# Patient Record
Sex: Female | Born: 1965 | Race: Black or African American | Hispanic: No | Marital: Single | Smoking: Never smoker
Health system: Southern US, Community
[De-identification: ages and names within clinical notes are randomized; demographics above are authoritative.]

## PROBLEM LIST (undated history)

## (undated) DIAGNOSIS — R011 Cardiac murmur, unspecified: Secondary | ICD-10-CM

## (undated) DIAGNOSIS — Z87442 Personal history of urinary calculi: Secondary | ICD-10-CM

## (undated) DIAGNOSIS — E785 Hyperlipidemia, unspecified: Secondary | ICD-10-CM

## (undated) DIAGNOSIS — D649 Anemia, unspecified: Secondary | ICD-10-CM

## (undated) DIAGNOSIS — D219 Benign neoplasm of connective and other soft tissue, unspecified: Secondary | ICD-10-CM

## (undated) DIAGNOSIS — F419 Anxiety disorder, unspecified: Secondary | ICD-10-CM

## (undated) DIAGNOSIS — J302 Other seasonal allergic rhinitis: Secondary | ICD-10-CM

## (undated) DIAGNOSIS — N938 Other specified abnormal uterine and vaginal bleeding: Secondary | ICD-10-CM

## (undated) DIAGNOSIS — M1990 Unspecified osteoarthritis, unspecified site: Secondary | ICD-10-CM

## (undated) DIAGNOSIS — T7840XA Allergy, unspecified, initial encounter: Secondary | ICD-10-CM

## (undated) DIAGNOSIS — B019 Varicella without complication: Secondary | ICD-10-CM

## (undated) HISTORY — PX: TONSILLECTOMY AND ADENOIDECTOMY: SUR1326

## (undated) HISTORY — DX: Cardiac murmur, unspecified: R01.1

## (undated) HISTORY — PX: DILATION AND CURETTAGE OF UTERUS: SHX78

## (undated) HISTORY — DX: Unspecified osteoarthritis, unspecified site: M19.90

## (undated) HISTORY — DX: Varicella without complication: B01.9

## (undated) HISTORY — PX: ENDOMETRIAL ABLATION: SHX621

## (undated) HISTORY — PX: PARTIAL HYSTERECTOMY: SHX80

## (undated) HISTORY — DX: Hyperlipidemia, unspecified: E78.5

## (undated) HISTORY — DX: Anxiety disorder, unspecified: F41.9

## (undated) HISTORY — DX: Other seasonal allergic rhinitis: J30.2

## (undated) HISTORY — DX: Other specified abnormal uterine and vaginal bleeding: N93.8

## (undated) HISTORY — DX: Anemia, unspecified: D64.9

## (undated) HISTORY — DX: Morbid (severe) obesity due to excess calories: E66.01

## (undated) HISTORY — PX: ABDOMINAL HYSTERECTOMY: SHX81

## (undated) HISTORY — PX: WISDOM TOOTH EXTRACTION: SHX21

## (undated) HISTORY — DX: Allergy, unspecified, initial encounter: T78.40XA

---

## 1988-05-29 HISTORY — PX: LAPAROSCOPY: SHX197

## 1997-11-04 ENCOUNTER — Emergency Department (HOSPITAL_COMMUNITY): Admission: EM | Admit: 1997-11-04 | Discharge: 1997-11-04 | Payer: Self-pay | Admitting: Internal Medicine

## 1998-04-04 ENCOUNTER — Emergency Department (HOSPITAL_COMMUNITY): Admission: EM | Admit: 1998-04-04 | Discharge: 1998-04-04 | Payer: Self-pay | Admitting: Emergency Medicine

## 1998-05-28 ENCOUNTER — Emergency Department (HOSPITAL_COMMUNITY): Admission: EM | Admit: 1998-05-28 | Discharge: 1998-05-28 | Payer: Self-pay | Admitting: Emergency Medicine

## 1998-08-16 ENCOUNTER — Emergency Department (HOSPITAL_COMMUNITY): Admission: EM | Admit: 1998-08-16 | Discharge: 1998-08-16 | Payer: Self-pay | Admitting: Emergency Medicine

## 2000-12-08 ENCOUNTER — Emergency Department (HOSPITAL_COMMUNITY): Admission: EM | Admit: 2000-12-08 | Discharge: 2000-12-08 | Payer: Self-pay

## 2001-05-23 ENCOUNTER — Other Ambulatory Visit: Admission: RE | Admit: 2001-05-23 | Discharge: 2001-05-23 | Payer: Self-pay | Admitting: Obstetrics and Gynecology

## 2001-05-24 ENCOUNTER — Encounter: Payer: Self-pay | Admitting: Obstetrics and Gynecology

## 2001-05-24 ENCOUNTER — Encounter: Admission: RE | Admit: 2001-05-24 | Discharge: 2001-05-24 | Payer: Self-pay | Admitting: Obstetrics and Gynecology

## 2009-06-24 ENCOUNTER — Emergency Department (HOSPITAL_COMMUNITY): Admission: EM | Admit: 2009-06-24 | Discharge: 2009-06-24 | Payer: Self-pay | Admitting: Emergency Medicine

## 2010-06-19 ENCOUNTER — Encounter: Payer: Self-pay | Admitting: Obstetrics and Gynecology

## 2010-06-24 ENCOUNTER — Inpatient Hospital Stay (HOSPITAL_COMMUNITY)
Admission: AD | Admit: 2010-06-24 | Discharge: 2010-06-25 | Payer: Self-pay | Source: Home / Self Care | Attending: Obstetrics and Gynecology | Admitting: Obstetrics and Gynecology

## 2010-06-24 LAB — URINALYSIS, ROUTINE W REFLEX MICROSCOPIC
Bilirubin Urine: NEGATIVE
Hgb urine dipstick: NEGATIVE
Ketones, ur: NEGATIVE mg/dL
Nitrite: NEGATIVE
Protein, ur: NEGATIVE mg/dL
Specific Gravity, Urine: >1.03 — ABNORMAL HIGH (ref 1.005–1.030)
Urine Glucose, Fasting: NEGATIVE mg/dL
Urobilinogen, UA: 0.2 mg/dL (ref 0.0–1.0)
pH: 5.5 (ref 5.0–8.0)

## 2010-06-24 LAB — DIFFERENTIAL
Basophils Absolute: 0 10*3/uL (ref 0.0–0.1)
Eosinophils Absolute: 0.1 10*3/uL (ref 0.0–0.7)
Lymphocytes Relative: 36 % (ref 12–46)
Lymphs Abs: 2.1 10*3/uL (ref 0.7–4.0)
Neutrophils Relative %: 56 % (ref 43–77)

## 2010-06-24 LAB — CBC
MCV: 76.5 fL — ABNORMAL LOW (ref 78.0–100.0)
Platelets: 426 10*3/uL — ABNORMAL HIGH (ref 150–400)
RBC: 4.63 MIL/uL (ref 3.87–5.11)
WBC: 6 10*3/uL (ref 4.0–10.5)

## 2010-06-24 LAB — WET PREP, GENITAL
Clue Cells Wet Prep HPF POC: NONE SEEN
Trich, Wet Prep: NONE SEEN
Yeast Wet Prep HPF POC: NONE SEEN

## 2010-06-24 LAB — HCG, SERUM, QUALITATIVE: Preg, Serum: NEGATIVE

## 2010-06-24 LAB — POCT PREGNANCY, URINE: Preg Test, Ur: NEGATIVE

## 2010-06-25 LAB — GC/CHLAMYDIA PROBE AMP, GENITAL: Chlamydia, DNA Probe: NEGATIVE

## 2010-08-10 ENCOUNTER — Ambulatory Visit: Payer: Self-pay | Admitting: Obstetrics and Gynecology

## 2010-08-14 LAB — GC/CHLAMYDIA PROBE AMP, GENITAL
Chlamydia, DNA Probe: NEGATIVE
GC Probe Amp, Genital: NEGATIVE

## 2010-08-14 LAB — WET PREP, GENITAL
Trich, Wet Prep: NONE SEEN
Yeast Wet Prep HPF POC: NONE SEEN

## 2010-08-14 LAB — URINALYSIS, ROUTINE W REFLEX MICROSCOPIC
Glucose, UA: NEGATIVE mg/dL
Nitrite: NEGATIVE
Protein, ur: NEGATIVE mg/dL
Urobilinogen, UA: 1 mg/dL (ref 0.0–1.0)

## 2010-08-25 ENCOUNTER — Other Ambulatory Visit: Payer: Self-pay | Admitting: Obstetrics and Gynecology

## 2010-08-25 ENCOUNTER — Ambulatory Visit: Payer: Self-pay | Admitting: Obstetrics and Gynecology

## 2010-08-25 ENCOUNTER — Other Ambulatory Visit: Payer: Self-pay | Admitting: Family Medicine

## 2010-08-25 ENCOUNTER — Other Ambulatory Visit (HOSPITAL_COMMUNITY)
Admission: RE | Admit: 2010-08-25 | Discharge: 2010-08-25 | Disposition: A | Discharge status: Home / Self Care | Payer: Self-pay | Source: Ambulatory Visit | Attending: Obstetrics and Gynecology | Admitting: Obstetrics and Gynecology

## 2010-08-25 DIAGNOSIS — N949 Unspecified condition associated with female genital organs and menstrual cycle: Secondary | ICD-10-CM | POA: Insufficient documentation

## 2010-08-25 DIAGNOSIS — N938 Other specified abnormal uterine and vaginal bleeding: Secondary | ICD-10-CM | POA: Insufficient documentation

## 2010-08-25 DIAGNOSIS — N83209 Unspecified ovarian cyst, unspecified side: Secondary | ICD-10-CM

## 2010-08-25 DIAGNOSIS — D259 Leiomyoma of uterus, unspecified: Secondary | ICD-10-CM

## 2010-08-25 DIAGNOSIS — Z01419 Encounter for gynecological examination (general) (routine) without abnormal findings: Secondary | ICD-10-CM

## 2010-08-25 DIAGNOSIS — Z124 Encounter for screening for malignant neoplasm of cervix: Secondary | ICD-10-CM

## 2010-08-26 ENCOUNTER — Encounter: Payer: Self-pay | Admitting: Obstetrics and Gynecology

## 2010-08-26 LAB — CONVERTED CEMR LAB
Trich, Wet Prep: NONE SEEN
Yeast Wet Prep HPF POC: NONE SEEN

## 2010-08-26 NOTE — Progress Notes (Unsigned)
NAME:  Jenny Cohen, Jenny Cohen NO.:  [id number]  MEDICAL RECORD NO.:  [id number]           PATIENT TYPE:  A  LOCATION:  [facility]                   FACILITY:  [facility]  PHYSICIAN:  Argentina Donovan, MD        DATE OF BIRTH:  June 02, 1965  DATE OF SERVICE:  08/25/2010                                 CLINIC NOTE  The patient is a 45 year old African American female, gravida 1, para 1- 0-0-1,who had a C-section 16 years ago, was living down here, then moved up to [place] and has just come back.  She was seen in January in the emergency room at [facility] with severe abdominal pain.  They did an ultrasound, found a 15-week size uterus.  Her periods have always been heavy and she also had a complex right ovarian cyst at the time. Recently the pain has gotten much worse, especially following her periods she is borderline anemic, but has been on iron with vitamin C for about a year and had a hemoglobin of 11 when she was in the [facility]. Today, we found her abdomen soft, flat, nontender.  No masses or organomegaly.  External genitalia is normal.  BUS within normal limits. Vagina is clean with a positive whiff.  Cervix is clean and nulliparous. The uterus sounded to a depth of 10 cm and endometrial biopsy was then taken as well as a Pap smear.  The patient will be back in 2 weeks to review the biopsy as well as the followup ultrasound and checked on that ovarian cyst.  I am having her get the papers for financial aid today to sell out as when she comes back and this will probably have her to see one of the surgeons.  IMPRESSION:  Fibroid uterus with menorrhagia and abdominopelvic pain especially on the right and complex right ovarian cyst.          ______________________________ Argentina Donovan, MD    PR/MEDQ  D:  08/25/2010  T:  08/26/2010  Job:  [id number]

## 2010-08-31 ENCOUNTER — Ambulatory Visit (HOSPITAL_COMMUNITY)
Admission: RE | Admit: 2010-08-31 | Discharge: 2010-08-31 | Disposition: A | Discharge status: Home / Self Care | Payer: Self-pay | Source: Ambulatory Visit | Attending: Family Medicine | Admitting: Family Medicine

## 2010-08-31 DIAGNOSIS — N949 Unspecified condition associated with female genital organs and menstrual cycle: Secondary | ICD-10-CM | POA: Insufficient documentation

## 2010-08-31 DIAGNOSIS — N938 Other specified abnormal uterine and vaginal bleeding: Secondary | ICD-10-CM | POA: Insufficient documentation

## 2010-08-31 DIAGNOSIS — D259 Leiomyoma of uterus, unspecified: Secondary | ICD-10-CM | POA: Insufficient documentation

## 2010-08-31 DIAGNOSIS — N83209 Unspecified ovarian cyst, unspecified side: Secondary | ICD-10-CM | POA: Insufficient documentation

## 2010-09-12 ENCOUNTER — Ambulatory Visit: Payer: Self-pay | Admitting: Obstetrics and Gynecology

## 2010-09-12 DIAGNOSIS — N949 Unspecified condition associated with female genital organs and menstrual cycle: Secondary | ICD-10-CM

## 2010-09-13 NOTE — Group Therapy Note (Signed)
NAME:  Jenny Cohen, Jenny Cohen NO.:  [id number]  MEDICAL RECORD NO.:  [id number]           PATIENT TYPE:  A  LOCATION[facility]                   FACILITY:  [facility]  PHYSICIAN:  Argentina Donovan, MD        DATE OF BIRTH:  [birth year]  DATE OF SERVICE:  09/12/2010                                 CLINIC NOTE  This is a followup visit of 45 year old African American female, gravida 1, para 1-0-0-1, who had a C-section 16 years ago.  She had an ultrasound done sometime ago and had been followed for extremely heavy bleeding and periods with anemia.  She has been on iron for quite some time.  We did an endometrial biopsy, which was normal and the uterus sounded to 10 cm in depth.  She had a Pap smear, which was normal and an ultrasound followup, which showed a previous complex ovarian cyst, resolved.  The uterus measured 13 x 5 with this one and 15 with the previous one, so the uterus falls well within the limits for endometrial ablation.  I have talked to her about that and gave her some information on that, and I think she would be a good candidate for a hysteroscopy. I am going to have her see one of the surgeons.  The decision on what to do will be left up to him for her and we will have her come back.  She is filling out the papers for financial aid today.          ______________________________ Argentina Donovan, MD    PR/MEDQ  D:  09/12/2010  T:  09/13/2010  Job:  [id number]

## 2010-09-21 ENCOUNTER — Ambulatory Visit (INDEPENDENT_AMBULATORY_CARE_PROVIDER_SITE_OTHER): Payer: Self-pay | Admitting: Obstetrics and Gynecology

## 2010-09-21 DIAGNOSIS — D259 Leiomyoma of uterus, unspecified: Secondary | ICD-10-CM

## 2010-09-21 DIAGNOSIS — N949 Unspecified condition associated with female genital organs and menstrual cycle: Secondary | ICD-10-CM

## 2010-09-21 NOTE — H&P (Signed)
Jenny Cohen, Jenny Cohen NO.:  [id number]  MEDICAL RECORD NO.:  [id number]           PATIENT TYPE:  A  LOCATION:  [facility]                   FACILITY:  [facility]  PHYSICIAN:  Catalina Antigua, MD     DATE OF BIRTH:  01-11-1966  DATE OF SERVICE:                          PRE-OP HISTORY & PHYSICAL  This is a 45 year old G1, P108 with LMP of September 09, 2010 who presents today for surgical consult for management of her dysfunctional uterine bleeding.  The patient has had longstanding history of heavy vaginal bleeding which never required any hospitalization for blood transfusion and presents today requesting medical management.  The patient had endometrial biopsy performed on March 29 which revealed a benign proliferative endometrium.  No atypia or hyperplasia or malignancy identified.  The patient is currently without any other complaints.  PAST MEDICAL HISTORY:  Significant for anemia.  PAST SURGICAL HISTORY:  She has had a C-section in 1995 and a laparoscopy with possible cystectomy in 1993.  PAST OB HISTORY:  She has had 1 C-section.  PAST GYN HISTORY:  She does have fibroid.  She does have a history of ovarian cyst.  She does have a history of abnormal Pap smear in 2008 which was treated with repeat Pap and her most recent Pap in March 2012 was normal.  FAMILY HISTORY:  Significant for hypertension and no family history of cancer in the family.  SOCIAL HISTORY:  She denies smoking or other use of illicit drugs.  She does drink once or twice per week.  REVIEW OF SYSTEMS:  Significant for occasional dysmenorrhea.  PHYSICAL EXAMINATION:  VITAL SIGNS:  Her blood pressure is 107/67, pulse of 74, weight of 117.6 kg, height of 5 feet 5 inches. LUNGS:  Clear to auscultation bilaterally. HEART:  Regular rate and rhythm. ABDOMEN:  Soft, nontender, nondistended but obese. PELVIC:  Normal-appearing vaginal mucosa, normal appearing cervix.  No abnormal bleeding or  discharge.  The patient also had an ultrasound on September 08, 2010 which demonstrated uterus measuring 13.5 x 9.5 x 6.8 cm with multiple uterine fibroids. Endometrium was 1.5 cm but with a probable submucosal fibroid or polyp which measured 2 x 2 x 1.6 cm.  She had a normal right and left ovary and no free fluid.  ASSESSMENT/PLAN:  This is a 45 year old G1, P1 with history of dysfunctional uterine bleeding who presents today requesting medical intervention.  The patient was counseled regarding birth control options for which she is not interested in.  The patient was previously counseled by Dr. Joseph Art with endometrial ablation and this is what she desires.  Risks, benefits, and alternatives of the procedures were explained.  The patient verbalized understanding.  All questions were answered.  The patient will be scheduled for endometrial ablation and will be contacted by surgical scheduler for the day and time of the surgery.          ______________________________ Catalina Antigua, MD    PC/MEDQ  D:  09/21/2010  T:  09/21/2010  Job:  [id number]

## 2010-10-11 ENCOUNTER — Ambulatory Visit (HOSPITAL_COMMUNITY)
Admission: RE | Admit: 2010-10-11 | Discharge: 2010-10-11 | Disposition: A | Discharge status: Home / Self Care | Payer: Self-pay | Source: Ambulatory Visit | Attending: Obstetrics and Gynecology | Admitting: Obstetrics and Gynecology

## 2010-10-11 DIAGNOSIS — N938 Other specified abnormal uterine and vaginal bleeding: Secondary | ICD-10-CM | POA: Insufficient documentation

## 2010-10-11 DIAGNOSIS — N949 Unspecified condition associated with female genital organs and menstrual cycle: Secondary | ICD-10-CM | POA: Insufficient documentation

## 2010-10-11 LAB — CBC
Platelets: 412 10*3/uL — ABNORMAL HIGH (ref 150–400)
RBC: 4.59 MIL/uL (ref 3.87–5.11)
RDW: 17.2 % — ABNORMAL HIGH (ref 11.5–15.5)
WBC: 4.7 10*3/uL (ref 4.0–10.5)

## 2010-10-11 LAB — PREGNANCY, URINE: Preg Test, Ur: NEGATIVE

## 2010-10-12 NOTE — Op Note (Signed)
  Jenny Cohen, ZEITER              ACCOUNT NO.:  [id number]  MEDICAL RECORD NO.:  [id number]           PATIENT TYPE:  O  LOCATION:  [facility]                          [facility]  PHYSICIAN:  Catalina Antigua, MD     DATE OF BIRTH:  11-Jun-1965  DATE OF PROCEDURE:  10/11/2010 DATE OF DISCHARGE:                              OPERATIVE REPORT   PREOPERATIVE DIAGNOSIS:  A 45 year old G1, P1 with history of dysfunctional uterine bleeding requesting surgical intervention with endometrial ablation.  POSTOPERATIVE DIAGNOSIS:  A 45 year old G1, P1 with history of dysfunctional uterine bleeding requesting surgical intervention with endometrial ablation.  PROCEDURE:  Endometrial ablation with hysteroscopy using Thermachoice.  SURGEON:  Catalina Antigua, MD  ASSISTANT:  None.  ANESTHESIA:  General.  ESTIMATED BLOOD LOSS:  Minimal.  FINDINGS:  Uterine cavity sounded to 8 cm.  Both ostia visualized.  COMPLICATIONS:  None.  After informed consent was obtained, the patient was taken to the operating room where anesthesia was induced and found to be adequate. The patient was placed in dorsal lithotomy position and prepped and draped in the usual sterile fashion.  A speculum was placed in the vagina.  Anterior lip of cervix was grasped with single-tooth tenaculum. The cervix was injected in its circumference with a 1%  Nesacaine solution.  The uterine cavity was sounded to 8 cm.  A 2.7 hysteroscope was then introduced to the uterine cavity with the above- noted findings.  The Thermachoice device was then primed and inserted into the uterine cavity and the standard instructions were followed.  When a pressure of 185 mmHg was achieved, endometrial ablation was performed at temperature of 87 degrees for 8 minutes.  Following the ablation cycle, the 2.7 hysteroscope was then reintroduced into the uterine cavity with endometrial lining completely blanched.  All instruments were removed from the  patient's vagina.  Hemostasis was visualized at the tenaculum site.  The patient tolerated the procedure well.  Sponge, lap, and needle count was correct x2.  The patient was transferred to the recovery room in stable condition.     Catalina Antigua, MD     PC/MEDQ  D:  10/11/2010  T:  10/12/2010  Job:  [id number]  Electronically Signed by Catalina Antigua  on 10/12/2010 05:38:41 AM

## 2010-11-02 ENCOUNTER — Ambulatory Visit: Payer: Self-pay | Admitting: Obstetrics and Gynecology

## 2010-11-02 DIAGNOSIS — N949 Unspecified condition associated with female genital organs and menstrual cycle: Secondary | ICD-10-CM

## 2010-11-02 DIAGNOSIS — Z09 Encounter for follow-up examination after completed treatment for conditions other than malignant neoplasm: Secondary | ICD-10-CM

## 2010-11-03 NOTE — Group Therapy Note (Signed)
Jenny Cohen, MCDADE NO.:  [id number]  MEDICAL RECORD NO.:  [id number]           PATIENT TYPE:  A  [facility]ATION[facility]                   [facility]  PHYSICIAN:  Catalina Antigua, MD     DATE OF BIRTH:  Sep 05, 1965  DATE OF SERVICE:  11/02/2010                                 CLINIC NOTE  This is a 45 year old G1, P1 with history of dysfunctional uterine bleeding who is status post endometrial ablation with ThermaChoice on Oct 11, 2010, who presents today for postoperative check.  The patient is currently without any complaints and is content with the results of the procedure thus far.  States that she experienced approximately severe cramping pain for the first 48 hours and has had light spotting since the procedure.  The patient states that approximately a week ago, she experienced 2 days of heavier flow of vaginal bleeding, but that subsequently changed to spotting.  Her dysmenorrhea has also seemed to resolve.  On physical exam; her blood pressure is 105/73, pulse of 75, weight 117.4 kg, height of 5 feet 5 inches, temperature is 97.2.  Abdomen is soft, nontender, nondistended.  Pelvic examination showed small anteverted uterus.  No palpable adnexal masses or tenderness.  ASSESSMENT AND PLAN:  This is a 45 year old G1, P1 who is status post ThermaChoice endometrial ablation on May 15 who presents today for postoperative check.  The patient was asked to keep track of her menstrual calendar and to return if her vaginal bleeding pattern became problematic again.  The patient is otherwise due for annual exam in March.  The patient will therefore return at that time or p.r.n.          ______________________________ Catalina Antigua, MD    PC/MEDQ  D:  11/02/2010  T:  11/03/2010  Job:  [id number]

## 2011-02-23 ENCOUNTER — Telehealth: Payer: Self-pay | Admitting: *Deleted

## 2011-02-23 DIAGNOSIS — N898 Other specified noninflammatory disorders of vagina: Secondary | ICD-10-CM

## 2011-02-23 MED ORDER — FLUCONAZOLE 150 MG PO TABS: 150.0000 mg | ORAL_TABLET | Freq: Once | ORAL | Status: AC

## 2011-02-23 NOTE — Telephone Encounter (Signed)
Patient left message stating that she has a yeast infection. She has white discharge and is very itchy. She can't use monistat due to a reaction. She would like to have Diflucan called in to her pharmacy. Pt last seen in clinic on 11/02/10.

## 2011-02-23 NOTE — Telephone Encounter (Signed)
Spoke with Dr. Adrian Blackwater about patients symptoms he gave verbal order for Diflucan 150mg  #1 with no refills. Prescription eprescribed.

## 2011-02-23 NOTE — Telephone Encounter (Signed)
Pt notified that rx would be ready for her to pickup at her pharmacy later today.

## 2011-08-14 ENCOUNTER — Telehealth: Payer: Self-pay | Admitting: *Deleted

## 2011-08-14 MED ORDER — FLUCONAZOLE 150 MG PO TABS: 150.0000 mg | ORAL_TABLET | Freq: Once | ORAL | Status: DC

## 2011-08-14 NOTE — Telephone Encounter (Signed)
Pt left message requesting Diflucan for yeast infection. She has recently taken antibiotic for ear infection.

## 2011-08-14 NOTE — Telephone Encounter (Signed)
Returned pt call- informed that I can send Rx to her pharmacy. She may also use hydrocortisone cream to external genitalia if desired. I verified that pt is allergic to Monistat and asked if she has had problems with Diflucan. She stated that Diflucan is the only medication that she can take without getting side effects or problems. Pt voiced understanding.

## 2012-03-07 ENCOUNTER — Ambulatory Visit: Payer: Self-pay | Admitting: Obstetrics & Gynecology

## 2012-03-27 ENCOUNTER — Other Ambulatory Visit: Payer: Self-pay | Admitting: Obstetrics and Gynecology

## 2012-03-27 ENCOUNTER — Ambulatory Visit: Payer: Self-pay | Admitting: Obstetrics & Gynecology

## 2012-03-27 DIAGNOSIS — Z1231 Encounter for screening mammogram for malignant neoplasm of breast: Secondary | ICD-10-CM

## 2012-04-12 ENCOUNTER — Ambulatory Visit: Payer: Self-pay

## 2012-04-17 ENCOUNTER — Ambulatory Visit: Payer: Self-pay | Admitting: Obstetrics & Gynecology

## 2012-05-02 ENCOUNTER — Ambulatory Visit: Payer: Self-pay

## 2012-05-20 ENCOUNTER — Ambulatory Visit: Payer: Self-pay | Admitting: Obstetrics & Gynecology

## 2013-01-17 ENCOUNTER — Other Ambulatory Visit: Payer: Self-pay | Admitting: Obstetrics & Gynecology

## 2013-07-20 ENCOUNTER — Emergency Department (HOSPITAL_COMMUNITY): Payer: Self-pay

## 2013-07-20 ENCOUNTER — Encounter (HOSPITAL_COMMUNITY): Payer: Self-pay | Admitting: Emergency Medicine

## 2013-07-20 ENCOUNTER — Emergency Department (HOSPITAL_COMMUNITY)
Admission: EM | Admit: 2013-07-20 | Discharge: 2013-07-20 | Disposition: A | Discharge status: Home / Self Care | Payer: Self-pay | Attending: Emergency Medicine | Admitting: Emergency Medicine

## 2013-07-20 DIAGNOSIS — R110 Nausea: Secondary | ICD-10-CM | POA: Insufficient documentation

## 2013-07-20 DIAGNOSIS — R350 Frequency of micturition: Secondary | ICD-10-CM | POA: Insufficient documentation

## 2013-07-20 DIAGNOSIS — R1031 Right lower quadrant pain: Secondary | ICD-10-CM | POA: Insufficient documentation

## 2013-07-20 DIAGNOSIS — R6883 Chills (without fever): Secondary | ICD-10-CM | POA: Insufficient documentation

## 2013-07-20 DIAGNOSIS — Z9889 Other specified postprocedural states: Secondary | ICD-10-CM | POA: Insufficient documentation

## 2013-07-20 DIAGNOSIS — R109 Unspecified abdominal pain: Secondary | ICD-10-CM

## 2013-07-20 DIAGNOSIS — R630 Anorexia: Secondary | ICD-10-CM | POA: Insufficient documentation

## 2013-07-20 DIAGNOSIS — Z3202 Encounter for pregnancy test, result negative: Secondary | ICD-10-CM | POA: Insufficient documentation

## 2013-07-20 DIAGNOSIS — Z8742 Personal history of other diseases of the female genital tract: Secondary | ICD-10-CM | POA: Insufficient documentation

## 2013-07-20 LAB — URINALYSIS, ROUTINE W REFLEX MICROSCOPIC
Bilirubin Urine: NEGATIVE
GLUCOSE, UA: NEGATIVE mg/dL
Hgb urine dipstick: NEGATIVE
Ketones, ur: NEGATIVE mg/dL
LEUKOCYTES UA: NEGATIVE
NITRITE: NEGATIVE
PROTEIN: NEGATIVE mg/dL
Specific Gravity, Urine: 1.023 (ref 1.005–1.030)
UROBILINOGEN UA: 1 mg/dL (ref 0.0–1.0)
pH: 7 (ref 5.0–8.0)

## 2013-07-20 LAB — COMPREHENSIVE METABOLIC PANEL
ALBUMIN: 3.1 g/dL — AB (ref 3.5–5.2)
ALT: 14 U/L (ref 0–35)
AST: 22 U/L (ref 0–37)
Alkaline Phosphatase: 107 U/L (ref 39–117)
BILIRUBIN TOTAL: 0.2 mg/dL — AB (ref 0.3–1.2)
BUN: 14 mg/dL (ref 6–23)
CHLORIDE: 101 meq/L (ref 96–112)
CO2: 24 mEq/L (ref 19–32)
CREATININE: 0.71 mg/dL (ref 0.50–1.10)
Calcium: 8.8 mg/dL (ref 8.4–10.5)
GFR calc Af Amer: >90 mL/min (ref >90)
GFR calc non Af Amer: >90 mL/min (ref >90)
Glucose, Bld: 130 mg/dL — ABNORMAL HIGH (ref 70–99)
Potassium: 3.7 mEq/L (ref 3.7–5.3)
Sodium: 137 mEq/L (ref 137–147)
Total Protein: 7.2 g/dL (ref 6.0–8.3)

## 2013-07-20 LAB — CBC WITH DIFFERENTIAL/PLATELET
BASOS ABS: 0.1 10*3/uL (ref 0.0–0.1)
Basophils Relative: 1 % (ref 0–1)
EOS ABS: 0.1 10*3/uL (ref 0.0–0.7)
Eosinophils Relative: 2 % (ref 0–5)
HEMATOCRIT: 31.9 % — AB (ref 36.0–46.0)
Hemoglobin: 9.8 g/dL — ABNORMAL LOW (ref 12.0–15.0)
LYMPHS ABS: 2.4 10*3/uL (ref 0.7–4.0)
Lymphocytes Relative: 35 % (ref 12–46)
MCH: 22.3 pg — AB (ref 26.0–34.0)
MCHC: 30.7 g/dL (ref 30.0–36.0)
MCV: 72.5 fL — AB (ref 78.0–100.0)
MONOS PCT: 7 % (ref 3–12)
Monocytes Absolute: 0.5 10*3/uL (ref 0.1–1.0)
NEUTROS ABS: 3.7 10*3/uL (ref 1.7–7.7)
Neutrophils Relative %: 55 % (ref 43–77)
Platelets: 571 10*3/uL — ABNORMAL HIGH (ref 150–400)
RBC: 4.4 MIL/uL (ref 3.87–5.11)
RDW: 17.4 % — AB (ref 11.5–15.5)
WBC: 6.8 10*3/uL (ref 4.0–10.5)

## 2013-07-20 LAB — POC URINE PREG, ED: PREG TEST UR: NEGATIVE

## 2013-07-20 LAB — WET PREP, GENITAL
Clue Cells Wet Prep HPF POC: NONE SEEN
Trich, Wet Prep: NONE SEEN
Yeast Wet Prep HPF POC: NONE SEEN

## 2013-07-20 MED ORDER — ONDANSETRON HCL 4 MG/2ML IJ SOLN
4.0000 mg | Freq: Once | INTRAMUSCULAR | Status: AC
  Administered 2013-07-20: 4 mg via INTRAVENOUS
  Filled 2013-07-20: qty 2

## 2013-07-20 MED ORDER — MORPHINE SULFATE 4 MG/ML IJ SOLN
4.0000 mg | Freq: Once | INTRAMUSCULAR | Status: AC
  Administered 2013-07-20: 4 mg via INTRAVENOUS
  Filled 2013-07-20: qty 1

## 2013-07-20 MED ORDER — HYDROCODONE-ACETAMINOPHEN 5-325 MG PO TABS: 1.0000 | ORAL_TABLET | ORAL | Status: DC | PRN

## 2013-07-20 MED ORDER — ONDANSETRON HCL 8 MG PO TABS: 8.0000 mg | ORAL_TABLET | Freq: Three times a day (TID) | ORAL | Status: DC | PRN

## 2013-07-20 MED ORDER — IOHEXOL 300 MG/ML  SOLN
50.0000 mL | Freq: Once | INTRAMUSCULAR | Status: AC | PRN
  Administered 2013-07-20: 50 mL via ORAL

## 2013-07-20 MED ORDER — FERROUS SULFATE 325 (65 FE) MG PO TABS: 325.0000 mg | ORAL_TABLET | Freq: Every day | ORAL | Status: DC

## 2013-07-20 MED ORDER — IOHEXOL 300 MG/ML  SOLN
100.0000 mL | Freq: Once | INTRAMUSCULAR | Status: AC | PRN
  Administered 2013-07-20: 100 mL via INTRAVENOUS

## 2013-07-20 NOTE — ED Provider Notes (Signed)
6:49 AM Patient signed out to me by Barton Dubois, PA-C.  Patient with RLQ pain, nausea, anorexia.  Concern initially for appendicitis, CT scan negative.  Pelvic exam performed by Schinlever, found to have midline and right adnexal tenderness, [place] pending.  If normal, pt to be discharged home with pain and nausea medication.   7:18 AM Patient reports pain is controlled at this time.  Abdomen soft, nondistended, TTP suprapubic, RLQ tender.    7:48 AM Patient discussed with Dr Tawnya Crook.  I spoke with radiologist regarding the CT and [place], states it is normal to not be able to visualize ovaries on CT and that no mention means that there is no mass. Does not recommend any further studies.  7:59 AM Discussed results with patient.  Advised close follow up with her gyn.  She has appt March 4 but will call tomorrow to be seen sooner.  Will return to ED in [place] or directly to [facility] for worsening symptoms.  Doubt ovarian torsion as this has been ongoing and gradually worsening over 4 days.  WBC count normal and reported pelvic exam was without concerning discharge or clinical diagnosis of PID.  Wet prep with only few WBC. No mass on imaging.  Unlikely to have tuboovarian abscess.  Pain may be related to fibroids and/or current menses.  D/C home with medications and instructions as printed by [employer], PA-C.    Discussed results, findings, treatment, and follow up  with patient.  Pt given return precautions.  Pt verbalizes understanding and agrees with plan.      Results for orders placed during the hospital encounter of 07/20/13  WET PREP, GENITAL      Result Value Ref Range   Yeast Wet Prep HPF POC NONE SEEN  NONE SEEN   Trich, Wet Prep NONE SEEN  NONE SEEN   Clue Cells Wet Prep HPF POC NONE SEEN  NONE SEEN   WBC, Wet Prep HPF POC FEW (*) NONE SEEN  CBC WITH DIFFERENTIAL      Result Value Ref Range   WBC 6.8  4.0 - 10.5 K/uL   RBC 4.40  3.87 - 5.11 MIL/uL   Hemoglobin 9.8 (*) 12.0 -  15.0 g/dL   HCT 31.9 (*) 36.0 - 46.0 %   MCV 72.5 (*) 78.0 - 100.0 fL   MCH 22.3 (*) 26.0 - 34.0 pg   MCHC 30.7  30.0 - 36.0 g/dL   RDW 17.4 (*) 11.5 - 15.5 %   Platelets 571 (*) 150 - 400 K/uL   Neutrophils Relative % 55  43 - 77 %   Lymphocytes Relative 35  12 - 46 %   Monocytes Relative 7  3 - 12 %   Eosinophils Relative 2  0 - 5 %   Basophils Relative 1  0 - 1 %   Neutro Abs 3.7  1.7 - 7.7 K/uL   Lymphs Abs 2.4  0.7 - 4.0 K/uL   Monocytes Absolute 0.5  0.1 - 1.0 K/uL   Eosinophils Absolute 0.1  0.0 - 0.7 K/uL   Basophils Absolute 0.1  0.0 - 0.1 K/uL   Smear Review LARGE PLATELETS PRESENT    COMPREHENSIVE METABOLIC PANEL      Result Value Ref Range   Sodium 137  137 - 147 mEq/L   Potassium 3.7  3.7 - 5.3 mEq/L   Chloride 101  96 - 112 mEq/L   CO2 24  19 - 32 mEq/L   Glucose, Bld 130 (*)  70 - 99 mg/dL   BUN 14  6 - 23 mg/dL   Creatinine, Ser 0.71  0.50 - 1.10 mg/dL   Calcium 8.8  8.4 - 10.5 mg/dL   Total Protein 7.2  6.0 - 8.3 g/dL   Albumin 3.1 (*) 3.5 - 5.2 g/dL   AST 22  0 - 37 U/L   ALT 14  0 - 35 U/L   Alkaline Phosphatase 107  39 - 117 U/L   Total Bilirubin 0.2 (*) 0.3 - 1.2 mg/dL   GFR calc non Af Amer >90  >90 mL/min   GFR calc Af Amer >90  >90 mL/min  URINALYSIS, ROUTINE W REFLEX MICROSCOPIC      Result Value Ref Range   Color, Urine YELLOW  YELLOW   APPearance CLEAR  CLEAR   Specific Gravity, Urine 1.023  1.005 - 1.030   pH 7.0  5.0 - 8.0   Glucose, UA NEGATIVE  NEGATIVE mg/dL   Hgb urine dipstick NEGATIVE  NEGATIVE   Bilirubin Urine NEGATIVE  NEGATIVE   Ketones, ur NEGATIVE  NEGATIVE mg/dL   Protein, ur NEGATIVE  NEGATIVE mg/dL   Urobilinogen, UA 1.0  0.0 - 1.0 mg/dL   Nitrite NEGATIVE  NEGATIVE   Leukocytes, UA NEGATIVE  NEGATIVE  POC URINE PREG, ED      Result Value Ref Range   Preg Test, Ur NEGATIVE  NEGATIVE   US Transvaginal Non-ob  07/20/2013   CLINICAL DATA:  Right lower quadrant and mid abdominal pain.  EXAM: TRANSABDOMINAL AND TRANSVAGINAL  ULTRASOUND OF PELVIS  TECHNIQUE: Both transabdominal and transvaginal ultrasound examinations of the pelvis were performed. Transabdominal technique was performed for global imaging of the pelvis including uterus, ovaries, adnexal regions, and pelvic cul-de-sac. It was necessary to proceed with endovaginal exam following the transabdominal exam to visualize the uterus and ovaries in greater detail.  COMPARISON:  CT ABD/PELVIS W CM dated 07/20/2013; US TRANSVAGINAL NON-OB dated 08/31/2010  FINDINGS: Uterus  Measurements: 16.2 x 8.4 x 11.7 cm. Multiple large fibroids seen, with a 4.8 cm fibroid at the right side of the fundus, a 4.3 cm fibroid at the mid fundus, posterior fibroids measuring 3.8 cm and 3.4 cm, an anterior fibroid measuring 4.0 cm, and a left-sided fibroid measuring 3.6 cm. A cluster of apparent nabothian cysts is noted at the cervix, measuring approximately 3.2 x 1.8 x 2.1 cm; no associated blood flow is seen on limited color Doppler evaluation.  Endometrium  Not visualized due to surrounding fibroids.  Right ovary  Not visualized due to the patient's habitus.  Left ovary  Not visualized due to the patient's habitus.  Other findings  No free fluid is seen within the pelvic cul-de-sac.  IMPRESSION: Significantly limited evaluation due to the patient's habitus. Enlarged uterus noted, with multiple large fibroids. Mildly prominent nabothian cysts noted at the cervix. Ovaries not visualized on this study.   Electronically Signed   By: Garald Balding M.D.   On: 07/20/2013 06:44   US Pelvis Complete  07/20/2013   CLINICAL DATA:  Right lower quadrant and mid abdominal pain.  EXAM: TRANSABDOMINAL AND TRANSVAGINAL ULTRASOUND OF PELVIS  TECHNIQUE: Both transabdominal and transvaginal ultrasound examinations of the pelvis were performed. Transabdominal technique was performed for global imaging of the pelvis including uterus, ovaries, adnexal regions, and pelvic cul-de-sac. It was necessary to proceed with  endovaginal exam following the transabdominal exam to visualize the uterus and ovaries in greater detail.  COMPARISON:  CT ABD/PELVIS W CM dated 07/20/2013;  US TRANSVAGINAL NON-OB dated 08/31/2010  FINDINGS: Uterus  Measurements: 16.2 x 8.4 x 11.7 cm. Multiple large fibroids seen, with a 4.8 cm fibroid at the right side of the fundus, a 4.3 cm fibroid at the mid fundus, posterior fibroids measuring 3.8 cm and 3.4 cm, an anterior fibroid measuring 4.0 cm, and a left-sided fibroid measuring 3.6 cm. A cluster of apparent nabothian cysts is noted at the cervix, measuring approximately 3.2 x 1.8 x 2.1 cm; no associated blood flow is seen on limited color Doppler evaluation.  Endometrium  Not visualized due to surrounding fibroids.  Right ovary  Not visualized due to the patient's habitus.  Left ovary  Not visualized due to the patient's habitus.  Other findings  No free fluid is seen within the pelvic cul-de-sac.  IMPRESSION: Significantly limited evaluation due to the patient's habitus. Enlarged uterus noted, with multiple large fibroids. Mildly prominent nabothian cysts noted at the cervix. Ovaries not visualized on this study.   Electronically Signed   By: Garald Balding M.D.   On: 07/20/2013 06:44   Ct Abdomen Pelvis W Contrast  07/20/2013   CLINICAL DATA:  Mid to right-sided abdominal cramping for 2 days. Nausea and chills.  EXAM: CT ABDOMEN AND PELVIS WITH CONTRAST  TECHNIQUE: Multidetector CT imaging of the abdomen and pelvis was performed using the standard protocol following bolus administration of intravenous contrast.  CONTRAST:  100 mL of Omnipaque 300 IV contrast  COMPARISON:  US TRANSVAGINAL NON-OB dated 08/31/2010  FINDINGS: Minimal atelectasis is noted near the left lung base.  The liver and spleen are unremarkable in appearance. The gallbladder is within normal limits. The pancreas and adrenal glands are unremarkable.  The kidneys are unremarkable in appearance. There is no evidence of hydronephrosis. No  renal or ureteral stones are seen. No perinephric stranding is appreciated.  No free fluid is identified. The small bowel is unremarkable in appearance. The stomach is within normal limits. No acute vascular abnormalities are seen.  The appendix is normal in caliber, extending along the medial aspect of the inferior tip of the liver, without evidence for appendicitis. The colon is unremarkable in appearance.  The bladder is mildly distended and grossly unremarkable in appearance. Multiple fibroids are seen within the uterus; the uterus is enlarged but otherwise grossly unremarkable. No inguinal lymphadenopathy is seen.  No acute osseous abnormalities are identified. A chronic disc protrusion is seen at L4-5, without evidence of significant foraminal narrowing.  IMPRESSION: 1. No acute abnormality seen to explain the patient's symptoms. 2. Multiple fibroids noted within the enlarged uterus.   Electronically Signed   By: Garald Balding M.D.   On: 07/20/2013 04:26        Clayton Bibles, PA-C 07/20/13 [phone]

## 2013-07-20 NOTE — ED Notes (Signed)
Pt returned from ct

## 2013-07-20 NOTE — ED Provider Notes (Signed)
CSN: [id number]     Arrival date & time 07/20/13  7124 History   First MD Initiated Contact with Patient 07/20/13 0121     Chief Complaint  Patient presents with  . Abdominal Pain     (Consider location/radiation/quality/duration/timing/severity/associated sxs/prior Treatment) HPI History provided by pt.   Pt developed pain in right lower abdomen at onset of menstrual cycle 4 days ago.  Has h/o dysmenorrhea but pain atypical and more severe.  Intermittent and random.  Radiates down RLE.  Associated w/ chills, anorexia, nausea and increased urinary frequency.  No known fever and denies change in bowels and other GU sx.  Past surgical history includes c-section and endometrial ablation.  No other pertinent PMH.  History reviewed. No pertinent past medical history. Past Surgical History  Procedure Laterality Date  . Cesarean section    . Endometrial ablation     No family history on file. History  Substance Use Topics  . Smoking status: Never Smoker   . Smokeless tobacco: Not on file  . Alcohol Use: No   OB History   Grav Para Term Preterm Abortions TAB SAB Ect Mult Living                 Review of Systems  All other systems reviewed and are negative.      Allergies  Miconazole nitrate and Vagisil  Home Medications   Current Outpatient Rx  Name  Route  Sig  Dispense  Refill  . Multiple Vitamin (MULTIVITAMIN) LIQD   Oral   Take 5 mLs by mouth daily.         . naproxen sodium (ANAPROX) 220 MG tablet   Oral   Take 440 mg by mouth 2 (two) times daily as needed (for pain).          BP 119/62  Pulse 77  Temp(Src) 98.2 F (36.8 C) (Oral)  Resp 16  Ht 5\' 5"  (1.651 m)  Wt 280 lb (127.007 kg)  BMI 46.59 kg/m2  SpO2 97%  LMP 07/14/2013 Physical Exam  Nursing note and vitals reviewed. Constitutional: She is oriented to person, place, and time. She appears well-developed and well-nourished. No distress.  HENT:  Head: Normocephalic and atraumatic.  Eyes:   Normal appearance  Neck: Normal range of motion.  Cardiovascular: Normal rate and regular rhythm.   Pulmonary/Chest: Effort normal and breath sounds normal. No respiratory distress.  Abdominal: Soft. Bowel sounds are normal. She exhibits no distension and no mass. There is no rebound.  Diffuse right-sided abd ttp, guarding in RLQ.  Positive rovsing's sign.   Genitourinary:  No CVA tenderness  Musculoskeletal: Normal range of motion.  Neurological: She is alert and oriented to person, place, and time.  Skin: Skin is warm and dry. No rash noted.  Psychiatric: She has a normal mood and affect. Her behavior is normal.    ED Course  Procedures (including critical care time) Labs Review Labs Reviewed  WET PREP, GENITAL - Abnormal; Notable for the following:    WBC, Wet Prep HPF POC FEW (*)    All other components within normal limits  CBC WITH DIFFERENTIAL - Abnormal; Notable for the following:    Hemoglobin 9.8 (*)    HCT 31.9 (*)    MCV 72.5 (*)    MCH 22.3 (*)    RDW 17.4 (*)    Platelets 571 (*)    All other components within normal limits  COMPREHENSIVE METABOLIC PANEL - Abnormal; Notable for the following:  Glucose, Bld 130 (*)    Albumin 3.1 (*)    Total Bilirubin 0.2 (*)    All other components within normal limits  GC/CHLAMYDIA PROBE AMP  URINALYSIS, ROUTINE W REFLEX MICROSCOPIC  POC URINE PREG, ED   Imaging Review Ct Abdomen Pelvis W Contrast  07/20/2013   CLINICAL DATA:  Mid to right-sided abdominal cramping for 2 days. Nausea and chills.  EXAM: CT ABDOMEN AND PELVIS WITH CONTRAST  TECHNIQUE: Multidetector CT imaging of the abdomen and pelvis was performed using the standard protocol following bolus administration of intravenous contrast.  CONTRAST:  100 mL of Omnipaque 300 IV contrast  COMPARISON:  US TRANSVAGINAL NON-OB dated 08/31/2010  FINDINGS: Minimal atelectasis is noted near the left lung base.  The liver and spleen are unremarkable in appearance. The  gallbladder is within normal limits. The pancreas and adrenal glands are unremarkable.  The kidneys are unremarkable in appearance. There is no evidence of hydronephrosis. No renal or ureteral stones are seen. No perinephric stranding is appreciated.  No free fluid is identified. The small bowel is unremarkable in appearance. The stomach is within normal limits. No acute vascular abnormalities are seen.  The appendix is normal in caliber, extending along the medial aspect of the inferior tip of the liver, without evidence for appendicitis. The colon is unremarkable in appearance.  The bladder is mildly distended and grossly unremarkable in appearance. Multiple fibroids are seen within the uterus; the uterus is enlarged but otherwise grossly unremarkable. No inguinal lymphadenopathy is seen.  No acute osseous abnormalities are identified. A chronic disc protrusion is seen at L4-5, without evidence of significant foraminal narrowing.  IMPRESSION: 1. No acute abnormality seen to explain the patient's symptoms. 2. Multiple fibroids noted within the enlarged uterus.   Electronically Signed   By: Garald Balding M.D.   On: 07/20/2013 04:26    EKG Interpretation   None       MDM   Final diagnoses:  None    47yo healthy F presents w/ RLQ pain x 4 days.  H/o dysmenorrhea but current pain atypical and more severe.  Afebrile, NAD, abd soft/non-distended, RLQ ttp w/ guarding, positive rovsing's sign, no peritoneal signs, mild R adnexal tenderness but otherwise unremarkable genitalia on exam.  Labs and CT abd/pelvis to r/o acute appendicitis pending.    CT unremarkable w/ exception of uterine fibroids.  Results discussed w/ pt.  Pt diagnosed w/ fibroids several years ago and has undergone uterine ablation d/t the associated pain.  Reports again that she has never had pain like this.   Pain currently improved but on repeat exam, stable tenderness RLQ and R suprapubic.  Transvaginal US pending.  [place], PA-C to  resume care.      Remer Macho, PA-C 07/20/13 [phone]

## 2013-07-20 NOTE — Discharge Instructions (Signed)
Take vicodin as prescribed for severe pain.  Do not drive within four hours of taking this medication (may cause drowsiness or confusion).   Take zofran as needed for nausea and iron supplement as prescribed.  Follow up with your primary care doctor.  You should return to the ER if you develop fever, worsening pain or uncontrolled vomiting.

## 2013-07-20 NOTE — ED Notes (Signed)
Patient transported to CT 

## 2013-07-20 NOTE — ED Notes (Signed)
Pt c/o mid to R abdominal cramping x 2 days, pt now radiating down R leg. +nausea, +chills.

## 2013-07-21 LAB — GC/CHLAMYDIA PROBE AMP
CT Probe RNA: NEGATIVE
GC Probe RNA: NEGATIVE

## 2013-07-21 NOTE — ED Provider Notes (Signed)
Medical screening examination/treatment/procedure(s) were performed by non-physician practitioner and as supervising physician I was immediately available for consultation/collaboration.    Johnna Acosta, MD 07/21/13 [phone]

## 2013-07-21 NOTE — ED Provider Notes (Signed)
Medical screening examination/treatment/procedure(s) were performed by non-physician practitioner and as supervising physician I was immediately available for consultation/collaboration.    Brailon Don D Davionne Mastrangelo, MD 07/21/13 0648 

## 2013-07-30 ENCOUNTER — Encounter: Payer: Self-pay | Admitting: Obstetrics & Gynecology

## 2013-07-30 ENCOUNTER — Ambulatory Visit (INDEPENDENT_AMBULATORY_CARE_PROVIDER_SITE_OTHER): Payer: Self-pay | Admitting: Obstetrics & Gynecology

## 2013-07-30 VITALS — BP 96/63 | HR 74 | Temp 98.6°F | Ht 65.0 in | Wt 282.8 lb

## 2013-07-30 DIAGNOSIS — Z0000 Encounter for general adult medical examination without abnormal findings: Secondary | ICD-10-CM

## 2013-07-30 DIAGNOSIS — D219 Benign neoplasm of connective and other soft tissue, unspecified: Secondary | ICD-10-CM

## 2013-07-30 DIAGNOSIS — D259 Leiomyoma of uterus, unspecified: Secondary | ICD-10-CM

## 2013-07-30 LAB — HEMOGLOBIN A1C
Hgb A1c MFr Bld: 5.8 % — ABNORMAL HIGH (ref <5.7)
Mean Plasma Glucose: 120 mg/dL — ABNORMAL HIGH (ref <117)

## 2013-07-30 LAB — TSH: TSH: 2.314 u[IU]/mL (ref 0.350–4.500)

## 2013-07-30 NOTE — Patient Instructions (Signed)
Hysterectomy Information  A hysterectomy is a surgery in which your uterus is removed. This surgery may be done to treat various medical problems. After the surgery, you will no longer have menstrual periods. The surgery will also make you unable to become pregnant (sterile). The fallopian tubes and ovaries can be removed (bilateral salpingo-oophorectomy) during this surgery as well.  REASONS FOR A HYSTERECTOMY  Persistent, abnormal bleeding.  Lasting (chronic) pelvic pain or infection.  The lining of the uterus (endometrium) starts growing outside the uterus (endometriosis).  The endometrium starts growing in the muscle of the uterus (adenomyosis).  The uterus falls down into the vagina (pelvic organ prolapse).  Noncancerous growths in the uterus (uterine fibroids) that cause symptoms.  Precancerous cells.  Cervical cancer or uterine cancer. TYPES OF HYSTERECTOMIES  Supracervical hysterectomy In this type, the top part of the uterus is removed, but not the cervix.  Total hysterectomy The uterus and cervix are removed.  Radical hysterectomy The uterus, the cervix, and the fibrous tissue that holds the uterus in place in the pelvis (parametrium) are removed. WAYS A HYSTERECTOMY CAN BE PERFORMED  Abdominal hysterectomy A large surgical cut (incision) is made in the abdomen. The uterus is removed through this incision.  Vaginal hysterectomy An incision is made in the vagina. The uterus is removed through this incision. There are no abdominal incisions.  Conventional laparoscopic hysterectomy Three or four small incisions are made in the abdomen. A thin, lighted tube with a camera (laparoscope) is inserted into one of the incisions. Other tools are put through the other incisions. The uterus is cut into small pieces. The small pieces are removed through the incisions, or they are removed through the vagina.  Laparoscopically assisted vaginal hysterectomy (LAVH) Three or four small  incisions are made in the abdomen. Part of the surgery is performed laparoscopically and part vaginally. The uterus is removed through the vagina.  Robot-assisted laparoscopic hysterectomy A laparoscope and other tools are inserted into 3 or 4 small incisions in the abdomen. A computer-controlled device is used to give the surgeon a 3D image and to help control the surgical instruments. This allows for more precise movements of surgical instruments. The uterus is cut into small pieces and removed through the incisions or removed through the vagina. RISKS AND COMPLICATIONS  Possible complications associated with this procedure include:  Bleeding and risk of blood transfusion. Tell your health care provider if you do not want to receive any blood products.  Blood clots in the legs or lung.  Infection.  Injury to surrounding organs.  Problems or side effects related to anesthesia.  Conversion to an abdominal hysterectomy from one of the other techniques. WHAT TO EXPECT AFTER A HYSTERECTOMY  You will be given pain medicine.  You will need to have someone with you for the first 3 5 days after you go home.  You will need to follow up with your surgeon in 2 4 weeks after surgery to evaluate your progress.  You may have early menopause symptoms such as hot flashes, night sweats, and insomnia.  If you had a hysterectomy for a problem that was not cancer or not a condition that could lead to cancer, then you no longer need Pap tests. However, even if you no longer need a Pap test, a regular exam is a good idea to make sure no other problems are starting. Document Released: 11/08/2000 Document Revised: 03/05/2013 Document Reviewed: 01/20/2013 [facility] Patient Information 2014 [place].

## 2013-07-30 NOTE — Progress Notes (Signed)
Subjective:    Jenny Cohen is a 48 y.o. female who presents for an annual exam. She complains of worsening pelvic pain, especially in the last 6 months. She was seen at [facility] ER and an u/s showed a grossly enlarged fibroid uterus. She was also noted to be anemic. She has heavy periods in spite of an endometrial ablation done several years ago.  The patient is sexually active. GYN screening history: last pap: was normal. The patient wears seatbelts: yes. The patient participates in regular exercise: yes. Has the patient ever been transfused or tattooed?: yes. (tattoos)  The patient reports that there is not domestic violence in her life.   Menstrual History: OB History   Grav Para Term Preterm Abortions TAB SAB Ect Mult Living   1 1 1              Menarche age: 80  Patient's last menstrual period was 07/14/2013.    The following portions of the patient's history were reviewed and updated as appropriate: allergies, current medications, past family history, past medical history, past social history, past surgical history and problem list.  Review of Systems A comprehensive review of systems was negative. Monogamous for 5 years, denies dyspareunia. Works from home.   Objective:    BP 96/63  Pulse 74  Temp(Src) 98.6 F (37 C)  Ht 5\' 5"  (1.651 m)  Wt 282 lb 12.8 oz (128.277 kg)  BMI 47.06 kg/m2  LMP 07/14/2013  General Appearance:    Alert, cooperative, no distress, appears stated age  Head:    Normocephalic, without obvious abnormality, atraumatic  Eyes:    PERRL, conjunctiva/corneas clear, EOM's intact, fundi    benign, both eyes  Ears:    Normal TM's and external ear canals, both ears  Nose:   Nares normal, septum midline, mucosa normal, no drainage    or sinus tenderness  Throat:   Lips, mucosa, and tongue normal; teeth and gums normal  Neck:   Supple, symmetrical, trachea midline, no adenopathy;    thyroid:  no enlargement/tenderness/nodules; no carotid   bruit or JVD  Back:      Symmetric, no curvature, ROM normal, no CVA tenderness  Lungs:     Clear to auscultation bilaterally, respirations unlabored  Chest Wall:    No tenderness or deformity   Heart:    Regular rate and rhythm, S1 and S2 normal, no murmur, rub   or gallop  Breast Exam:    No tenderness, masses, or nipple abnormality  Abdomen:     Soft, non-tender, bowel sounds active all four quadrants,    no masses, no organomegaly, morbidly obese  Genitalia:    Normal female without lesion, discharge or tenderness, uterus palpable to the U-1 position. Normal vagina/vulva. Adnexa not palpable     Extremities:   Extremities normal, atraumatic, no cyanosis or edema  Pulses:   2+ and symmetric all extremities  Skin:   Skin color, texture, turgor normal, no rashes or lesions  Lymph nodes:   Cervical, supraclavicular, and axillary nodes normal  Neurologic:   CNII-XII intact, normal strength, sensation and reflexes    throughout  .    Assessment:    Healthy female exam.  Pelvic pain with large fibroid uterus RBS of 130 with morbid obesity   Plan:     Breast self exam technique reviewed and patient encouraged to perform self-exam monthly. Thin prep Pap smear. with cotesting Check HBA1C and TSH Per her request I will schedule a TAH/BS, possible  BSO. We discussed risks of surgery, especially worse with her obesity. We also discussed her ovaries. She will do her soul-searching and let me know if she wants them removed or not. Rec weight loss

## 2013-07-30 NOTE — Progress Notes (Signed)
States here for annual exam. But states was having bad pelvic pain 2 weeks ago and went to ER at [facility] and they said she had so many fibroids. States had ablation 2012 for heavy bleeding and pain. States less bleeding now, but has regular cycles; but now pain getting worse. Also just finished doxycline for a staph infection of a pubic boil.

## 2013-09-15 ENCOUNTER — Encounter (HOSPITAL_COMMUNITY): Payer: Self-pay | Admitting: Pharmacist

## 2013-09-18 ENCOUNTER — Telehealth (HOSPITAL_COMMUNITY): Payer: Self-pay | Admitting: Obstetrics & Gynecology

## 2013-09-18 NOTE — Telephone Encounter (Signed)
Patient called to cancel her upcoming surgery for 09/29/13.  She is now being seen by [facility] in [facility] and is scheduled for surgery with them at [place] on 09/24/13.

## 2013-09-29 ENCOUNTER — Encounter (HOSPITAL_COMMUNITY): Admission: RE | Payer: Self-pay | Source: Ambulatory Visit

## 2013-09-29 ENCOUNTER — Inpatient Hospital Stay (HOSPITAL_COMMUNITY): Admission: RE | Admit: 2013-09-29 | Payer: Self-pay | Source: Ambulatory Visit | Admitting: Obstetrics & Gynecology

## 2013-09-29 SURGERY — HYSTERECTOMY, ABDOMINAL
Anesthesia: Choice | Site: Abdomen

## 2014-03-30 ENCOUNTER — Encounter: Payer: Self-pay | Admitting: Obstetrics & Gynecology

## 2014-06-02 ENCOUNTER — Emergency Department (HOSPITAL_COMMUNITY): Payer: Self-pay

## 2014-06-02 ENCOUNTER — Emergency Department (HOSPITAL_COMMUNITY)
Admission: EM | Admit: 2014-06-02 | Discharge: 2014-06-02 | Disposition: A | Discharge status: Home / Self Care | Payer: Self-pay | Attending: Emergency Medicine | Admitting: Emergency Medicine

## 2014-06-02 ENCOUNTER — Encounter (HOSPITAL_COMMUNITY): Payer: Self-pay | Admitting: Emergency Medicine

## 2014-06-02 DIAGNOSIS — Z862 Personal history of diseases of the blood and blood-forming organs and certain disorders involving the immune mechanism: Secondary | ICD-10-CM | POA: Insufficient documentation

## 2014-06-02 DIAGNOSIS — Y9301 Activity, walking, marching and hiking: Secondary | ICD-10-CM | POA: Insufficient documentation

## 2014-06-02 DIAGNOSIS — Y998 Other external cause status: Secondary | ICD-10-CM | POA: Insufficient documentation

## 2014-06-02 DIAGNOSIS — W01198A Fall on same level from slipping, tripping and stumbling with subsequent striking against other object, initial encounter: Secondary | ICD-10-CM | POA: Insufficient documentation

## 2014-06-02 DIAGNOSIS — R55 Syncope and collapse: Secondary | ICD-10-CM | POA: Insufficient documentation

## 2014-06-02 DIAGNOSIS — Z3202 Encounter for pregnancy test, result negative: Secondary | ICD-10-CM | POA: Insufficient documentation

## 2014-06-02 DIAGNOSIS — R059 Cough, unspecified: Secondary | ICD-10-CM

## 2014-06-02 DIAGNOSIS — R05 Cough: Secondary | ICD-10-CM | POA: Insufficient documentation

## 2014-06-02 DIAGNOSIS — R1110 Vomiting, unspecified: Secondary | ICD-10-CM | POA: Insufficient documentation

## 2014-06-02 DIAGNOSIS — T1490XA Injury, unspecified, initial encounter: Secondary | ICD-10-CM

## 2014-06-02 DIAGNOSIS — R000 Tachycardia, unspecified: Secondary | ICD-10-CM | POA: Insufficient documentation

## 2014-06-02 DIAGNOSIS — Y92009 Unspecified place in unspecified non-institutional (private) residence as the place of occurrence of the external cause: Secondary | ICD-10-CM | POA: Insufficient documentation

## 2014-06-02 DIAGNOSIS — S199XXA Unspecified injury of neck, initial encounter: Secondary | ICD-10-CM | POA: Insufficient documentation

## 2014-06-02 DIAGNOSIS — Z6841 Body Mass Index (BMI) 40.0 and over, adult: Secondary | ICD-10-CM | POA: Insufficient documentation

## 2014-06-02 DIAGNOSIS — Z8742 Personal history of other diseases of the female genital tract: Secondary | ICD-10-CM | POA: Insufficient documentation

## 2014-06-02 DIAGNOSIS — R6889 Other general symptoms and signs: Secondary | ICD-10-CM

## 2014-06-02 DIAGNOSIS — S0990XA Unspecified injury of head, initial encounter: Secondary | ICD-10-CM | POA: Insufficient documentation

## 2014-06-02 DIAGNOSIS — R509 Fever, unspecified: Secondary | ICD-10-CM | POA: Insufficient documentation

## 2014-06-02 LAB — CBC
HCT: 39.3 % (ref 36.0–46.0)
Hemoglobin: 12 g/dL (ref 12.0–15.0)
MCH: 23.5 pg — ABNORMAL LOW (ref 26.0–34.0)
MCHC: 30.5 g/dL (ref 30.0–36.0)
MCV: 77.1 fL — ABNORMAL LOW (ref 78.0–100.0)
Platelets: 495 10*3/uL — ABNORMAL HIGH (ref 150–400)
RBC: 5.1 MIL/uL (ref 3.87–5.11)
RDW: 17.2 % — AB (ref 11.5–15.5)
WBC: 7.1 10*3/uL (ref 4.0–10.5)

## 2014-06-02 LAB — BASIC METABOLIC PANEL
ANION GAP: 7 (ref 5–15)
BUN: 10 mg/dL (ref 6–23)
CHLORIDE: 105 meq/L (ref 96–112)
CO2: 23 mmol/L (ref 19–32)
Calcium: 8.7 mg/dL (ref 8.4–10.5)
Creatinine, Ser: 0.73 mg/dL (ref 0.50–1.10)
GFR calc Af Amer: >90 mL/min (ref >90)
GFR calc non Af Amer: >90 mL/min (ref >90)
Glucose, Bld: 130 mg/dL — ABNORMAL HIGH (ref 70–99)
POTASSIUM: 3.6 mmol/L (ref 3.5–5.1)
Sodium: 135 mmol/L (ref 135–145)

## 2014-06-02 LAB — URINALYSIS, ROUTINE W REFLEX MICROSCOPIC
BILIRUBIN URINE: NEGATIVE
Glucose, UA: NEGATIVE mg/dL
HGB URINE DIPSTICK: NEGATIVE
Ketones, ur: NEGATIVE mg/dL
Leukocytes, UA: NEGATIVE
Nitrite: NEGATIVE
PROTEIN: NEGATIVE mg/dL
Specific Gravity, Urine: 1.022 (ref 1.005–1.030)
UROBILINOGEN UA: 1 mg/dL (ref 0.0–1.0)
pH: 7.5 (ref 5.0–8.0)

## 2014-06-02 LAB — I-STAT CHEM 8, ED
BUN: 11 mg/dL (ref 6–23)
Calcium, Ion: 1.08 mmol/L — ABNORMAL LOW (ref 1.12–1.23)
Chloride: 105 mEq/L (ref 96–112)
Creatinine, Ser: 0.7 mg/dL (ref 0.50–1.10)
Glucose, Bld: 129 mg/dL — ABNORMAL HIGH (ref 70–99)
HCT: 43 % (ref 36.0–46.0)
HEMOGLOBIN: 14.6 g/dL (ref 12.0–15.0)
POTASSIUM: 4.5 mmol/L (ref 3.5–5.1)
Sodium: 136 mmol/L (ref 135–145)
TCO2: 21 mmol/L (ref 0–100)

## 2014-06-02 LAB — CBG MONITORING, ED: Glucose-Capillary: 126 mg/dL — ABNORMAL HIGH (ref 70–99)

## 2014-06-02 LAB — POC URINE PREG, ED: PREG TEST UR: NEGATIVE

## 2014-06-02 MED ORDER — OSELTAMIVIR PHOSPHATE 75 MG PO CAPS: 75.0000 mg | ORAL_CAPSULE | Freq: Two times a day (BID) | ORAL | Status: DC

## 2014-06-02 MED ORDER — PROMETHAZINE HCL 25 MG/ML IJ SOLN
25.0000 mg | Freq: Once | INTRAMUSCULAR | Status: AC
  Administered 2014-06-02: 25 mg via INTRAVENOUS
  Filled 2014-06-02: qty 1

## 2014-06-02 MED ORDER — ONDANSETRON HCL 4 MG/2ML IJ SOLN
4.0000 mg | Freq: Once | INTRAMUSCULAR | Status: AC
  Administered 2014-06-02: 4 mg via INTRAVENOUS
  Filled 2014-06-02: qty 2

## 2014-06-02 MED ORDER — SODIUM CHLORIDE 0.9 % IV BOLUS (SEPSIS)
1000.0000 mL | Freq: Once | INTRAVENOUS | Status: AC
  Administered 2014-06-02: 1000 mL via INTRAVENOUS

## 2014-06-02 MED ORDER — BENZONATATE 100 MG PO CAPS: 100.0000 mg | ORAL_CAPSULE | Freq: Three times a day (TID) | ORAL | Status: DC

## 2014-06-02 MED ORDER — KETOROLAC TROMETHAMINE 30 MG/ML IJ SOLN
30.0000 mg | Freq: Once | INTRAMUSCULAR | Status: AC
  Administered 2014-06-02: 30 mg via INTRAVENOUS
  Filled 2014-06-02: qty 1

## 2014-06-02 MED ORDER — PROMETHAZINE HCL 12.5 MG PO TABS: 12.5000 mg | ORAL_TABLET | Freq: Four times a day (QID) | ORAL | Status: DC | PRN

## 2014-06-02 MED ORDER — NAPROXEN 500 MG PO TABS: 500.0000 mg | ORAL_TABLET | Freq: Two times a day (BID) | ORAL | Status: DC

## 2014-06-02 MED ORDER — BENZONATATE 100 MG PO CAPS
200.0000 mg | ORAL_CAPSULE | Freq: Once | ORAL | Status: AC
  Administered 2014-06-02: 200 mg via ORAL
  Filled 2014-06-02: qty 2

## 2014-06-02 NOTE — ED Notes (Signed)
Pt states she has had cough and cold sxs for the past two days  Pt states today she started having chills and a fever with vomiting that started around 2pm today   Pt states she vomited again around 1730 and went to the bathroom to get a cold compress and had a syncopal episode in the restroom  Pt states when she passed out she hit her head on something  Pt is now c/o headache and neck pain  Pt states she continues to be nauseated also

## 2014-06-02 NOTE — Discharge Instructions (Signed)
Please call your doctor for a followup appointment within 24-48 hours. When you talk to your doctor please let them know that you were seen in the emergency department and have them acquire all of your records so that they can discuss the findings with you and formulate a treatment plan to fully care for your new and ongoing problems.  RESOURCE GUIDE  Chronic Pain Problems: Contact [facility]  [phone] Patients need to be referred by their primary care doctor.  Insufficient Money for Medicine: Contact [employer]:  call "211."   No Primary Care Doctor: - Call [facility] Connect  [phone] - can help you locate a primary care doctor that  accepts your insurance, provides certain services, etc. - Physician Referral Service- [phone]  Agencies that provide inexpensive medical care: - [facility]  [facility]  [phone] - [facility]  [phone] - [facility]  [phone] - [facility]  [facility]  [phone]  [place] Providers: - [facility]- [address]  [phone], Mon-Fri 9am-7pm, Sat 9am-1pm - [facility]- [place], [address] [place]  [place], [address] [place]  [place]- [address]  [place], [address], [phone]  Only accepts [place] patients after they have their name  applied to their card  Self Pay (no insurance) in [place]: - Sickle Cell Patients: Dr Kevan Ny, [facility]  [place], [facility]- [place]  [facility]- [address], [facility]- see information above (Speak to [employer] if you do not have  insurance)       -  [facility]- [place],  [place], [place]  Dr Vista Lawman-  [address], [place], [place], [place]       -  [facility] - [address] [phone]       -  [facility]- [address], [place], also [address] [phone]       -    [facility]- [place], [place], 1st & 3rd Saturday        every month, 10am-1pm  [facility] [place], [place] [address] [phone]  [place]. [place], [place] [address] [phone]  1) Find a Doctor and Pay Out of Pocket Although you won't have to find out who is covered by your insurance plan, it is a good idea to ask around and get recommendations. You will then need to call the office and see if the doctor you have chosen will accept you as a new patient and what types of options they offer for patients who are self-pay. Some doctors offer discounts or will set up payment plans for their patients who do not have insurance, but you will need to ask so you aren't surprised when you  get to your appointment.  2) Contact Your Local Health Department Not all health departments have doctors that can see patients for sick visits, but many do, so it is worth a call to see if yours does. If you don't know where your local health department is, you can check in your phone book. The CDC also has a tool to help you locate your state's health department, and many state websites also have listings of all of their local health departments.  3) Find a [place] [facility] If your illness is not likely to be very severe or complicated, you may want to try a walk in clinic. These are popping up all over the country in pharmacies, drugstores, and shopping centers. They're usually staffed by nurse practitioners or physician  assistants that have been trained to treat common illnesses and complaints. They're usually fairly quick and inexpensive. However, if you have serious medical issues or chronic medical problems, these are probably not your best option  STD Testing - [place], [facility], [address] [place], phone [phone] or [phone].  Monday - Friday, call for an appointment. - [place], [facility] Clinic, [address], [place], phone [phone] or [phone].  Monday - Friday, call for an appointment.  Abuse/Neglect: - [place] [phone] - [place] [phone] (After Hours)  Emergency Shelter:  [facility] [phone]  Maternity Homes: - [facility] [place] [phone] - [place] [phone]  MRSA Hotline #:   586-317-1446  Dental Assistance If unable to pay or uninsured, contact:  [facility]. to become qualified for the adult dental clinic.  Patients with Medicaid: [facility] [phone] [address], [place] [address] [phone]  If unable to pay, or uninsured, contact [facility] [phone] in [place], [place] in [facility]) to become qualified for the adult dental clinic  [facility] [address] [place], [place] [address] [phone] [email or web address]  Other [place]: - [facility] [place], [place], [place], [address], [place], 2nd and 4th Thursday of the month at 6:30am.  10 clients each day by appointment, can sometimes see walk-in patients if someone does not show for an appointment. [facility]- [address] Hillard Danker [place], [place], [address], [place], [place], [place],  [address], [place] Department- [place] [phone] -

## 2014-06-02 NOTE — ED Provider Notes (Signed)
CSN: [id number]     Arrival date & time 06/02/14  2054 History   First MD Initiated Contact with Patient 06/02/14 2117     Chief Complaint  Patient presents with  . Loss of Consciousness  . Emesis  . Fever     (Consider location/radiation/quality/duration/timing/severity/associated sxs/prior Treatment) HPI Comments: 49 year old female, history of cesarean section, partial hysterectomy,  the patient is presents with cold and cough for the last 2 days. She has had body aches, subjective fevers and started to have vomiting this afternoon. The symptoms are persistent, nothing makes this better or worse, there gradually worsening, she denies diarrhea, dysuria, rashes or swelling. She denies getting a flu shot, she has had a sick exposure at home with someone with a similar upper respiratory illness. She reports that while she was walking between rooms in her home she had a syncopal episode striking the floor, she has resultant neck pain and a headache though the headache did start before she fell.  Patient is a 49 y.o. female presenting with syncope, vomiting, and fever. The history is provided by the patient.  Loss of Consciousness Associated symptoms: fever and vomiting   Emesis Fever Associated symptoms: vomiting     Past Medical History  Diagnosis Date  . DUB (dysfunctional uterine bleeding)   . Anemia   . Morbid obesity     BMI 47   Past Surgical History  Procedure Laterality Date  . Cesarean section    . Endometrial ablation    . Laparoscopy  1990    with laparotomy  . Partial hysterectomy    . Dilation and curettage of uterus     Family History  Problem Relation Age of Onset  . Hyperlipidemia Father   . Hypertension Other    History  Substance Use Topics  . Smoking status: Never Smoker   . Smokeless tobacco: Never Used  . Alcohol Use: Yes     Comment: social   OB History    Gravida Para Term Preterm AB TAB SAB Ectopic Multiple Living   1 1 1             Review  of Systems  Constitutional: Positive for fever.  Cardiovascular: Positive for syncope.  Gastrointestinal: Positive for vomiting.  All other systems reviewed and are negative.     Allergies  Miconazole nitrate and Vagisil  Home Medications   Prior to Admission medications   Medication Sig Start Date End Date Taking? Authorizing Provider  ibuprofen (ADVIL,MOTRIN) 200 MG tablet Take 400 mg by mouth every 6 (six) hours as needed for moderate pain.   Yes Historical Provider, MD  benzonatate (TESSALON) 100 MG capsule Take 1 capsule (100 mg total) by mouth every 8 (eight) hours. 06/02/14   Johnna Acosta, MD  naproxen (NAPROSYN) 500 MG tablet Take 1 tablet (500 mg total) by mouth 2 (two) times daily with a meal. 06/02/14   Johnna Acosta, MD  oseltamivir (TAMIFLU) 75 MG capsule Take 1 capsule (75 mg total) by mouth every 12 (twelve) hours. 06/02/14   Johnna Acosta, MD  promethazine (PHENERGAN) 12.5 MG tablet Take 1 tablet (12.5 mg total) by mouth every 6 (six) hours as needed for nausea or vomiting. 06/02/14   Johnna Acosta, MD   BP 91/52 mmHg  Pulse 105  Temp(Src) 99.9 F (37.7 C) (Oral)  Resp 20  SpO2 97%  LMP 07/14/2013 Physical Exam  Constitutional: She appears well-developed and well-nourished. No distress.  HENT:  Head: Normocephalic and atraumatic.  Mouth/Throat: No oropharyngeal exudate.  Erythematous, no exudate, asymetry or hypertrophy  Eyes: Conjunctivae and EOM are normal. Pupils are equal, round, and reactive to light. Right eye exhibits no discharge. Left eye exhibits no discharge. No scleral icterus.  Neck: Normal range of motion. Neck supple. No JVD present. No thyromegaly present.  Very supple neck, no LAD, no posterior ttp  Cardiovascular: Regular rhythm, normal heart sounds and intact distal pulses.  Exam reveals no gallop and no friction rub.   No murmur heard. tachycardia  Pulmonary/Chest: Effort normal and breath sounds normal. No respiratory distress. She has no  wheezes. She has no rales.  Frequently coughs with deep breathing  Abdominal: Soft. Bowel sounds are normal. She exhibits no distension and no mass. There is no tenderness.  Musculoskeletal: Normal range of motion. She exhibits no edema or tenderness.  Lymphadenopathy:    She has no cervical adenopathy.  Neurological: She is alert. Coordination normal.  Skin: Skin is warm and dry. No rash noted. No erythema.  Psychiatric: She has a normal mood and affect. Her behavior is normal.  Nursing note and vitals reviewed.   ED Course  Procedures (including critical care time) Labs Review Labs Reviewed  CBC - Abnormal; Notable for the following:    MCV 77.1 (*)    MCH 23.5 (*)    RDW 17.2 (*)    Platelets 495 (*)    All other components within normal limits  URINALYSIS, ROUTINE W REFLEX MICROSCOPIC - Abnormal; Notable for the following:    APPearance CLOUDY (*)    All other components within normal limits  CBG MONITORING, ED - Abnormal; Notable for the following:    Glucose-Capillary 126 (*)    All other components within normal limits  I-STAT CHEM 8, ED - Abnormal; Notable for the following:    Glucose, Bld 129 (*)    Calcium, Ion 1.08 (*)    All other components within normal limits  BASIC METABOLIC PANEL  INFLUENZA PANEL BY PCR (TYPE A & B, H1N1)  CBG MONITORING, ED  POC URINE PREG, ED    Imaging Review Dg Chest 2 View  06/02/2014   CLINICAL DATA:  Cough, chest pain.  Shortness of breath.  EXAM: CHEST  2 VIEW  COMPARISON:  None.  FINDINGS: Minimal atelectasis right lung base. The cardiomediastinal contours are normal. Pulmonary vasculature is normal. No consolidation, pleural effusion, or pneumothorax. No acute osseous abnormalities are seen.  IMPRESSION: Minimal right basilar atelectasis, otherwise no acute pulmonary process.   Electronically Signed   By: Jeb Levering M.D.   On: 06/02/2014 23:01   Ct Head Wo Contrast  06/02/2014   CLINICAL DATA:  Trauma. Syncope with fall,  striking head on unknown object. Now with neck pain and headache.  EXAM: CT HEAD WITHOUT CONTRAST  CT CERVICAL SPINE WITHOUT CONTRAST  TECHNIQUE: Multidetector CT imaging of the head and cervical spine was performed following the standard protocol without intravenous contrast. Multiplanar CT image reconstructions of the cervical spine were also generated.  COMPARISON:  None.  FINDINGS: CT HEAD FINDINGS  Ventricles and sulci appear symmetrical. No mass effect or midline shift. No abnormal extra-axial fluid collections. Gray-white matter junctions are distinct. Basal cisterns are not effaced. No evidence of acute intracranial hemorrhage. No depressed skull fractures. Visualized paranasal sinuses and mastoid air cells are not opacified.  CT CERVICAL SPINE FINDINGS  Straightening of the usual cervical lordosis. This is nonspecific and may be due to patient positioning although ligamentous injury or muscle spasm  could also have this appearance and are not excluded. Degenerative changes in the cervical spine with narrowed cervical interspaces and anterior and posterior endplate osteophytes at C4-5, C5-6, and C6-7 levels. Degenerative changes throughout the cervical facet joints normal alignment of the facet joints. C1-2 articulation appears intact. No vertebral compression deformities. No prevertebral soft tissue swelling. No focal bone lesion or bone destruction. Bone cortex and trabecular architecture appear intact.  IMPRESSION: No acute intracranial abnormalities.  Nonspecific straightening of the usual cervical lordosis. Degenerative changes in the cervical spine. No displaced fractures identified.   Electronically Signed   By: Lucienne Capers M.D.   On: 06/02/2014 23:20   Ct Cervical Spine Wo Contrast  06/02/2014   CLINICAL DATA:  Trauma. Syncope with fall, striking head on unknown object. Now with neck pain and headache.  EXAM: CT HEAD WITHOUT CONTRAST  CT CERVICAL SPINE WITHOUT CONTRAST  TECHNIQUE: Multidetector  CT imaging of the head and cervical spine was performed following the standard protocol without intravenous contrast. Multiplanar CT image reconstructions of the cervical spine were also generated.  COMPARISON:  None.  FINDINGS: CT HEAD FINDINGS  Ventricles and sulci appear symmetrical. No mass effect or midline shift. No abnormal extra-axial fluid collections. Gray-white matter junctions are distinct. Basal cisterns are not effaced. No evidence of acute intracranial hemorrhage. No depressed skull fractures. Visualized paranasal sinuses and mastoid air cells are not opacified.  CT CERVICAL SPINE FINDINGS  Straightening of the usual cervical lordosis. This is nonspecific and may be due to patient positioning although ligamentous injury or muscle spasm could also have this appearance and are not excluded. Degenerative changes in the cervical spine with narrowed cervical interspaces and anterior and posterior endplate osteophytes at C4-5, C5-6, and C6-7 levels. Degenerative changes throughout the cervical facet joints normal alignment of the facet joints. C1-2 articulation appears intact. No vertebral compression deformities. No prevertebral soft tissue swelling. No focal bone lesion or bone destruction. Bone cortex and trabecular architecture appear intact.  IMPRESSION: No acute intracranial abnormalities.  Nonspecific straightening of the usual cervical lordosis. Degenerative changes in the cervical spine. No displaced fractures identified.   Electronically Signed   By: Lucienne Capers M.D.   On: 06/02/2014 23:20     EKG Interpretation   Date/Time:  Tuesday June 02 2014 21:01:18 EST Ventricular Rate:  118 PR Interval:  153 QRS Duration: 84 QT Interval:  311 QTC Calculation: 436 R Axis:   8 Text Interpretation:  Sinus tachycardia Borderline T abnormalities,  diffuse leads Abnormal ekg No old tracing to compare Confirmed by Japji Kok   MD, [place] ([id number]) on 06/02/2014 9:47:44 PM      MDM   Final  diagnoses:  Trauma  Cough  Flu-like symptoms    Pt has flu like illness - r/o PNA, very dehydrdated by history.  We'll check chest x-ray, labs, orthostatics. She does have a low-grade fever, tachycardia, will give fluid hydration, EKG shows no significant abnormalities, unlikely to have cardiac problem  121/62 on repeat exam, ha improved with toradol  PO challengs, labs and imaging negative  Meds given in ED:  Medications  promethazine (PHENERGAN) injection 25 mg (not administered)  ondansetron (ZOFRAN) injection 4 mg (4 mg Intravenous Given 06/02/14 2134)  ketorolac (TORADOL) 30 MG/ML injection 30 mg (30 mg Intravenous Given 06/02/14 2303)  sodium chloride 0.9 % bolus 1,000 mL (1,000 mLs Intravenous New Bag/Given 06/02/14 2306)  benzonatate (TESSALON) capsule 200 mg (200 mg Oral Given 06/02/14 2328)    New Prescriptions  BENZONATATE (TESSALON) 100 MG CAPSULE    Take 1 capsule (100 mg total) by mouth every 8 (eight) hours.   NAPROXEN (NAPROSYN) 500 MG TABLET    Take 1 tablet (500 mg total) by mouth 2 (two) times daily with a meal.   OSELTAMIVIR (TAMIFLU) 75 MG CAPSULE    Take 1 capsule (75 mg total) by mouth every 12 (twelve) hours.   PROMETHAZINE (PHENERGAN) 12.5 MG TABLET    Take 1 tablet (12.5 mg total) by mouth every 6 (six) hours as needed for nausea or vomiting.        Johnna Acosta, MD 06/02/14 [phone]

## 2014-06-03 ENCOUNTER — Encounter: Payer: Self-pay | Admitting: Obstetrics & Gynecology

## 2014-06-03 LAB — INFLUENZA PANEL BY PCR (TYPE A & B)
H1N1 flu by pcr: NOT DETECTED
Influenza A By PCR: NEGATIVE
Influenza B By PCR: NEGATIVE

## 2014-06-07 ENCOUNTER — Emergency Department (HOSPITAL_COMMUNITY): Payer: Self-pay

## 2014-06-07 ENCOUNTER — Encounter (HOSPITAL_COMMUNITY): Payer: Self-pay | Admitting: Emergency Medicine

## 2014-06-07 ENCOUNTER — Emergency Department (HOSPITAL_COMMUNITY)
Admission: EM | Admit: 2014-06-07 | Discharge: 2014-06-07 | Disposition: A | Discharge status: Home / Self Care | Payer: Self-pay | Attending: Emergency Medicine | Admitting: Emergency Medicine

## 2014-06-07 DIAGNOSIS — J159 Unspecified bacterial pneumonia: Secondary | ICD-10-CM | POA: Insufficient documentation

## 2014-06-07 DIAGNOSIS — Z8742 Personal history of other diseases of the female genital tract: Secondary | ICD-10-CM | POA: Insufficient documentation

## 2014-06-07 DIAGNOSIS — Z862 Personal history of diseases of the blood and blood-forming organs and certain disorders involving the immune mechanism: Secondary | ICD-10-CM | POA: Insufficient documentation

## 2014-06-07 DIAGNOSIS — J189 Pneumonia, unspecified organism: Secondary | ICD-10-CM

## 2014-06-07 DIAGNOSIS — R079 Chest pain, unspecified: Secondary | ICD-10-CM

## 2014-06-07 LAB — BASIC METABOLIC PANEL
ANION GAP: 9 (ref 5–15)
BUN: 12 mg/dL (ref 6–23)
CHLORIDE: 99 meq/L (ref 96–112)
CO2: 26 mmol/L (ref 19–32)
Calcium: 8.3 mg/dL — ABNORMAL LOW (ref 8.4–10.5)
Creatinine, Ser: 0.7 mg/dL (ref 0.50–1.10)
GFR calc Af Amer: >90 mL/min (ref >90)
GFR calc non Af Amer: >90 mL/min (ref >90)
Glucose, Bld: 154 mg/dL — ABNORMAL HIGH (ref 70–99)
POTASSIUM: 3.2 mmol/L — AB (ref 3.5–5.1)
Sodium: 134 mmol/L — ABNORMAL LOW (ref 135–145)

## 2014-06-07 LAB — I-STAT TROPONIN, ED: Troponin i, poc: 0 ng/mL (ref 0.00–0.08)

## 2014-06-07 LAB — BRAIN NATRIURETIC PEPTIDE: B Natriuretic Peptide: 11.6 pg/mL (ref 0.0–100.0)

## 2014-06-07 LAB — CBC
HCT: 40.6 % (ref 36.0–46.0)
HEMOGLOBIN: 12.6 g/dL (ref 12.0–15.0)
MCH: 23.7 pg — ABNORMAL LOW (ref 26.0–34.0)
MCHC: 31 g/dL (ref 30.0–36.0)
MCV: 76.5 fL — ABNORMAL LOW (ref 78.0–100.0)
Platelets: 295 10*3/uL (ref 150–400)
RBC: 5.31 MIL/uL — ABNORMAL HIGH (ref 3.87–5.11)
RDW: 17 % — ABNORMAL HIGH (ref 11.5–15.5)
WBC: 2.8 10*3/uL — ABNORMAL LOW (ref 4.0–10.5)

## 2014-06-07 MED ORDER — GUAIFENESIN-CODEINE 100-10 MG/5ML PO SOLN: 5.0000 mL | Freq: Three times a day (TID) | ORAL | Status: DC | PRN

## 2014-06-07 MED ORDER — FLUCONAZOLE 150 MG PO TABS: 150.0000 mg | ORAL_TABLET | Freq: Every day | ORAL | Status: DC

## 2014-06-07 MED ORDER — LEVOFLOXACIN 750 MG PO TABS: 750.0000 mg | ORAL_TABLET | Freq: Every day | ORAL | Status: DC

## 2014-06-07 NOTE — ED Notes (Signed)
Pt transported radiology  

## 2014-06-07 NOTE — ED Provider Notes (Signed)
CSN: [id number]     Arrival date & time 06/07/14  1916 History   First MD Initiated Contact with Patient 06/07/14 1942     Chief Complaint  Patient presents with  . Shortness of Breath  . Chest Pain     (Consider location/radiation/quality/duration/timing/severity/associated sxs/prior Treatment) HPI Comments: Patient presents today with a chief complaint of chest pain, SOB, and cough.  She reports that she has had a cough for over a week.  Cough gradually worsening.  She states that two days ago she began having chest pain, which she thinks was from the coughing.  Pain has been constant since then and is worse with coughing.  Pain located across her chest and does not radiate.  She was seen in the ED on 06/02/13 for similar symptoms.  At that time she was diagnosed with possible Influenza and given Rx for Phenergan, Tessalon, and Tamiflu.  Influenza panel done on 06/02/13 reviewed today and was negative for Influenza.  She reports that she has been taking medications as prescribed, but does not feel that it is helping.  She reports associated SOB.  She is unsure if she has had a fever.  Temp is 98.6 F orally upon arrival in the ED.  She denies history of DVT or PE.  Denies prolonged travel or surgery in the past 4 weeks.  Denies use of Estrogen containing medications.  She does not smoke. Denies LE edema, nausea, vomiting, numbness, or tingling.   Patient is a 49 y.o. female presenting with shortness of breath and chest pain. The history is provided by the patient.  Shortness of Breath Associated symptoms: chest pain   Chest Pain Associated symptoms: shortness of breath     Past Medical History  Diagnosis Date  . DUB (dysfunctional uterine bleeding)   . Anemia   . Morbid obesity     BMI 47   Past Surgical History  Procedure Laterality Date  . Cesarean section    . Endometrial ablation    . Laparoscopy  1990    with laparotomy  . Partial hysterectomy    . Dilation and curettage of uterus      Family History  Problem Relation Age of Onset  . Hyperlipidemia Father   . Hypertension Other    History  Substance Use Topics  . Smoking status: Never Smoker   . Smokeless tobacco: Never Used  . Alcohol Use: Yes     Comment: social   OB History    Gravida Para Term Preterm AB TAB SAB Ectopic Multiple Living   1 1 1             Review of Systems  Respiratory: Positive for shortness of breath.   Cardiovascular: Positive for chest pain.  All other systems reviewed and are negative.     Allergies  Miconazole nitrate and Vagisil  Home Medications   Prior to Admission medications   Medication Sig Start Date End Date Taking? Authorizing Provider  benzonatate (TESSALON) 100 MG capsule Take 1 capsule (100 mg total) by mouth every 8 (eight) hours. 06/02/14  Yes Johnna Acosta, MD  naproxen (NAPROSYN) 500 MG tablet Take 1 tablet (500 mg total) by mouth 2 (two) times daily with a meal. 06/02/14  Yes Johnna Acosta, MD  oseltamivir (TAMIFLU) 75 MG capsule Take 1 capsule (75 mg total) by mouth every 12 (twelve) hours. 06/02/14  Yes Johnna Acosta, MD  promethazine (PHENERGAN) 12.5 MG tablet Take 1 tablet (12.5 mg total) by mouth  every 6 (six) hours as needed for nausea or vomiting. 06/02/14  Yes Johnna Acosta, MD  ibuprofen (ADVIL,MOTRIN) 200 MG tablet Take 200 mg by mouth every 6 (six) hours as needed for moderate pain.     Historical Provider, MD   BP 117/63 mmHg  Pulse 96  Temp(Src) 98.6 F (37 C) (Oral)  Resp 20  SpO2 96%  LMP 07/14/2013 Physical Exam  Constitutional: She appears well-developed and well-nourished.  HENT:  Head: Normocephalic and atraumatic.  Mouth/Throat: Oropharynx is clear and moist.  Neck: Normal range of motion. Neck supple.  Cardiovascular: Normal rate, regular rhythm and normal heart sounds.   Pulmonary/Chest: Effort normal and breath sounds normal. No respiratory distress. She has no wheezes. She has no rales. She exhibits tenderness.  Abdominal:  Soft. Bowel sounds are normal. There is no tenderness.  Musculoskeletal: Normal range of motion.  No LE edema, erythema, or tenderness bilaterally  Neurological: She is alert.  Skin: Skin is warm and dry.  Psychiatric: She has a normal mood and affect.  Nursing note and vitals reviewed.   ED Course  Procedures (including critical care time) Labs Review Labs Reviewed  CBC - Abnormal; Notable for the following:    WBC 2.8 (*)    RBC 5.31 (*)    MCV 76.5 (*)    MCH 23.7 (*)    RDW 17.0 (*)    All other components within normal limits  BASIC METABOLIC PANEL  BRAIN NATRIURETIC PEPTIDE  I-STAT TROPOININ, ED    Imaging Review No results found.   EKG Interpretation   Date/Time:  Sunday June 07 2014 19:28:48 EST Ventricular Rate:  95 PR Interval:  138 QRS Duration: 84 QT Interval:  363 QTC Calculation: 456 R Axis:   -5 Text Interpretation:  Sinus rhythm Borderline T abnormalities, inferior  leads Baseline wander in lead(s) II aVF V1 Confirmed by Jeneen Rinks  MD, [place]  [phone]) on 06/07/2014 7:35:29 PM      MDM   Final diagnoses:  Chest pain   Patient presents with cough x 1 week and constant non radiating chest pain for the past 2 days.  Labs unremarkable.  Troponin negative.  Doubt ACS due to the fact that the pain has been constant for 2 days and Troponin is negative.  She is PERC negative.  Chest wall tender to palpation.  CXR showing possible multilobular Pneumonia.  No hospitalizations in the past 90 days.  Therefore, feel that she can be treated for CAP.  Due to the fact that the Pneumonia is multilobular she was started on Levaquin.  No signs of respiratory distress.  She is not hypoxic.  No hypoxia with ambulating.  Therefore, feel that she is stable for discharge.  Return precautions given.  Patient also discussed with Dr. Regenia Skeeter who is in agreement with the plan.    Hyman Bible, PA-C 06/10/14 [place], MD 06/12/14 2105

## 2014-06-07 NOTE — ED Notes (Signed)
Pt arrived to the ED with a complaint of chest pain with shortness of breath.  Pt states she was seen last week given tamiflu and cough suppressant but symptoms did not alleviate.  Pt states Friday her chest began to hurt and an increased difficulty breathing ensued.  Pt states chest pain is centrally located.  Is sore and hurts more when pt coughs.

## 2014-09-27 LAB — HM MAMMOGRAPHY: HM MAMMO: NORMAL

## 2014-12-21 ENCOUNTER — Encounter: Payer: Self-pay | Admitting: Behavioral Health

## 2014-12-21 ENCOUNTER — Telehealth: Payer: Self-pay | Admitting: Behavioral Health

## 2014-12-21 NOTE — Telephone Encounter (Signed)
Pre-Visit Call completed with patient and chart updated.   Pre-Visit Info documented in Specialty Comments under [employer].    

## 2014-12-22 ENCOUNTER — Other Ambulatory Visit (HOSPITAL_COMMUNITY)
Admission: RE | Admit: 2014-12-22 | Discharge: 2014-12-22 | Disposition: A | Discharge status: Home / Self Care | Payer: Self-pay | Source: Ambulatory Visit | Attending: Family Medicine | Admitting: Family Medicine

## 2014-12-22 ENCOUNTER — Encounter: Payer: Self-pay | Admitting: Family Medicine

## 2014-12-22 ENCOUNTER — Ambulatory Visit (INDEPENDENT_AMBULATORY_CARE_PROVIDER_SITE_OTHER): Payer: Self-pay | Admitting: Family Medicine

## 2014-12-22 VITALS — BP 114/72 | HR 78 | Temp 99.5°F | Ht 65.75 in | Wt 282.0 lb

## 2014-12-22 DIAGNOSIS — Z23 Encounter for immunization: Secondary | ICD-10-CM

## 2014-12-22 DIAGNOSIS — E7800 Pure hypercholesterolemia, unspecified: Secondary | ICD-10-CM

## 2014-12-22 DIAGNOSIS — Z113 Encounter for screening for infections with a predominantly sexual mode of transmission: Secondary | ICD-10-CM | POA: Insufficient documentation

## 2014-12-22 DIAGNOSIS — Z124 Encounter for screening for malignant neoplasm of cervix: Secondary | ICD-10-CM

## 2014-12-22 DIAGNOSIS — N760 Acute vaginitis: Secondary | ICD-10-CM | POA: Insufficient documentation

## 2014-12-22 DIAGNOSIS — Z01419 Encounter for gynecological examination (general) (routine) without abnormal findings: Secondary | ICD-10-CM | POA: Insufficient documentation

## 2014-12-22 DIAGNOSIS — Z0000 Encounter for general adult medical examination without abnormal findings: Secondary | ICD-10-CM

## 2014-12-22 LAB — POCT URINALYSIS DIPSTICK
Bilirubin, UA: NEGATIVE
Blood, UA: NEGATIVE
GLUCOSE UA: NEGATIVE
KETONES UA: NEGATIVE
Leukocytes, UA: NEGATIVE
Nitrite, UA: NEGATIVE
PH UA: 7
SPEC GRAV UA: 1.02
Urobilinogen, UA: 0.2

## 2014-12-22 NOTE — Patient Instructions (Signed)
Preventive Care for Adults A healthy lifestyle and preventive care can promote health and wellness. Preventive health guidelines for women include the following key practices.  A routine yearly physical is a good way to check with your health care provider about your health and preventive screening. It is a chance to share any concerns and updates on your health and to receive a thorough exam.  Visit your dentist for a routine exam and preventive care every 6 months. Brush your teeth twice a day and floss once a day. Good oral hygiene prevents tooth decay and gum disease.  The frequency of eye exams is based on your age, health, family medical history, use of contact lenses, and other factors. Follow your health care provider's recommendations for frequency of eye exams.  Eat a healthy diet. Foods like vegetables, fruits, whole grains, low-fat dairy products, and lean protein foods contain the nutrients you need without too many calories. Decrease your intake of foods high in solid fats, added sugars, and salt. Eat the right amount of calories for you.Get information about a proper diet from your health care provider, if necessary.  Regular physical exercise is one of the most important things you can do for your health. Most adults should get at least 150 minutes of moderate-intensity exercise (any activity that increases your heart rate and causes you to sweat) each week. In addition, most adults need muscle-strengthening exercises on 2 or more days a week.  Maintain a healthy weight. The body mass index (BMI) is a screening tool to identify possible weight problems. It provides an estimate of body fat based on height and weight. Your health care provider can find your BMI and can help you achieve or maintain a healthy weight.For adults 20 years and older:  A BMI below 18.5 is considered underweight.  A BMI of 18.5 to 24.9 is normal.  A BMI of 25 to 29.9 is considered overweight.  A BMI of  30 and above is considered obese.  Maintain normal blood lipids and cholesterol levels by exercising and minimizing your intake of saturated fat. Eat a balanced diet with plenty of fruit and vegetables. Blood tests for lipids and cholesterol should begin at age 76 and be repeated every 5 years. If your lipid or cholesterol levels are high, you are over 50, or you are at high risk for heart disease, you may need your cholesterol levels checked more frequently.Ongoing high lipid and cholesterol levels should be treated with medicines if diet and exercise are not working.  If you smoke, find out from your health care provider how to quit. If you do not use tobacco, do not start.  Lung cancer screening is recommended for adults aged 22-80 years who are at high risk for developing lung cancer because of a history of smoking. A yearly low-dose CT scan of the lungs is recommended for people who have at least a 30-pack-year history of smoking and are a current smoker or have quit within the past 15 years. A pack year of smoking is smoking an average of 1 pack of cigarettes a day for 1 year (for example: 1 pack a day for 30 years or 2 packs a day for 15 years). Yearly screening should continue until the smoker has stopped smoking for at least 15 years. Yearly screening should be stopped for people who develop a health problem that would prevent them from having lung cancer treatment.  If you are pregnant, do not drink alcohol. If you are breastfeeding,  be very cautious about drinking alcohol. If you are not pregnant and choose to drink alcohol, do not have more than 1 drink per day. One drink is considered to be 12 ounces (355 mL) of beer, 5 ounces (148 mL) of wine, or 1.5 ounces (44 mL) of liquor.  Avoid use of street drugs. Do not share needles with anyone. Ask for help if you need support or instructions about stopping the use of drugs.  High blood pressure causes heart disease and increases the risk of  stroke. Your blood pressure should be checked at least every 1 to 2 years. Ongoing high blood pressure should be treated with medicines if weight loss and exercise do not work.  If you are 3-86 years old, ask your health care provider if you should take aspirin to prevent strokes.  Diabetes screening involves taking a blood sample to check your fasting blood sugar level. This should be done once every 3 years, after age 67, if you are within normal weight and without risk factors for diabetes. Testing should be considered at a younger age or be carried out more frequently if you are overweight and have at least 1 risk factor for diabetes.  Breast cancer screening is essential preventive care for women. You should practice "breast self-awareness." This means understanding the normal appearance and feel of your breasts and may include breast self-examination. Any changes detected, no matter how small, should be reported to a health care provider. Women in their 8s and 30s should have a clinical breast exam (CBE) by a health care provider as part of a regular health exam every 1 to 3 years. After age 70, women should have a CBE every year. Starting at age 25, women should consider having a mammogram (breast X-ray test) every year. Women who have a family history of breast cancer should talk to their health care provider about genetic screening. Women at a high risk of breast cancer should talk to their health care providers about having an MRI and a mammogram every year.  Breast cancer gene (BRCA)-related cancer risk assessment is recommended for women who have family members with BRCA-related cancers. BRCA-related cancers include breast, ovarian, tubal, and peritoneal cancers. Having family members with these cancers may be associated with an increased risk for harmful changes (mutations) in the breast cancer genes BRCA1 and BRCA2. Results of the assessment will determine the need for genetic counseling and  BRCA1 and BRCA2 testing.  Routine pelvic exams to screen for cancer are no longer recommended for nonpregnant women who are considered low risk for cancer of the pelvic organs (ovaries, uterus, and vagina) and who do not have symptoms. Ask your health care provider if a screening pelvic exam is right for you.  If you have had past treatment for cervical cancer or a condition that could lead to cancer, you need Pap tests and screening for cancer for at least 20 years after your treatment. If Pap tests have been discontinued, your risk factors (such as having a new sexual partner) need to be reassessed to determine if screening should be resumed. Some women have medical problems that increase the chance of getting cervical cancer. In these cases, your health care provider may recommend more frequent screening and Pap tests.  The HPV test is an additional test that may be used for cervical cancer screening. The HPV test looks for the virus that can cause the cell changes on the cervix. The cells collected during the Pap test can be  tested for HPV. The HPV test could be used to screen women aged 30 years and older, and should be used in women of any age who have unclear Pap test results. After the age of 30, women should have HPV testing at the same frequency as a Pap test.  Colorectal cancer can be detected and often prevented. Most routine colorectal cancer screening begins at the age of 50 years and continues through age 75 years. However, your health care provider may recommend screening at an earlier age if you have risk factors for colon cancer. On a yearly basis, your health care provider may provide home test kits to check for hidden blood in the stool. Use of a small camera at the end of a tube, to directly examine the colon (sigmoidoscopy or colonoscopy), can detect the earliest forms of colorectal cancer. Talk to your health care provider about this at age 50, when routine screening begins. Direct  exam of the colon should be repeated every 5-10 years through age 75 years, unless early forms of pre-cancerous polyps or small growths are found.  People who are at an increased risk for hepatitis B should be screened for this virus. You are considered at high risk for hepatitis B if:  You were born in a country where hepatitis B occurs often. Talk with your health care provider about which countries are considered high risk.  Your parents were born in a high-risk country and you have not received a shot to protect against hepatitis B (hepatitis B vaccine).  You have HIV or AIDS.  You use needles to inject street drugs.  You live with, or have sex with, someone who has hepatitis B.  You get hemodialysis treatment.  You take certain medicines for conditions like cancer, organ transplantation, and autoimmune conditions.  Hepatitis C blood testing is recommended for all people born from 1945 through 1965 and any individual with known risks for hepatitis C.  Practice safe sex. Use condoms and avoid high-risk sexual practices to reduce the spread of sexually transmitted infections (STIs). STIs include gonorrhea, chlamydia, syphilis, trichomonas, herpes, HPV, and human immunodeficiency virus (HIV). Herpes, HIV, and HPV are viral illnesses that have no cure. They can result in disability, cancer, and death.  You should be screened for sexually transmitted illnesses (STIs) including gonorrhea and chlamydia if:  You are sexually active and are younger than 24 years.  You are older than 24 years and your health care provider tells you that you are at risk for this type of infection.  Your sexual activity has changed since you were last screened and you are at an increased risk for chlamydia or gonorrhea. Ask your health care provider if you are at risk.  If you are at risk of being infected with HIV, it is recommended that you take a prescription medicine daily to prevent HIV infection. This is  called preexposure prophylaxis (PrEP). You are considered at risk if:  You are a heterosexual woman, are sexually active, and are at increased risk for HIV infection.  You take drugs by injection.  You are sexually active with a partner who has HIV.  Talk with your health care provider about whether you are at high risk of being infected with HIV. If you choose to begin PrEP, you should first be tested for HIV. You should then be tested every 3 months for as long as you are taking PrEP.  Osteoporosis is a disease in which the bones lose minerals and strength   with aging. This can result in serious bone fractures or breaks. The risk of osteoporosis can be identified using a bone density scan. Women ages 65 years and over and women at risk for fractures or osteoporosis should discuss screening with their health care providers. Ask your health care provider whether you should take a calcium supplement or vitamin D to reduce the rate of osteoporosis.  Menopause can be associated with physical symptoms and risks. Hormone replacement therapy is available to decrease symptoms and risks. You should talk to your health care provider about whether hormone replacement therapy is right for you.  Use sunscreen. Apply sunscreen liberally and repeatedly throughout the day. You should seek shade when your shadow is shorter than you. Protect yourself by wearing long sleeves, pants, a wide-brimmed hat, and sunglasses year round, whenever you are outdoors.  Once a month, do a whole body skin exam, using a mirror to look at the skin on your back. Tell your health care provider of new moles, moles that have irregular borders, moles that are larger than a pencil eraser, or moles that have changed in shape or color.  Stay current with required vaccines (immunizations).  Influenza vaccine. All adults should be immunized every year.  Tetanus, diphtheria, and acellular pertussis (Td, Tdap) vaccine. Pregnant women should  receive 1 dose of Tdap vaccine during each pregnancy. The dose should be obtained regardless of the length of time since the last dose. Immunization is preferred during the 27th-36th week of gestation. An adult who has not previously received Tdap or who does not know her vaccine status should receive 1 dose of Tdap. This initial dose should be followed by tetanus and diphtheria toxoids (Td) booster doses every 10 years. Adults with an unknown or incomplete history of completing a 3-dose immunization series with Td-containing vaccines should begin or complete a primary immunization series including a Tdap dose. Adults should receive a Td booster every 10 years.  Varicella vaccine. An adult without evidence of immunity to varicella should receive 2 doses or a second dose if she has previously received 1 dose. Pregnant females who do not have evidence of immunity should receive the first dose after pregnancy. This first dose should be obtained before leaving the health care facility. The second dose should be obtained 4-8 weeks after the first dose.  Human papillomavirus (HPV) vaccine. Females aged 13-26 years who have not received the vaccine previously should obtain the 3-dose series. The vaccine is not recommended for use in pregnant females. However, pregnancy testing is not needed before receiving a dose. If a female is found to be pregnant after receiving a dose, no treatment is needed. In that case, the remaining doses should be delayed until after the pregnancy. Immunization is recommended for any person with an immunocompromised condition through the age of 26 years if she did not get any or all doses earlier. During the 3-dose series, the second dose should be obtained 4-8 weeks after the first dose. The third dose should be obtained 24 weeks after the first dose and 16 weeks after the second dose.  Zoster vaccine. One dose is recommended for adults aged 60 years or older unless certain conditions are  present.  Measles, mumps, and rubella (MMR) vaccine. Adults born before 1957 generally are considered immune to measles and mumps. Adults born in 1957 or later should have 1 or more doses of MMR vaccine unless there is a contraindication to the vaccine or there is laboratory evidence of immunity to   each of the three diseases. A routine second dose of MMR vaccine should be obtained at least 28 days after the first dose for students attending postsecondary schools, health care workers, or international travelers. People who received inactivated measles vaccine or an unknown type of measles vaccine during 1963-1967 should receive 2 doses of MMR vaccine. People who received inactivated mumps vaccine or an unknown type of mumps vaccine before 1979 and are at high risk for mumps infection should consider immunization with 2 doses of MMR vaccine. For females of childbearing age, rubella immunity should be determined. If there is no evidence of immunity, females who are not pregnant should be vaccinated. If there is no evidence of immunity, females who are pregnant should delay immunization until after pregnancy. Unvaccinated health care workers born before 1957 who lack laboratory evidence of measles, mumps, or rubella immunity or laboratory confirmation of disease should consider measles and mumps immunization with 2 doses of MMR vaccine or rubella immunization with 1 dose of MMR vaccine.  Pneumococcal 13-valent conjugate (PCV13) vaccine. When indicated, a person who is uncertain of her immunization history and has no record of immunization should receive the PCV13 vaccine. An adult aged 19 years or older who has certain medical conditions and has not been previously immunized should receive 1 dose of PCV13 vaccine. This PCV13 should be followed with a dose of pneumococcal polysaccharide (PPSV23) vaccine. The PPSV23 vaccine dose should be obtained at least 8 weeks after the dose of PCV13 vaccine. An adult aged 19  years or older who has certain medical conditions and previously received 1 or more doses of PPSV23 vaccine should receive 1 dose of PCV13. The PCV13 vaccine dose should be obtained 1 or more years after the last PPSV23 vaccine dose.  Pneumococcal polysaccharide (PPSV23) vaccine. When PCV13 is also indicated, PCV13 should be obtained first. All adults aged 65 years and older should be immunized. An adult younger than age 65 years who has certain medical conditions should be immunized. Any person who resides in a nursing home or long-term care facility should be immunized. An adult smoker should be immunized. People with an immunocompromised condition and certain other conditions should receive both PCV13 and PPSV23 vaccines. People with human immunodeficiency virus (HIV) infection should be immunized as soon as possible after diagnosis. Immunization during chemotherapy or radiation therapy should be avoided. Routine use of PPSV23 vaccine is not recommended for American Indians, [place] Natives, or people younger than 65 years unless there are medical conditions that require PPSV23 vaccine. When indicated, people who have unknown immunization and have no record of immunization should receive PPSV23 vaccine. One-time revaccination 5 years after the first dose of PPSV23 is recommended for people aged 19-64 years who have chronic kidney failure, nephrotic syndrome, asplenia, or immunocompromised conditions. People who received 1-2 doses of PPSV23 before age 65 years should receive another dose of PPSV23 vaccine at age 65 years or later if at least 5 years have passed since the previous dose. Doses of PPSV23 are not needed for people immunized with PPSV23 at or after age 65 years.  Meningococcal vaccine. Adults with asplenia or persistent complement component deficiencies should receive 2 doses of quadrivalent meningococcal conjugate (MenACWY-D) vaccine. The doses should be obtained at least 2 months apart.  Microbiologists working with certain meningococcal bacteria, military recruits, people at risk during an outbreak, and people who travel to or live in countries with a high rate of meningitis should be immunized. A first-year college student up through age   21 years who is living in a residence hall should receive a dose if she did not receive a dose on or after her 16th birthday. Adults who have certain high-risk conditions should receive one or more doses of vaccine.  Hepatitis A vaccine. Adults who wish to be protected from this disease, have certain high-risk conditions, work with hepatitis A-infected animals, work in hepatitis A research labs, or travel to or work in countries with a high rate of hepatitis A should be immunized. Adults who were previously unvaccinated and who anticipate close contact with an international adoptee during the first 60 days after arrival in the [place] from a country with a high rate of hepatitis A should be immunized.  Hepatitis B vaccine. Adults who wish to be protected from this disease, have certain high-risk conditions, may be exposed to blood or other infectious body fluids, are household contacts or sex partners of hepatitis B positive people, are clients or workers in certain care facilities, or travel to or work in countries with a high rate of hepatitis B should be immunized.  Haemophilus influenzae type b (Hib) vaccine. A previously unvaccinated person with asplenia or sickle cell disease or having a scheduled splenectomy should receive 1 dose of Hib vaccine. Regardless of previous immunization, a recipient of a hematopoietic stem cell transplant should receive a 3-dose series 6-12 months after her successful transplant. Hib vaccine is not recommended for adults with HIV infection. Preventive Services / Frequency Ages 64 to 68 years  Blood pressure check.** / Every 1 to 2 years.  Lipid and cholesterol check.** / Every 5 years beginning at age  22.  Clinical breast exam.** / Every 3 years for women in their 88s and 53s.  BRCA-related cancer risk assessment.** / For women who have family members with a BRCA-related cancer (breast, ovarian, tubal, or peritoneal cancers).  Pap test.** / Every 2 years from ages 90 through 51. Every 3 years starting at age 21 through age 56 or 3 with a history of 3 consecutive normal Pap tests.  HPV screening.** / Every 3 years from ages 24 through ages 1 to 46 with a history of 3 consecutive normal Pap tests.  Hepatitis C blood test.** / For any individual with known risks for hepatitis C.  Skin self-exam. / Monthly.  Influenza vaccine. / Every year.  Tetanus, diphtheria, and acellular pertussis (Tdap, Td) vaccine.** / Consult your health care provider. Pregnant women should receive 1 dose of Tdap vaccine during each pregnancy. 1 dose of Td every 10 years.  Varicella vaccine.** / Consult your health care provider. Pregnant females who do not have evidence of immunity should receive the first dose after pregnancy.  HPV vaccine. / 3 doses over 6 months, if 72 and younger. The vaccine is not recommended for use in pregnant females. However, pregnancy testing is not needed before receiving a dose.  Measles, mumps, rubella (MMR) vaccine.** / You need at least 1 dose of MMR if you were born in 1957 or later. You may also need a 2nd dose. For females of childbearing age, rubella immunity should be determined. If there is no evidence of immunity, females who are not pregnant should be vaccinated. If there is no evidence of immunity, females who are pregnant should delay immunization until after pregnancy.  Pneumococcal 13-valent conjugate (PCV13) vaccine.** / Consult your health care provider.  Pneumococcal polysaccharide (PPSV23) vaccine.** / 1 to 2 doses if you smoke cigarettes or if you have certain conditions.  Meningococcal vaccine.** /  1 dose if you are age 19 to 21 years and a first-year college  student living in a residence hall, or have one of several medical conditions, you need to get vaccinated against meningococcal disease. You may also need additional booster doses.  Hepatitis A vaccine.** / Consult your health care provider.  Hepatitis B vaccine.** / Consult your health care provider.  Haemophilus influenzae type b (Hib) vaccine.** / Consult your health care provider. Ages 40 to 64 years  Blood pressure check.** / Every 1 to 2 years.  Lipid and cholesterol check.** / Every 5 years beginning at age 20 years.  Lung cancer screening. / Every year if you are aged 55-80 years and have a 30-pack-year history of smoking and currently smoke or have quit within the past 15 years. Yearly screening is stopped once you have quit smoking for at least 15 years or develop a health problem that would prevent you from having lung cancer treatment.  Clinical breast exam.** / Every year after age 40 years.  BRCA-related cancer risk assessment.** / For women who have family members with a BRCA-related cancer (breast, ovarian, tubal, or peritoneal cancers).  Mammogram.** / Every year beginning at age 40 years and continuing for as long as you are in good health. Consult with your health care provider.  Pap test.** / Every 3 years starting at age 30 years through age 65 or 70 years with a history of 3 consecutive normal Pap tests.  HPV screening.** / Every 3 years from ages 30 years through ages 65 to 70 years with a history of 3 consecutive normal Pap tests.  Fecal occult blood test (FOBT) of stool. / Every year beginning at age 50 years and continuing until age 75 years. You may not need to do this test if you get a colonoscopy every 10 years.  Flexible sigmoidoscopy or colonoscopy.** / Every 5 years for a flexible sigmoidoscopy or every 10 years for a colonoscopy beginning at age 50 years and continuing until age 75 years.  Hepatitis C blood test.** / For all people born from 1945 through  1965 and any individual with known risks for hepatitis C.  Skin self-exam. / Monthly.  Influenza vaccine. / Every year.  Tetanus, diphtheria, and acellular pertussis (Tdap/Td) vaccine.** / Consult your health care provider. Pregnant women should receive 1 dose of Tdap vaccine during each pregnancy. 1 dose of Td every 10 years.  Varicella vaccine.** / Consult your health care provider. Pregnant females who do not have evidence of immunity should receive the first dose after pregnancy.  Zoster vaccine.** / 1 dose for adults aged 60 years or older.  Measles, mumps, rubella (MMR) vaccine.** / You need at least 1 dose of MMR if you were born in 1957 or later. You may also need a 2nd dose. For females of childbearing age, rubella immunity should be determined. If there is no evidence of immunity, females who are not pregnant should be vaccinated. If there is no evidence of immunity, females who are pregnant should delay immunization until after pregnancy.  Pneumococcal 13-valent conjugate (PCV13) vaccine.** / Consult your health care provider.  Pneumococcal polysaccharide (PPSV23) vaccine.** / 1 to 2 doses if you smoke cigarettes or if you have certain conditions.  Meningococcal vaccine.** / Consult your health care provider.  Hepatitis A vaccine.** / Consult your health care provider.  Hepatitis B vaccine.** / Consult your health care provider.  Haemophilus influenzae type b (Hib) vaccine.** / Consult your health care provider. Ages 65   years and over  Blood pressure check.** / Every 1 to 2 years.  Lipid and cholesterol check.** / Every 5 years beginning at age 22 years.  Lung cancer screening. / Every year if you are aged 73-80 years and have a 30-pack-year history of smoking and currently smoke or have quit within the past 15 years. Yearly screening is stopped once you have quit smoking for at least 15 years or develop a health problem that would prevent you from having lung cancer  treatment.  Clinical breast exam.** / Every year after age 4 years.  BRCA-related cancer risk assessment.** / For women who have family members with a BRCA-related cancer (breast, ovarian, tubal, or peritoneal cancers).  Mammogram.** / Every year beginning at age 40 years and continuing for as long as you are in good health. Consult with your health care provider.  Pap test.** / Every 3 years starting at age 9 years through age 34 or 91 years with 3 consecutive normal Pap tests. Testing can be stopped between 65 and 70 years with 3 consecutive normal Pap tests and no abnormal Pap or HPV tests in the past 10 years.  HPV screening.** / Every 3 years from ages 57 years through ages 64 or 45 years with a history of 3 consecutive normal Pap tests. Testing can be stopped between 65 and 70 years with 3 consecutive normal Pap tests and no abnormal Pap or HPV tests in the past 10 years.  Fecal occult blood test (FOBT) of stool. / Every year beginning at age 15 years and continuing until age 17 years. You may not need to do this test if you get a colonoscopy every 10 years.  Flexible sigmoidoscopy or colonoscopy.** / Every 5 years for a flexible sigmoidoscopy or every 10 years for a colonoscopy beginning at age 86 years and continuing until age 71 years.  Hepatitis C blood test.** / For all people born from [birth year] through 1966 and any individual with known risks for hepatitis C.  Osteoporosis screening.** / A one-time screening for women ages 83 years and over and women at risk for fractures or osteoporosis.  Skin self-exam. / Monthly.  Influenza vaccine. / Every year.  Tetanus, diphtheria, and acellular pertussis (Tdap/Td) vaccine.** / 1 dose of Td every 10 years.  Varicella vaccine.** / Consult your health care provider.  Zoster vaccine.** / 1 dose for adults aged 61 years or older.  Pneumococcal 13-valent conjugate (PCV13) vaccine.** / Consult your health care provider.  Pneumococcal  polysaccharide (PPSV23) vaccine.** / 1 dose for all adults aged 28 years and older.  Meningococcal vaccine.** / Consult your health care provider.  Hepatitis A vaccine.** / Consult your health care provider.  Hepatitis B vaccine.** / Consult your health care provider.  Haemophilus influenzae type b (Hib) vaccine.** / Consult your health care provider. ** Family history and personal history of risk and conditions may change your health care provider's recommendations. Document Released: 07/11/2001 Document Revised: 09/29/2013 Document Reviewed: 10/10/2010 [facility] Patient Information 2015 [place], [place]. This information is not intended to replace advice given to you by your health care provider. Make sure you discuss any questions you have with your health care provider.

## 2014-12-22 NOTE — Progress Notes (Signed)
Subjective:     Jenny Cohen is a 49 y.o. female and is here for a comprehensive physical exam. The patient reports no problems.  History   Social History  . Marital Status: Divorced    Spouse Name: N/A  . Number of Children: N/A  . Years of Education: N/A   Occupational History  .      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 Smoking status: Never Smoker   . Smokeless tobacco: Never Used  . Alcohol Use: 0.0 oz/week    0 Standard drinks or equivalent per week     Comment: social  . Drug Use: No  . Sexual Activity:    Partners: Male    Birth Control/ Protection: Condom   Other Topics Concern  . Not on file   Social History Narrative   Exercise--= daily 45 min and zumba 3 days a week   Health Maintenance  Topic Date Due  . HIV Screening  03/07/1980  . TETANUS/TDAP  03/07/1984  . INFLUENZA VACCINE  12/28/2014  . PAP SMEAR  07/30/2016    The following portions of the patient's history were reviewed and updated as appropriate:  She  has a past medical history of DUB (dysfunctional uterine bleeding); Anemia; Morbid obesity; and Chicken pox. She  does not have a problem list on file. She  has past surgical history that includes Cesarean section; Endometrial ablation; laparoscopy (1990); Partial hysterectomy; and Dilation and curettage of uterus. Her family history includes Diabetes in her mother; Hyperlipidemia in her father; Hypertension in her father; Sarcoidosis in her mother. She  reports that she has never smoked. She has never used smokeless tobacco. She reports that she drinks alcohol. She reports that she does not use illicit drugs. She has a current medication list which includes the following prescription(s): ibuprofen and naproxen. Current Outpatient Prescriptions on File Prior to Visit  Medication Sig Dispense Refill  . ibuprofen (ADVIL,MOTRIN) 200 MG tablet Take 200 mg by mouth every 6 (six) hours as needed for moderate pain.      . naproxen (NAPROSYN) 500 MG tablet Take 1 tablet (500 mg total) by mouth 2 (two) times daily with a meal. 30 tablet 0   No current facility-administered medications on file prior to visit.   She is allergic to miconazole nitrate and vagisil..  Review of Systems Review of Systems  Constitutional: Negative for activity change, appetite change and fatigue.  HENT: Negative for hearing loss, congestion, tinnitus and ear discharge.  dentist q26m Eyes: Negative for visual disturbance (see optho q1y -- vision corrected to 20/20 with glasses).  Respiratory: Negative for cough, chest tightness and shortness of breath.   Cardiovascular: Negative for chest pain, palpitations and leg swelling.  Gastrointestinal: Negative for abdominal pain, diarrhea, constipation and abdominal distention.  Genitourinary: Negative for urgency, frequency, decreased urine volume and difficulty urinating.  Musculoskeletal: Negative for back pain, arthralgias and gait problem.  Skin: Negative for color change, pallor and rash.  Neurological: Negative for dizziness, light-headedness, numbness and headaches.  Hematological: Negative for adenopathy. Does not bruise/bleed easily.  Psychiatric/Behavioral: Negative for suicidal ideas, confusion, sleep disturbance, self-injury, dysphoric mood, decreased concentration and agitation.       Objective:    BP 114/72 mmHg  Pulse 78  Temp(Src) 99.5 F (37.5 C) (Oral)  Ht 5' 5.75" (1.67 m)  Wt 282 lb (127.914 kg)  BMI 45.87 kg/m2  SpO2 98%  LMP 07/14/2013 General appearance: alert, cooperative, appears stated  age and no distress Head: Normocephalic, without obvious abnormality, atraumatic Eyes: conjunctivae/corneas clear. PERRL, EOM's intact. Fundi benign. Ears: normal TM's and external ear canals both ears Nose: Nares normal. Septum midline. Mucosa normal. No drainage or sinus tenderness. Throat: lips, mucosa, and tongue normal; teeth and gums normal Neck: no adenopathy,  no carotid bruit, no JVD, supple, symmetrical, trachea midline and thyroid not enlarged, symmetric, no tenderness/mass/nodules Back: symmetric, no curvature. ROM normal. No CVA tenderness. Lungs: clear to auscultation bilaterally Breasts: normal appearance, no masses or tenderness Heart: S1, S2 normal Abdomen: soft, non-tender; bowel sounds normal; no masses,  no organomegaly Pelvic: cervix normal in appearance, external genitalia normal, no adnexal masses or tenderness, no cervical motion tenderness, rectovaginal septum normal, uterus normal size, shape, and consistency, vagina normal without discharge and pap done Extremities: extremities normal, atraumatic, no cyanosis or edema Pulses: 2+ and symmetric Skin: Skin color, texture, turgor normal. No rashes or lesions Lymph nodes: Cervical, supraclavicular, and axillary nodes normal. Neurologic: Alert and oriented X 3, normal strength and tone. Normal symmetric reflexes. Normal coordination and gait     Assessment:    Healthy female exam.     Plan:    ghm utd Check labs See After Visit Summary for Counseling Recommendations    1. Preventative health care   - Basic metabolic panel - CBC with Differential/Platelet - Hepatic function panel - Lipid panel - HIV antibody - POCT urinalysis dipstick - TSH - RPR - HSV 2 antibody, IgG - Cytology - PAP  2. Need for diphtheria-tetanus-pertussis (Tdap) vaccine, adult/adolescent   - Tdap vaccine greater than or equal to 7yo IM  3. Screening for malignant neoplasm of cervix   - Cytology - PAP

## 2014-12-22 NOTE — Progress Notes (Signed)
Pre visit review using our clinic review tool, if applicable. No additional management support is needed unless otherwise documented below in the visit note. 

## 2014-12-23 LAB — LIPID PANEL
Cholesterol: 197 mg/dL (ref 0–200)
HDL: 45.9 mg/dL (ref >39.00)
LDL Cholesterol: 132 mg/dL — ABNORMAL HIGH (ref 0–99)
NonHDL: 151.1
TRIGLYCERIDES: 96 mg/dL (ref 0.0–149.0)
Total CHOL/HDL Ratio: 4
VLDL: 19.2 mg/dL (ref 0.0–40.0)

## 2014-12-23 LAB — HIV ANTIBODY (ROUTINE TESTING W REFLEX): HIV 1&2 Ab, 4th Generation: NONREACTIVE

## 2014-12-23 LAB — HSV 2 ANTIBODY, IGG: HSV 2 Glycoprotein G Ab, IgG: <0.1 IV

## 2014-12-23 LAB — CBC WITH DIFFERENTIAL/PLATELET
Basophils Absolute: 0.1 10*3/uL (ref 0.0–0.1)
Basophils Relative: 1.1 % (ref 0.0–3.0)
Eosinophils Absolute: 0.1 10*3/uL (ref 0.0–0.7)
Eosinophils Relative: 1.4 % (ref 0.0–5.0)
HEMATOCRIT: 40.5 % (ref 36.0–46.0)
HEMOGLOBIN: 12.9 g/dL (ref 12.0–15.0)
Lymphocytes Relative: 35.9 % (ref 12.0–46.0)
Lymphs Abs: 2.2 10*3/uL (ref 0.7–4.0)
MCHC: 31.9 g/dL (ref 30.0–36.0)
MCV: 80.3 fl (ref 78.0–100.0)
Monocytes Absolute: 0.3 10*3/uL (ref 0.1–1.0)
Monocytes Relative: 4.3 % (ref 3.0–12.0)
Neutro Abs: 3.5 10*3/uL (ref 1.4–7.7)
Neutrophils Relative %: 57.3 % (ref 43.0–77.0)
Platelets: 490 10*3/uL — ABNORMAL HIGH (ref 150.0–400.0)
RBC: 5.04 Mil/uL (ref 3.87–5.11)
RDW: 16.7 % — ABNORMAL HIGH (ref 11.5–15.5)
WBC: 6.1 10*3/uL (ref 4.0–10.5)

## 2014-12-23 LAB — RPR

## 2014-12-23 LAB — HEPATIC FUNCTION PANEL
ALT: 11 U/L (ref 0–35)
AST: 13 U/L (ref 0–37)
Albumin: 3.7 g/dL (ref 3.5–5.2)
Alkaline Phosphatase: 108 U/L (ref 39–117)
Bilirubin, Direct: 0.1 mg/dL (ref 0.0–0.3)
TOTAL PROTEIN: 7.6 g/dL (ref 6.0–8.3)
Total Bilirubin: 0.5 mg/dL (ref 0.2–1.2)

## 2014-12-23 LAB — BASIC METABOLIC PANEL
BUN: 9 mg/dL (ref 6–23)
CALCIUM: 9 mg/dL (ref 8.4–10.5)
CO2: 26 mEq/L (ref 19–32)
Chloride: 105 mEq/L (ref 96–112)
Creatinine, Ser: 0.71 mg/dL (ref 0.40–1.20)
GFR: 112.15 mL/min (ref >60.00)
Glucose, Bld: 76 mg/dL (ref 70–99)
POTASSIUM: 3.9 meq/L (ref 3.5–5.1)
SODIUM: 138 meq/L (ref 135–145)

## 2014-12-23 LAB — TSH: TSH: 2.47 u[IU]/mL (ref 0.35–4.50)

## 2014-12-25 LAB — CYTOLOGY - PAP

## 2014-12-25 NOTE — Addendum Note (Signed)
Addended by: Leticia Penna A on: 12/25/2014 11:06 AM   Modules accepted: Orders

## 2015-01-04 ENCOUNTER — Other Ambulatory Visit: Payer: Self-pay

## 2015-01-08 ENCOUNTER — Other Ambulatory Visit (INDEPENDENT_AMBULATORY_CARE_PROVIDER_SITE_OTHER): Payer: Self-pay

## 2015-01-08 DIAGNOSIS — E7800 Pure hypercholesterolemia, unspecified: Secondary | ICD-10-CM

## 2015-01-08 LAB — LIPID PANEL
CHOLESTEROL: 193 mg/dL (ref 0–200)
HDL: 43.3 mg/dL (ref >39.00)
LDL CALC: 133 mg/dL — AB (ref 0–99)
NonHDL: 149.46
TRIGLYCERIDES: 82 mg/dL (ref 0.0–149.0)
Total CHOL/HDL Ratio: 4
VLDL: 16.4 mg/dL (ref 0.0–40.0)

## 2015-01-08 LAB — HEPATIC FUNCTION PANEL
ALT: 12 U/L (ref 0–35)
AST: 12 U/L (ref 0–37)
Albumin: 3.5 g/dL (ref 3.5–5.2)
Alkaline Phosphatase: 108 U/L (ref 39–117)
BILIRUBIN DIRECT: 0 mg/dL (ref 0.0–0.3)
Total Bilirubin: 0.4 mg/dL (ref 0.2–1.2)
Total Protein: 7.1 g/dL (ref 6.0–8.3)

## 2015-01-20 LAB — CERVICOVAGINAL ANCILLARY ONLY: CANDIDA VAGINITIS: NEGATIVE

## 2015-01-22 ENCOUNTER — Telehealth: Payer: Self-pay

## 2015-01-22 MED ORDER — METRONIDAZOLE 500 MG PO TABS: 500.0000 mg | ORAL_TABLET | Freq: Two times a day (BID) | ORAL | Status: DC

## 2015-01-22 MED ORDER — FLUCONAZOLE 150 MG PO TABS: 150.0000 mg | ORAL_TABLET | Freq: Once | ORAL | Status: DC

## 2015-01-22 NOTE — Telephone Encounter (Signed)
-----   Message from Rosalita Chessman, DO sent at 01/21/2015  1:32 PM EDT ----- + BV--- flagyl 500 mg bid x 7 days

## 2015-01-22 NOTE — Telephone Encounter (Signed)
Patient aware and verbalized understanding. Rx faxed.     KP

## 2015-03-19 ENCOUNTER — Other Ambulatory Visit: Payer: Self-pay

## 2015-03-19 ENCOUNTER — Other Ambulatory Visit (INDEPENDENT_AMBULATORY_CARE_PROVIDER_SITE_OTHER): Payer: Self-pay

## 2015-03-19 DIAGNOSIS — E785 Hyperlipidemia, unspecified: Secondary | ICD-10-CM

## 2015-03-19 LAB — LIPID PANEL
CHOLESTEROL: 187 mg/dL (ref 0–200)
HDL: 44.9 mg/dL (ref >39.00)
LDL Cholesterol: 125 mg/dL — ABNORMAL HIGH (ref 0–99)
NONHDL: 142.01
Total CHOL/HDL Ratio: 4
Triglycerides: 85 mg/dL (ref 0.0–149.0)
VLDL: 17 mg/dL (ref 0.0–40.0)

## 2015-06-28 ENCOUNTER — Other Ambulatory Visit: Payer: Self-pay | Admitting: Family Medicine

## 2015-06-29 ENCOUNTER — Ambulatory Visit: Payer: Self-pay

## 2015-06-29 MED ORDER — FLUCONAZOLE 150 MG PO TABS: 150.0000 mg | ORAL_TABLET | Freq: Once | ORAL | Status: DC

## 2015-06-29 MED ORDER — METRONIDAZOLE 500 MG PO TABS: 500.0000 mg | ORAL_TABLET | Freq: Two times a day (BID) | ORAL | Status: DC

## 2015-12-15 ENCOUNTER — Encounter (HOSPITAL_COMMUNITY): Payer: Self-pay | Admitting: Emergency Medicine

## 2015-12-15 ENCOUNTER — Emergency Department (HOSPITAL_COMMUNITY)
Admission: EM | Admit: 2015-12-15 | Discharge: 2015-12-15 | Disposition: A | Discharge status: Home / Self Care | Payer: Self-pay | Attending: Emergency Medicine | Admitting: Emergency Medicine

## 2015-12-15 DIAGNOSIS — Z6841 Body Mass Index (BMI) 40.0 and over, adult: Secondary | ICD-10-CM | POA: Insufficient documentation

## 2015-12-15 DIAGNOSIS — L309 Dermatitis, unspecified: Secondary | ICD-10-CM | POA: Insufficient documentation

## 2015-12-15 MED ORDER — TRIAMCINOLONE ACETONIDE 0.1 % EX CREA: 1.0000 "application " | TOPICAL_CREAM | Freq: Two times a day (BID) | CUTANEOUS | Status: DC

## 2015-12-15 NOTE — ED Notes (Signed)
Pt given discharge instructions, verbalized understanding of need to follow up, reasons to return to the ED and medications to take at home. Pt denied further questions or concerns. Pt able to ambulate to exit without difficulty.

## 2015-12-15 NOTE — ED Provider Notes (Signed)
CSN: [id number]     Arrival date & time 12/15/15  1811 History  By signing my name below, I, Jenny Cohen, attest that this documentation has been prepared under the direction and in the presence of Shawn Joy, PA-C. Electronically Signed: Georgette Cohen, ED Scribe. 12/15/2015. 8:14 PM.   Chief Complaint  Patient presents with  . Insect Bite   The history is provided by the patient. No language interpreter was used.   HPI Comments: Jenny Cohen is a 50 y.o. female who presents to the Emergency Department complaining of a pruritic rash on her bilateral ears onset two days ago. Pt thinks it is an insect bite but states she didn't feel or see an insect bite her. Pt has tried Benadryl and Cortisone with no relief. No new soaps, lotions, detergents, foods, animals, plants, or medications. Pt has a PCP she regularly follows up with. Pt denies diaphoresis, ear drainage, fever/chills, or any other complaints.     Past Medical History  Diagnosis Date  . DUB (dysfunctional uterine bleeding)   . Anemia   . Morbid obesity (HCC)     BMI 47  . Chicken pox    Past Surgical History  Procedure Laterality Date  . Cesarean section    . Endometrial ablation    . Laparoscopy  1990    with laparotomy  . Partial hysterectomy    . Dilation and curettage of uterus     Family History  Problem Relation Age of Onset  . Hyperlipidemia Father   . Hypertension Father   . Diabetes Mother   . Sarcoidosis Mother    Social History  Substance Use Topics  . Smoking status: Never Smoker   . Smokeless tobacco: Never Used  . Alcohol Use: 0.0 oz/week    0 Standard drinks or equivalent per week     Comment: social   OB History    Gravida Para Term Preterm AB TAB SAB Ectopic Multiple Living   1 1 1             Review of Systems  Constitutional: Negative for fever and diaphoresis.  HENT: Negative for ear discharge.   Gastrointestinal: Negative for nausea and vomiting.  Skin: Positive for rash.       Allergies  Miconazole nitrate and Vagisil  Home Medications   Prior to Admission medications   Medication Sig Start Date End Date Taking? Authorizing Provider  fluconazole (DIFLUCAN) 150 MG tablet Take 1 tablet (150 mg total) by mouth once. And repeat in 3 days of needed 06/29/15   Rosalita Chessman Chase, DO  ibuprofen (ADVIL,MOTRIN) 200 MG tablet Take 200 mg by mouth every 6 (six) hours as needed for moderate pain.     Historical Provider, MD  metroNIDAZOLE (FLAGYL) 500 MG tablet Take 1 tablet (500 mg total) by mouth 2 (two) times daily. 06/29/15   Rosalita Chessman Chase, DO  naproxen (NAPROSYN) 500 MG tablet Take 1 tablet (500 mg total) by mouth 2 (two) times daily with a meal. 06/02/14   Noemi Chapel, MD  triamcinolone cream (KENALOG) 0.1 % Apply 1 application topically 2 (two) times daily. 12/15/15   Shawn C Joy, PA-C   BP 113/82 mmHg  Pulse 75  Temp(Src) 98.8 F (37.1 C) (Oral)  Resp 18  Ht 5\' 5"  (1.651 m)  Wt 122.471 kg  BMI 44.93 kg/m2  SpO2 99%  LMP 07/14/2013 Physical Exam  Constitutional: She appears well-developed and well-nourished. No distress.  HENT:  Head: Normocephalic and atraumatic.  No cervical lymphadenopathy.   Eyes: Conjunctivae are normal.  Neck: Neck supple.  Cardiovascular: Normal rate and regular rhythm.   Pulmonary/Chest: Effort normal. No respiratory distress.  Abdominal: She exhibits no distension.  Musculoskeletal: Normal range of motion.  Lymphadenopathy:    She has no cervical adenopathy.  Neurological: She is alert.  Skin: Skin is warm and dry. She is not diaphoretic.  Fine, erythematous, papular rash bilaterally on external ears and immediate surrounding area. These lesions are also noted on the forehead along the hairline and on the back of the neck also along the hairline. No pustules. No area of induration.   Psychiatric: She has a normal mood and affect. Her behavior is normal.  Nursing note and vitals reviewed.   ED Course   Procedures  DIAGNOSTIC STUDIES: Oxygen Saturation is 99% on RA, normal by my interpretation.    COORDINATION OF CARE: 8:13 PM Discussed treatment plan with pt at bedside which includes Rx of steroid cream and pt agreed to plan.  MDM   Final diagnoses:  Dermatitis    Jenny Cohen presents with small, pruritic, erythematous rash in both of her ears.  Suspect variation of seborrheic dermatitis. The patient was given instructions for home care as well as return precautions. Patient voices understanding of these instructions, accepts the plan, and is comfortable with discharge.  I personally performed the services described in this documentation, which was scribed in my presence. The recorded information has been reviewed and is accurate.   Lorayne Bender, PA-C 12/15/15 2037   Lajean Saver, MD 12/21/15 [id number]

## 2015-12-15 NOTE — Discharge Instructions (Signed)
You have been seen today for a rash. Use the triamcinolone cream twice a day for as long as the rash is present. Follow up with PCP as needed should symptoms fail to resolve. Return to ED should symptoms worsen.

## 2015-12-15 NOTE — ED Notes (Signed)
Per patient, she noticed a break-out on her face, in both ears, and back neck.  She didn't feel or see a bite.  Denies any changes in diet, fabric softener.   Itching on face and right ear.

## 2016-02-29 ENCOUNTER — Telehealth: Payer: Self-pay | Admitting: Family Medicine

## 2016-02-29 NOTE — Telephone Encounter (Signed)
Relation to [id number] Call back number:[phone] Pharmacy:  Reason for call:  Patient requesting colonoscopy orders. Patient scheduled physical for 05/08/2016

## 2016-02-29 NOTE — Telephone Encounter (Signed)
Is she having problems or does she just want a screening?

## 2016-03-01 NOTE — Telephone Encounter (Signed)
As per patient at last physical appointment PCP stated next year physical due to patient age a screening would be needed. Patient states she is not having any issues or concerns.

## 2016-04-23 ENCOUNTER — Emergency Department (HOSPITAL_COMMUNITY): Payer: Self-pay

## 2016-04-23 ENCOUNTER — Encounter (HOSPITAL_COMMUNITY): Payer: Self-pay

## 2016-04-23 ENCOUNTER — Emergency Department (HOSPITAL_COMMUNITY)
Admission: EM | Admit: 2016-04-23 | Discharge: 2016-04-24 | Disposition: A | Discharge status: Home / Self Care | Payer: Self-pay | Attending: Emergency Medicine | Admitting: Emergency Medicine

## 2016-04-23 DIAGNOSIS — Z79899 Other long term (current) drug therapy: Secondary | ICD-10-CM | POA: Insufficient documentation

## 2016-04-23 DIAGNOSIS — R112 Nausea with vomiting, unspecified: Secondary | ICD-10-CM

## 2016-04-23 DIAGNOSIS — R1011 Right upper quadrant pain: Secondary | ICD-10-CM

## 2016-04-23 DIAGNOSIS — R109 Unspecified abdominal pain: Secondary | ICD-10-CM

## 2016-04-23 DIAGNOSIS — R197 Diarrhea, unspecified: Secondary | ICD-10-CM | POA: Insufficient documentation

## 2016-04-23 HISTORY — DX: Benign neoplasm of connective and other soft tissue, unspecified: D21.9

## 2016-04-23 LAB — CBC
HEMATOCRIT: 42.1 % (ref 36.0–46.0)
HEMOGLOBIN: 13.4 g/dL (ref 12.0–15.0)
MCH: 27.5 pg (ref 26.0–34.0)
MCHC: 31.8 g/dL (ref 30.0–36.0)
MCV: 86.3 fL (ref 78.0–100.0)
Platelets: 450 10*3/uL — ABNORMAL HIGH (ref 150–400)
RBC: 4.88 MIL/uL (ref 3.87–5.11)
RDW: 15.2 % (ref 11.5–15.5)
WBC: 4.5 10*3/uL (ref 4.0–10.5)

## 2016-04-23 LAB — COMPREHENSIVE METABOLIC PANEL
ALBUMIN: 3.7 g/dL (ref 3.5–5.0)
ALK PHOS: 97 U/L (ref 38–126)
ALT: 14 U/L (ref 14–54)
ANION GAP: 6 (ref 5–15)
AST: 15 U/L (ref 15–41)
BUN: 13 mg/dL (ref 6–20)
CALCIUM: 8.7 mg/dL — AB (ref 8.9–10.3)
CO2: 24 mmol/L (ref 22–32)
Chloride: 107 mmol/L (ref 101–111)
Creatinine, Ser: 0.73 mg/dL (ref 0.44–1.00)
GFR calc non Af Amer: >60 mL/min (ref >60)
GLUCOSE: 109 mg/dL — AB (ref 65–99)
POTASSIUM: 4 mmol/L (ref 3.5–5.1)
SODIUM: 137 mmol/L (ref 135–145)
Total Bilirubin: 0.8 mg/dL (ref 0.3–1.2)
Total Protein: 7.2 g/dL (ref 6.5–8.1)

## 2016-04-23 LAB — LIPASE, BLOOD: LIPASE: 19 U/L (ref 11–51)

## 2016-04-23 MED ORDER — ONDANSETRON 4 MG PO TBDP
4.0000 mg | ORAL_TABLET | Freq: Once | ORAL | Status: AC | PRN
  Administered 2016-04-23: 4 mg via ORAL
  Filled 2016-04-23: qty 1

## 2016-04-23 MED ORDER — SODIUM CHLORIDE 0.9 % IV BOLUS (SEPSIS)
1000.0000 mL | Freq: Once | INTRAVENOUS | Status: AC
  Administered 2016-04-23: 1000 mL via INTRAVENOUS

## 2016-04-23 MED ORDER — IOPAMIDOL (ISOVUE-300) INJECTION 61%
100.0000 mL | Freq: Once | INTRAVENOUS | Status: AC | PRN
  Administered 2016-04-23: 100 mL via INTRAVENOUS

## 2016-04-23 NOTE — ED Triage Notes (Signed)
PT C/O ABDOMINAL PAIN WITH N/V/D AND CHILLS SINCE LAST NIGHT. PT ALSO C/O LEFT SHOULDER PAIN, IN WHICH SHE HIT HER SHOULDER ON THE SHOWER WALL, AND PAIN DOWN THE BACK OF THE LEFT LEG. DENIES INJURY.

## 2016-04-23 NOTE — ED Notes (Signed)
Pt ambulatory to restroom

## 2016-04-23 NOTE — ED Notes (Signed)
Patient transported to CT 

## 2016-04-23 NOTE — ED Provider Notes (Signed)
[facility] DEPT Provider Note   CSN: [id number] Arrival date & time: 04/23/16  2007     History   Chief Complaint Chief Complaint  Patient presents with  . Emesis  . Abdominal Pain    HPI GRAVIELA Cohen is a 50 y.o. female.  The history is provided by the patient. No language interpreter was used.  Emesis   Associated symptoms include abdominal pain.  Abdominal Pain   Associated symptoms include vomiting.    Jenny Cohen is a 50 y.o. female who presents to the Emergency Department complaining of abdominal pain, vomiting.  She reports vomiting, diarrhea and right-sided abdominal pain that began last night. She initially began with severe vomiting and diarrhea followed by right sided abdominal pain. Her vomiting has decreased but she reports persistent nausea. She has associated chills. She reports severe right-sided abdominal pain that has been constant and gradually worsening. She does report that her father was recently diagnosed with a GI bug that she last saw him 3 days ago. Symptoms are moderate to severe, constant and worsening.  Past Medical History:  Diagnosis Date  . Anemia   . Chicken pox   . DUB (dysfunctional uterine bleeding)   . Fibroids   . Morbid obesity ([place])    BMI 47    There are no active problems to display for this patient.   Past Surgical History:  Procedure Laterality Date  . CESAREAN SECTION    . DILATION AND CURETTAGE OF UTERUS    . ENDOMETRIAL ABLATION    . LAPAROSCOPY  1990   with laparotomy  . PARTIAL HYSTERECTOMY      OB History    Gravida Para Term Preterm AB Living   1 1 1          SAB TAB Ectopic Multiple Live Births                   Home Medications    Prior to Admission medications   Medication Sig Start Date End Date Taking? Authorizing Provider  naproxen sodium (ANAPROX) 220 MG tablet Take 440 mg by mouth 2 (two) times daily as needed (pain).   Yes Historical Provider, MD  dicyclomine (BENTYL) 20 MG tablet  Take 1 tablet (20 mg total) by mouth 2 (two) times daily as needed for spasms. 04/24/16   Quintella Reichert, MD  fluconazole (DIFLUCAN) 150 MG tablet Take 1 tablet (150 mg total) by mouth once. And repeat in 3 days of needed Patient not taking: Reported on 04/23/2016 06/29/15   Rosalita Chessman Chase, DO  metroNIDAZOLE (FLAGYL) 500 MG tablet Take 1 tablet (500 mg total) by mouth 2 (two) times daily. Patient not taking: Reported on 04/23/2016 06/29/15   Rosalita Chessman Chase, DO  naproxen (NAPROSYN) 500 MG tablet Take 1 tablet (500 mg total) by mouth 2 (two) times daily with a meal. Patient not taking: Reported on 04/23/2016 06/02/14   Noemi Chapel, MD  ondansetron (ZOFRAN) 4 MG tablet Take 1 tablet (4 mg total) by mouth every 6 (six) hours. 04/24/16   Quintella Reichert, MD  triamcinolone cream (KENALOG) 0.1 % Apply 1 application topically 2 (two) times daily. Patient not taking: Reported on 04/23/2016 12/15/15   Lorayne Bender, PA-C    Family History Family History  Problem Relation Age of Onset  . Hyperlipidemia Father   . Hypertension Father   . Diabetes Mother   . Sarcoidosis Mother     Social History Social History  Substance Use Topics  .  Smoking status: Never Smoker  . Smokeless tobacco: Never Used  . Alcohol use 0.0 oz/week     Comment: social     Allergies   Miconazole nitrate and Vagisil   Review of Systems Review of Systems  Gastrointestinal: Positive for abdominal pain and vomiting.  All other systems reviewed and are negative.    Physical Exam Updated Vital Signs BP 108/60 (BP Location: Left Arm)   Pulse 70   Temp 98.5 F (36.9 C) (Oral)   Resp 20   Ht 5\' 5"  (1.651 m)   Wt 280 lb (127 kg)   LMP 07/14/2013   SpO2 94%   BMI 46.59 kg/m   Physical Exam  Constitutional: She is oriented to person, place, and time. She appears well-developed and well-nourished.  HENT:  Head: Normocephalic and atraumatic.  Cardiovascular: Normal rate and regular rhythm.   No murmur  heard. Pulmonary/Chest: Effort normal and breath sounds normal. No respiratory distress.  Abdominal: Soft. There is no rebound.  Moderate right sided abdominal tenderness, greatest over the RUQ with voluntary guarding, no rebound.    Musculoskeletal: She exhibits no edema or tenderness.  Neurological: She is alert and oriented to person, place, and time.  Skin: Skin is warm and dry.  Psychiatric: She has a normal mood and affect. Her behavior is normal.  Nursing note and vitals reviewed.    ED Treatments / Results  Labs (all labs ordered are listed, but only abnormal results are displayed) Labs Reviewed  COMPREHENSIVE METABOLIC PANEL - Abnormal; Notable for the following:       Result Value   Glucose, Bld 109 (*)    Calcium 8.7 (*)    All other components within normal limits  CBC - Abnormal; Notable for the following:    Platelets 450 (*)    All other components within normal limits  URINALYSIS, ROUTINE W REFLEX MICROSCOPIC (NOT AT [facility]) - Abnormal; Notable for the following:    Specific Gravity, Urine >1.046 (*)    All other components within normal limits  LIPASE, BLOOD    EKG  EKG Interpretation None       Radiology Ct Abdomen Pelvis W Contrast  Result Date: 04/23/2016 CLINICAL DATA:  50 y/o F; abdominal pain with nausea, vomiting, diarrhea. EXAM: CT ABDOMEN AND PELVIS WITH CONTRAST TECHNIQUE: Multidetector CT imaging of the abdomen and pelvis was performed using the standard protocol following bolus administration of intravenous contrast. CONTRAST:  182mL ISOVUE-300 IOPAMIDOL (ISOVUE-300) INJECTION 61% COMPARISON:  07/20/2013 CT abdomen and pelvis. FINDINGS: Lower chest: No acute abnormality. Hepatobiliary: 12 mm and 34 mm in caudate lobe likely cysts, minimally increased from prior study, and additional stable punctate lucency in segment 7. Otherwise no focal liver abnormality is seen. No gallstones, gallbladder wall thickening, or biliary dilatation. Pancreas:  Unremarkable. No pancreatic ductal dilatation or surrounding inflammatory changes. Spleen: Normal in size without focal abnormality. Adrenals/Urinary Tract: Adrenal glands are unremarkable. Left kidney punctate nonobstructing caliceal stones. Kidneys are otherwise normal, without focal lesion or hydronephrosis. Bladder is unremarkable. Stomach/Bowel: Stomach is within normal limits. Appendix appears normal. No evidence of bowel wall thickening, distention, or inflammatory changes. Vascular/Lymphatic: No significant vascular findings are present. No enlarged abdominal or pelvic lymph nodes. Minimal calcific atherosclerosis of common iliacs. Reproductive: Status post hysterectomy. No adnexal masses. Other: Midline lower abdominal postsurgical changes. Small paraumbilical hernia containing fat new from prior study. Musculoskeletal: Lumbar spine spondylosis with prominent facet arthropathy. IMPRESSION: 1. No acute process identified as explanation for pain. Normal appendix. 2.  New small paraumbilical hernia containing fat. 3. Left kidney stable nonobstructive punctate caliceal stones. Electronically Signed   By: Kristine Garbe M.D.   On: 04/23/2016 23:48   US Abdomen Limited Ruq  Result Date: 04/24/2016 CLINICAL DATA:  Right upper quadrant pain, nausea, vomiting, and diarrhea. Symptoms since last night. History of partial hysterectomy. EXAM: US ABDOMEN LIMITED - RIGHT UPPER QUADRANT COMPARISON:  CT abdomen and pelvis 04/23/2016 FINDINGS: Gallbladder: No gallstones or wall thickening visualized. No sonographic Murphy sign noted by sonographer. Common bile duct: Diameter: 3.1 mm, normal Liver: Cyst in the caudate lobe of the liver measuring 2.7 cm maximal diameter. This is confirmed on CT. Somewhat heterogeneous appearance to the liver parenchyma may indicate areas of fatty infiltration. No solid lesions identified. IMPRESSION: No evidence of cholelithiasis or cholecystitis. Benign-appearing hepatic cyst.  Probable fatty infiltration in the liver. Electronically Signed   By: Lucienne Capers M.D.   On: 04/24/2016 01:39    Procedures Procedures (including critical care time)  Medications Ordered in ED Medications  ondansetron (ZOFRAN-ODT) disintegrating tablet 4 mg (4 mg Oral Given 04/23/16 2049)  sodium chloride 0.9 % bolus 1,000 mL (0 mLs Intravenous Stopped 04/24/16 0138)  iopamidol (ISOVUE-300) 61 % injection 100 mL (100 mLs Intravenous Contrast Given 04/23/16 2251)  ondansetron (ZOFRAN) injection 4 mg (4 mg Intravenous Given 04/24/16 0015)  morphine 4 MG/ML injection 4 mg (4 mg Intravenous Given 04/24/16 0016)     Initial Impression / Assessment and Plan / ED Course  I have reviewed the triage vital signs and the nursing notes.  Pertinent labs & imaging results that were available during my care of the patient were reviewed by me and considered in my medical decision making (see chart for details).  Clinical Course     Patient here for evaluation of fevers, abdominal pain, vomiting. She has significant right-sided abdominal tenderness on examination. CT abdomen is negative for acute appendicitis. Given her ongoing tenderness ultrasound was obtained that was negative for acute disease process. Following pain meds she is feeling improved with no recurrent vomiting in the department. Plan to DC home with outpatient follow-up. Discussed likely gastroenteritis. Discussed home care with oral fluid hydration, Tylenol and ibuprofen as needed for fever. Close return precautions discussed as well.  Final Clinical Impressions(s) / ED Diagnoses   Final diagnoses:  Abdominal pain  Nausea vomiting and diarrhea  Right upper quadrant abdominal pain    New Prescriptions New Prescriptions   DICYCLOMINE (BENTYL) 20 MG TABLET    Take 1 tablet (20 mg total) by mouth 2 (two) times daily as needed for spasms.   ONDANSETRON (ZOFRAN) 4 MG TABLET    Take 1 tablet (4 mg total) by mouth every 6 (six)  hours.     Quintella Reichert, MD 04/24/16 [phone]

## 2016-04-24 ENCOUNTER — Emergency Department (HOSPITAL_COMMUNITY): Payer: Self-pay

## 2016-04-24 LAB — URINALYSIS, ROUTINE W REFLEX MICROSCOPIC
BILIRUBIN URINE: NEGATIVE
Glucose, UA: NEGATIVE mg/dL
HGB URINE DIPSTICK: NEGATIVE
KETONES UR: NEGATIVE mg/dL
Leukocytes, UA: NEGATIVE
NITRITE: NEGATIVE
Protein, ur: NEGATIVE mg/dL
Specific Gravity, Urine: >1.046 — ABNORMAL HIGH (ref 1.005–1.030)
pH: 5 (ref 5.0–8.0)

## 2016-04-24 MED ORDER — ONDANSETRON HCL 4 MG PO TABS: 4.0000 mg | ORAL_TABLET | Freq: Four times a day (QID) | ORAL | 0 refills | Status: DC

## 2016-04-24 MED ORDER — DICYCLOMINE HCL 20 MG PO TABS: 20.0000 mg | ORAL_TABLET | Freq: Two times a day (BID) | ORAL | 0 refills | Status: DC | PRN

## 2016-04-24 MED ORDER — ONDANSETRON HCL 4 MG/2ML IJ SOLN
4.0000 mg | Freq: Once | INTRAMUSCULAR | Status: AC
  Administered 2016-04-24: 4 mg via INTRAVENOUS
  Filled 2016-04-24: qty 2

## 2016-04-24 MED ORDER — MORPHINE SULFATE (PF) 4 MG/ML IV SOLN
4.0000 mg | Freq: Once | INTRAVENOUS | Status: AC
  Administered 2016-04-24: 4 mg via INTRAVENOUS
  Filled 2016-04-24: qty 1

## 2016-04-24 NOTE — ED Notes (Signed)
Pt ambulatory and independent at discharge.  Verbalized understanding of discharge instructions 

## 2016-05-08 ENCOUNTER — Encounter: Payer: Self-pay | Admitting: Gastroenterology

## 2016-05-08 ENCOUNTER — Encounter: Payer: Self-pay | Admitting: Family Medicine

## 2016-05-08 ENCOUNTER — Ambulatory Visit (INDEPENDENT_AMBULATORY_CARE_PROVIDER_SITE_OTHER): Payer: Self-pay | Admitting: Family Medicine

## 2016-05-08 VITALS — BP 123/84 | HR 97 | Temp 99.6°F | Ht 65.0 in

## 2016-05-08 DIAGNOSIS — J0110 Acute frontal sinusitis, unspecified: Secondary | ICD-10-CM

## 2016-05-08 DIAGNOSIS — R319 Hematuria, unspecified: Secondary | ICD-10-CM

## 2016-05-08 DIAGNOSIS — M25512 Pain in left shoulder: Secondary | ICD-10-CM

## 2016-05-08 DIAGNOSIS — Z0000 Encounter for general adult medical examination without abnormal findings: Secondary | ICD-10-CM

## 2016-05-08 LAB — POC URINALSYSI DIPSTICK (AUTOMATED)
BILIRUBIN UA: NEGATIVE
GLUCOSE UA: NEGATIVE
KETONES UA: NEGATIVE
LEUKOCYTES UA: NEGATIVE
Nitrite, UA: NEGATIVE
Urobilinogen, UA: 0.2
pH, UA: 5.5

## 2016-05-08 LAB — CBC WITH DIFFERENTIAL/PLATELET
BASOS PCT: 0.3 % (ref 0.0–3.0)
Basophils Absolute: 0 10*3/uL (ref 0.0–0.1)
EOS PCT: 2.8 % (ref 0.0–5.0)
Eosinophils Absolute: 0.3 10*3/uL (ref 0.0–0.7)
HEMATOCRIT: 41.3 % (ref 36.0–46.0)
HEMOGLOBIN: 13.6 g/dL (ref 12.0–15.0)
LYMPHS PCT: 19.7 % (ref 12.0–46.0)
Lymphs Abs: 1.9 10*3/uL (ref 0.7–4.0)
MCHC: 33.1 g/dL (ref 30.0–36.0)
MCV: 83.5 fl (ref 78.0–100.0)
Monocytes Absolute: 0.8 10*3/uL (ref 0.1–1.0)
Monocytes Relative: 7.7 % (ref 3.0–12.0)
NEUTROS ABS: 6.8 10*3/uL (ref 1.4–7.7)
Neutrophils Relative %: 69.5 % (ref 43.0–77.0)
PLATELETS: 432 10*3/uL — AB (ref 150.0–400.0)
RBC: 4.94 Mil/uL (ref 3.87–5.11)
RDW: 15.9 % — AB (ref 11.5–15.5)
WBC: 9.8 10*3/uL (ref 4.0–10.5)

## 2016-05-08 LAB — TSH: TSH: 2.48 u[IU]/mL (ref 0.35–4.50)

## 2016-05-08 LAB — LIPID PANEL
CHOLESTEROL: 199 mg/dL (ref 0–200)
HDL: 52 mg/dL (ref >39.00)
LDL CALC: 130 mg/dL — AB (ref 0–99)
NonHDL: 146.63
TRIGLYCERIDES: 81 mg/dL (ref 0.0–149.0)
Total CHOL/HDL Ratio: 4
VLDL: 16.2 mg/dL (ref 0.0–40.0)

## 2016-05-08 LAB — COMPREHENSIVE METABOLIC PANEL
ALBUMIN: 3.8 g/dL (ref 3.5–5.2)
ALK PHOS: 95 U/L (ref 39–117)
ALT: 9 U/L (ref 0–35)
AST: 9 U/L (ref 0–37)
BUN: 8 mg/dL (ref 6–23)
CALCIUM: 9.1 mg/dL (ref 8.4–10.5)
CHLORIDE: 100 meq/L (ref 96–112)
CO2: 26 mEq/L (ref 19–32)
Creatinine, Ser: 0.7 mg/dL (ref 0.40–1.20)
GFR: 113.38 mL/min (ref >60.00)
Glucose, Bld: 98 mg/dL (ref 70–99)
POTASSIUM: 3.5 meq/L (ref 3.5–5.1)
SODIUM: 132 meq/L — AB (ref 135–145)
TOTAL PROTEIN: 7.7 g/dL (ref 6.0–8.3)
Total Bilirubin: 0.7 mg/dL (ref 0.2–1.2)

## 2016-05-08 MED ORDER — NAPROXEN 500 MG PO TABS: 500.0000 mg | ORAL_TABLET | Freq: Two times a day (BID) | ORAL | 2 refills | Status: DC

## 2016-05-08 MED ORDER — CETIRIZINE HCL 10 MG PO TABS: 10.0000 mg | ORAL_TABLET | Freq: Every day | ORAL | 11 refills | Status: DC

## 2016-05-08 MED ORDER — CEFDINIR 300 MG PO CAPS: 300.0000 mg | ORAL_CAPSULE | Freq: Two times a day (BID) | ORAL | 0 refills | Status: DC

## 2016-05-08 MED ORDER — FLUCONAZOLE 150 MG PO TABS: 150.0000 mg | ORAL_TABLET | Freq: Once | ORAL | 0 refills | Status: DC

## 2016-05-08 MED ORDER — FLUTICASONE PROPIONATE 50 MCG/ACT NA SUSP: 2.0000 | Freq: Every day | NASAL | 6 refills | Status: DC

## 2016-05-08 NOTE — Patient Instructions (Signed)

## 2016-05-08 NOTE — Progress Notes (Signed)
Pre visit review using our clinic tool,if applicable. No additional management support is needed unless otherwise documented below in the visit note.  

## 2016-05-08 NOTE — Progress Notes (Signed)
Subjective:     Jenny Cohen is a 50 y.o. female and is here for a comprehensive physical exam. The patient reports problems - cough, sinus pressure and sore throat--taking robitussin, delsym and halls with little relief. + fever 101 L shoulder pain x 1 month--- she thinks she hurt it in boot camp at gym. She is requesting ortho--  Naprosyn helps some Social History   Social History  . Marital status: Divorced    Spouse name: N/A  . Number of children: N/A  . Years of education: N/A   Occupational History  .      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 Smoking status: Never Smoker  . Smokeless tobacco: Never Used  . Alcohol use 0.0 oz/week     Comment: social  . Drug use: No  . Sexual activity: Yes    Partners: Male    Birth control/ protection: Condom   Other Topics Concern  . Not on file   Social History Narrative   Exercise--= daily 45 min and zumba 3 days a week   Health Maintenance  Topic Date Due  . COLONOSCOPY  03/08/2015  . INFLUENZA VACCINE  12/28/2015  . MAMMOGRAM  09/26/2016  . PAP SMEAR  12/21/2017  . TETANUS/TDAP  12/21/2024  . HIV Screening  Completed    The following portions of the patient's history were reviewed and updated as appropriate: She  has a past medical history of Anemia; Chicken pox; DUB (dysfunctional uterine bleeding); Fibroids; and Morbid obesity ([place]). She  does not have a problem list on file. She  has a past surgical history that includes Cesarean section; Endometrial ablation; laparoscopy (1990); Partial hysterectomy; and Dilation and curettage of uterus. Her family history includes Diabetes in her mother; Hyperlipidemia in her father; Hypertension in her father; Sarcoidosis in her mother. She  reports that she has never smoked. She has never used smokeless tobacco. She reports that she drinks alcohol. She reports that she does not use drugs. She has a current medication list which includes the following  prescription(s): cefdinir, cetirizine, fluconazole, fluticasone, naproxen, naproxen sodium, and triamcinolone cream. Current Outpatient Prescriptions on File Prior to Visit  Medication Sig Dispense Refill  . naproxen sodium (ANAPROX) 220 MG tablet Take 440 mg by mouth 2 (two) times daily as needed (pain).    . triamcinolone cream (KENALOG) 0.1 % Apply 1 application topically 2 (two) times daily. (Patient not taking: Reported on 05/08/2016) 30 g 0   No current facility-administered medications on file prior to visit.    She is allergic to miconazole nitrate and vagisil..  Review of Systems Review of Systems  Constitutional: Negative for activity change, appetite change and fatigue.  HENT: Negative for hearing loss, , tinnitus and ear discharge.  dentist q29m + congestion Eyes: Negative for visual disturbance (see optho q1y -- vision corrected to 20/20 with glasses).  Respiratory: Negative for, chest tightness and shortness of breath.  + productive cough Cardiovascular: Negative for chest pain, palpitations and leg swelling.  Gastrointestinal: Negative for abdominal pain, diarrhea, constipation and abdominal distention.  Genitourinary: Negative for urgency, frequency, decreased urine volume and difficulty urinating.  Musculoskeletal: Negative for back pain, and gait problem. + shoulder pain Skin: Negative for color change, pallor and rash.  Neurological: Negative for dizziness, light-headedness, numbness and headaches.  Hematological: Negative for adenopathy. Does not bruise/bleed easily.  Psychiatric/Behavioral: Negative for suicidal ideas, confusion, sleep disturbance, self-injury, dysphoric mood, decreased concentration and agitation.  Objective:    BP 123/84   Pulse 97   Temp 99.6 F (37.6 C) (Oral)   Ht 5\' 5"  (1.651 m)   LMP 07/14/2013   SpO2 97%  General appearance: alert, cooperative, appears stated age and no distress Head: Normocephalic, without obvious abnormality,  atraumatic Eyes: conjunctivae/corneas clear. PERRL, EOM's intact. Fundi benign. Ears: normal TM's and external ear canals both ears Nose: + sinus tenderness b/l ,  turb erythematous/ swollen Throat: lips, mucosa, and tongue normal; teeth and gums normal Neck: no adenopathy, no carotid bruit, no JVD, supple, symmetrical, trachea midline and thyroid not enlarged, symmetric, no tenderness/mass/nodules Back: symmetric, no curvature. ROM normal. No CVA tenderness. Lungs: clear to auscultation bilaterally Breasts: gyn Heart: regular rate and rhythm, S1, S2 normal, no murmur, click, rub or gallop Abdomen: soft, non-tender; bowel sounds normal; no masses,  no organomegaly Pelvic: deferred--gyn Extremities: pain L shoulder -- cannot abduct past 90 degrees Pulses: 2+ and symmetric Skin: Skin color, texture, turgor normal. No rashes or lesions Lymph nodes: Cervical, supraclavicular, and axillary nodes normal. Neurologic: Alert and oriented X 3, normal strength and tone. Normal symmetric reflexes. Normal coordination and gait ---- except pain and weakness L shoulder   Assessment:    Healthy female exam.     Plan:  Check labs   ghm utd See After Visit Summary for Counseling Recommendations    1. Preventative health care See above  2. Acute pain of left shoulder Sling-- she has one at home Refer to ortho - naproxen (NAPROSYN) 500 MG tablet; Take 1 tablet (500 mg total) by mouth 2 (two) times daily with a meal.  Dispense: 30 tablet; Refill: 2 - Ambulatory referral to Orthopedic Surgery  3. Acute frontal sinusitis, recurrence not specified See orders - cefdinir (OMNICEF) 300 MG capsule; Take 1 capsule (300 mg total) by mouth 2 (two) times daily.  Dispense: 20 capsule; Refill: 0 - fluticasone (FLONASE) 50 MCG/ACT nasal spray; Place 2 sprays into both nostrils daily.  Dispense: 16 g; Refill: 6 - cetirizine (ZYRTEC) 10 MG tablet; Take 1 tablet (10 mg total) by mouth daily.  Dispense: 30 tablet;  Refill: 11

## 2016-05-09 LAB — URINE CULTURE

## 2016-05-17 ENCOUNTER — Ambulatory Visit (INDEPENDENT_AMBULATORY_CARE_PROVIDER_SITE_OTHER): Payer: Self-pay

## 2016-05-17 ENCOUNTER — Ambulatory Visit (INDEPENDENT_AMBULATORY_CARE_PROVIDER_SITE_OTHER): Payer: Self-pay | Admitting: Orthopedic Surgery

## 2016-05-17 ENCOUNTER — Encounter (INDEPENDENT_AMBULATORY_CARE_PROVIDER_SITE_OTHER): Payer: Self-pay | Admitting: Orthopedic Surgery

## 2016-05-17 VITALS — BP 128/66 | HR 85 | Resp 14 | Ht 65.0 in | Wt 290.0 lb

## 2016-05-17 DIAGNOSIS — M25512 Pain in left shoulder: Secondary | ICD-10-CM

## 2016-05-17 DIAGNOSIS — M7542 Impingement syndrome of left shoulder: Secondary | ICD-10-CM

## 2016-05-17 MED ORDER — LIDOCAINE HCL 1 % IJ SOLN
2.0000 mL | INTRAMUSCULAR | Status: AC | PRN
  Administered 2016-05-17: 2 mL

## 2016-05-17 MED ORDER — BUPIVACAINE HCL 0.5 % IJ SOLN
2.0000 mL | INTRAMUSCULAR | Status: AC | PRN
  Administered 2016-05-17: 2 mL via INTRA_ARTICULAR

## 2016-05-17 MED ORDER — METHYLPREDNISOLONE ACETATE 40 MG/ML IJ SUSP
80.0000 mg | INTRAMUSCULAR | Status: AC | PRN
  Administered 2016-05-17: 80 mg

## 2016-05-17 NOTE — Progress Notes (Signed)
Office Visit Note   Patient: Jenny Cohen           Date of Birth: [birth year]           MRN: [id number] Visit Date: 05/17/2016              Requested by: Ann Held, DO [address] [place],  [id number] PCP: Ann Held, DO   Assessment & Plan: Visit Diagnoses:  1. Impingement syndrome of left shoulder   2. Acute pain of left shoulder     Plan:  #1: Subacromial injection left shoulder. Good relief except for internal rotation movement. #2: If she does not have much improvement then consider MRI scan. She will give [place] a call and we can schedule this without seeing her   Follow-Up Instructions: Return if symptoms worsen or fail to improve.   Orders:  Orders Placed This Encounter  Procedures  . Large Joint Injection/Arthrocentesis  . XR Shoulder Left   No orders of the defined types were placed in this encounter.     Procedures: Large Joint Inj Date/Time: 05/17/2016 11:56 AM Performed by: Biagio Borg D Authorized by: Biagio Borg D   Consent Given by:  Patient Timeout: prior to procedure the correct patient, procedure, and site was verified   Indications:  Pain Location:  Shoulder Site:  L subacromial bursa Prep: patient was prepped and draped in usual sterile fashion   Needle Size:  25 G Needle Length:  1.5 inches Approach:  Lateral Ultrasound Guidance: No   Fluoroscopic Guidance: No   Arthrogram: No   Medications:  80 mg methylPREDNISolone acetate 40 MG/ML; 2 mL lidocaine 1 %; 2 mL bupivacaine 0.5 % Aspiration Attempted: No   Patient tolerance:  Patient tolerated the procedure well with no immediate complications     Clinical Data: No additional findings.   Subjective: Painful left shoulder   [name] is a very pleasant 50 year old African-American female who is seen today for evaluation of her left shoulder. She states she's been having pain in left shoulder for the past 2 months however over the  past 4 weeks or so she has had increasing pain discomfort where this does bother her with any range of motion as well as nighttime sleep. She finds that the now she is unable to reach Posteriorly with her left hand because the pain and discomfort. She denies any history of injury or trauma. It is worsening to the point now where she would like to consider evaluation. She does use Naprosyn which does have some benefit.     Review of Systems  Constitutional: Negative.   HENT: Negative.   Respiratory: Negative.   Cardiovascular: Negative.        History of heart murmur  Gastrointestinal: Negative.   Genitourinary: Negative.   Skin: Negative.   Neurological: Negative.   Hematological: Negative.   Psychiatric/Behavioral: Negative.      Objective: Vital Signs: BP 128/66 (BP Location: Left Arm, Patient Position: Sitting, Cuff Size: Normal)   Pulse 85   Resp 14   Ht 5\' 5"  (1.651 m)   Wt 290 lb (131.5 kg)   LMP 07/14/2013   BMI 48.26 kg/m   Physical Exam  Constitutional: She is oriented to person, place, and time. She appears well-developed and well-nourished.  HENT:  Head: Normocephalic and atraumatic.  Eyes: EOM are normal. Pupils are equal, round, and reactive to light.  Neck:  No carotid bruits  Pulmonary/Chest: Effort normal.  Neurological: She is alert and oriented to person, place, and time.  Skin: Skin is warm and dry.  Psychiatric: She has a normal mood and affect. Her behavior is normal. Judgment and thought content normal.    Left Shoulder Exam   Tenderness  The patient is experiencing tenderness in the biceps tendon.  Range of Motion  Active Abduction: 80  Passive Abduction: 110  Forward Flexion: 170  External Rotation: 70  Internal Rotation 90 degrees: 20   Muscle Strength  Abduction: 3/5  Internal Rotation: 3/5  External Rotation: 3/5  Subscapularis: 4/5  Biceps: 4/5   Tests  Impingement: positive  Other  Sensation: normal Pulse: present    Comments:  Positive empty can test in abduction and forward flexion. Diffuse weakness in all planes tested but this is maybe a half grade different      Specialty Comments:  No specialty comments available.  Imaging: Xr Shoulder Left  Result Date: 05/17/2016 Two-view left shoulder reveals a type II acromion. She also has some distal clavicle ostial lysis and cystic changes noted.    PMFS History: There are no active problems to display for this patient.  Past Medical History:  Diagnosis Date  . Anemia   . Chicken pox   . DUB (dysfunctional uterine bleeding)   . Fibroids   . Morbid obesity ([facility])    BMI 47    Family History  Problem Relation Age of Onset  . Hyperlipidemia Father   . Hypertension Father   . Diabetes Mother   . Sarcoidosis Mother     Past Surgical History:  Procedure Laterality Date  . CESAREAN SECTION    . DILATION AND CURETTAGE OF UTERUS    . ENDOMETRIAL ABLATION    . LAPAROSCOPY  1990   with laparotomy  . PARTIAL HYSTERECTOMY     Social History   Occupational History  .      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 Smoking status: Never Smoker  . Smokeless tobacco: Never Used  . Alcohol use 0.0 oz/week     Comment: social  . Drug use: No  . Sexual activity: Yes    Partners: Male    Birth control/ protection: Condom

## 2016-06-01 ENCOUNTER — Telehealth (INDEPENDENT_AMBULATORY_CARE_PROVIDER_SITE_OTHER): Payer: Self-pay | Admitting: Orthopaedic Surgery

## 2016-06-01 NOTE — Telephone Encounter (Signed)
Patient is still having left shoulder pain and would like to get an MRI scheduled.

## 2016-06-02 ENCOUNTER — Other Ambulatory Visit (INDEPENDENT_AMBULATORY_CARE_PROVIDER_SITE_OTHER): Payer: Self-pay

## 2016-06-02 DIAGNOSIS — M25512 Pain in left shoulder: Principal | ICD-10-CM

## 2016-06-02 DIAGNOSIS — G8929 Other chronic pain: Secondary | ICD-10-CM

## 2016-06-02 NOTE — Telephone Encounter (Signed)
Sent referral per office note from [facility]

## 2016-06-02 NOTE — Telephone Encounter (Signed)
Sent referral 

## 2016-06-09 ENCOUNTER — Other Ambulatory Visit: Payer: Self-pay

## 2016-06-19 ENCOUNTER — Other Ambulatory Visit: Payer: Self-pay

## 2016-06-20 ENCOUNTER — Ambulatory Visit
Admission: RE | Admit: 2016-06-20 | Discharge: 2016-06-20 | Disposition: A | Discharge status: Home / Self Care | Payer: No Typology Code available for payment source | Source: Ambulatory Visit | Attending: Orthopaedic Surgery | Admitting: Orthopaedic Surgery

## 2016-06-20 DIAGNOSIS — M25512 Pain in left shoulder: Principal | ICD-10-CM

## 2016-06-20 DIAGNOSIS — G8929 Other chronic pain: Secondary | ICD-10-CM

## 2016-06-23 ENCOUNTER — Encounter (INDEPENDENT_AMBULATORY_CARE_PROVIDER_SITE_OTHER): Payer: Self-pay | Admitting: Orthopaedic Surgery

## 2016-06-23 ENCOUNTER — Ambulatory Visit (INDEPENDENT_AMBULATORY_CARE_PROVIDER_SITE_OTHER): Payer: Self-pay | Admitting: Orthopaedic Surgery

## 2016-06-23 VITALS — BP 104/67 | HR 85 | Ht 65.0 in | Wt 290.0 lb

## 2016-06-23 DIAGNOSIS — M25512 Pain in left shoulder: Secondary | ICD-10-CM

## 2016-06-23 DIAGNOSIS — G8929 Other chronic pain: Secondary | ICD-10-CM

## 2016-06-23 LAB — HM MAMMOGRAPHY

## 2016-06-23 NOTE — Progress Notes (Signed)
   Office Visit Note   Patient: Jenny Cohen           Date of Birth: Feb 17, 1966           MRN: [id number] Visit Date: 06/23/2016              Requested by: Ann Held, DO [address] [place], [place] [address] PCP: Ann Held, DO   Assessment & Plan: Visit Diagnoses: Left shoulder pain with impingement. MRI scan demonstrates moderate chondral thinning in the glenohumeral joint associated with partial tearing of the supraspinatus on its bursal surface. There is some mild adhesive capsulitis  Plan: Discussed MRI scan. I would like to try a course of physical therapy for range of motion and strengthening exercises. Follow up as needed. Long discussion regarding MRI scan findings and what she might expect over time. Follow-Up Instructions: No Follow-up on file.   Orders:  No orders of the defined types were placed in this encounter.  No orders of the defined types were placed in this encounter.     Procedures: No procedures performed   Clinical Data: No additional findings.   Subjective: No chief complaint on file.   Pt here to go over MRI results of Left shoulder.  Ibuprofen for pain Left shoulder injection provided no relief     Review of Systems   Objective: Vital Signs: LMP 07/14/2013   Physical Exam  Ortho Exam full overhead motion left shoulder. Mild impingement. No weakness. No crepitation. Good grip and release left hand mild tenderness in the anterior lateral subacromial region with overhead motion. Mild tenderness at the acromioclavicular joint.  Specialty Comments:  No specialty comments available.  Imaging: No results found.   PMFS History: There are no active problems to display for this patient.  Past Medical History:  Diagnosis Date  . Anemia   . Chicken pox   . DUB (dysfunctional uterine bleeding)   . Fibroids   . Morbid obesity ([facility])    BMI 47    Family History  Problem Relation Age of Onset    . Hyperlipidemia Father   . Hypertension Father   . Diabetes Mother   . Sarcoidosis Mother     Past Surgical History:  Procedure Laterality Date  . CESAREAN SECTION    . DILATION AND CURETTAGE OF UTERUS    . ENDOMETRIAL ABLATION    . LAPAROSCOPY  1990   with laparotomy  . PARTIAL HYSTERECTOMY     Social History   Occupational History  .      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 Smoking status: Never Smoker  . Smokeless tobacco: Never Used  . Alcohol use 0.0 oz/week     Comment: social  . Drug use: No  . Sexual activity: Yes    Partners: Male    Birth control/ protection: Condom

## 2016-06-27 ENCOUNTER — Ambulatory Visit (AMBULATORY_SURGERY_CENTER): Payer: Self-pay | Admitting: *Deleted

## 2016-06-27 VITALS — Ht 65.0 in | Wt 294.6 lb

## 2016-06-27 DIAGNOSIS — Z1211 Encounter for screening for malignant neoplasm of colon: Secondary | ICD-10-CM

## 2016-06-27 MED ORDER — NA SULFATE-K SULFATE-MG SULF 17.5-3.13-1.6 GM/177ML PO SOLN: ORAL | 0 refills | Status: DC

## 2016-06-27 NOTE — Progress Notes (Signed)
No egg or soy allergy  Pt's BMI- 49.02  No diet medications taken  No hx of sleep apnea or home oxygen used  Correction on prep instructions made-  Pt told to start second dose of prep at 400 of procedure day  No anesthesia or intubation problems

## 2016-07-11 ENCOUNTER — Telehealth: Payer: Self-pay | Admitting: Gastroenterology

## 2016-07-11 ENCOUNTER — Encounter: Payer: Self-pay | Admitting: Gastroenterology

## 2016-07-11 NOTE — Telephone Encounter (Signed)
Please charge procedure no-show fee.

## 2016-08-10 ENCOUNTER — Encounter: Payer: Self-pay | Admitting: Gastroenterology

## 2016-08-10 ENCOUNTER — Ambulatory Visit (AMBULATORY_SURGERY_CENTER): Payer: Self-pay | Admitting: Gastroenterology

## 2016-08-10 VITALS — BP 122/66 | HR 85 | Temp 98.4°F | Resp 17 | Ht 65.0 in | Wt 294.0 lb

## 2016-08-10 DIAGNOSIS — Z1211 Encounter for screening for malignant neoplasm of colon: Secondary | ICD-10-CM

## 2016-08-10 DIAGNOSIS — D125 Benign neoplasm of sigmoid colon: Secondary | ICD-10-CM

## 2016-08-10 DIAGNOSIS — Z1212 Encounter for screening for malignant neoplasm of rectum: Secondary | ICD-10-CM

## 2016-08-10 DIAGNOSIS — D127 Benign neoplasm of rectosigmoid junction: Secondary | ICD-10-CM

## 2016-08-10 MED ORDER — SODIUM CHLORIDE 0.9 % IV SOLN: 500.0000 mL | INTRAVENOUS | Status: DC

## 2016-08-10 NOTE — Progress Notes (Signed)
Called to room to assist during endoscopic procedure.  Patient ID and intended procedure confirmed with present staff. Received instructions for my participation in the procedure from the performing physician.  

## 2016-08-10 NOTE — Op Note (Signed)
[place] Patient Name: Jenny Cohen Procedure Date: 08/10/2016 2:14 PM MRN: [id number] Endoscopist: Mallie Mussel L. Loletha Carrow , MD Age: 51 Referring MD:  Date of Birth: 1966-02-28 Gender: Female Account #: [id number] Procedure:                Colonoscopy Indications:              Screening for colorectal malignant neoplasm, This                            is the patient's first colonoscopy Medicines:                Monitored Anesthesia Care Procedure:                Pre-Anesthesia Assessment:                           - Prior to the procedure, a History and Physical                            was performed, and patient medications and                            allergies were reviewed. The patient's tolerance of                            previous anesthesia was also reviewed. The risks                            and benefits of the procedure and the sedation                            options and risks were discussed with the patient.                            All questions were answered, and informed consent                            was obtained. Prior Anticoagulants: The patient has                            taken no previous anticoagulant or antiplatelet                            agents. ASA Grade Assessment: III - A patient with                            severe systemic disease. After reviewing the risks                            and benefits, the patient was deemed in                            satisfactory condition to undergo the procedure.  After obtaining informed consent, the colonoscope                            was passed under direct vision. Throughout the                            procedure, the patient's blood pressure, pulse, and                            oxygen saturations were monitored continuously. The                            Colonoscope was introduced through the anus and                            advanced to the the  terminal ileum. The colonoscopy                            was performed with difficulty due to significant                            looping. Successful completion of the procedure was                            aided by using manual pressure. The quality of the                            bowel preparation was FAIR. The terminal ileum,                            ileocecal valve, appendiceal orifice, and rectum                            were photographed. The bowel preparation used was                            SUPREP. Scope In: 2:24:54 PM Scope Out: 2:56:48 PM Scope Withdrawal Time: 0 hours 29 minutes 7 seconds  Total Procedure Duration: 0 hours 31 minutes 54 seconds  Findings:                 The perianal and digital rectal examinations were                            normal.                           A 15 mm polyp was found in the recto-sigmoid colon.                            The polyp was pedunculated. The polyp was removed                            with a hot snare. Resection and retrieval were  complete.                           The exam was otherwise without abnormality on                            direct and retroflexion views (given the limitation                            of the prep). Complications:            No immediate complications. Estimated Blood Loss:     Estimated blood loss: none. Impression:               - Preparation of the colon was fair.                           - One 15 mm polyp at the recto-sigmoid colon,                            removed with a hot snare. Resected and retrieved.                           - The examination was otherwise normal on direct                            and retroflexion views. Recommendation:           - Patient has a contact number available for                            emergencies. The signs and symptoms of potential                            delayed complications were discussed with the                             patient. Return to normal activities tomorrow.                            Written discharge instructions were provided to the                            patient.                           - Resume previous diet.                           - Continue present medications.                           - Await pathology results.                           - Repeat colonoscopy is recommended for  surveillance. The colonoscopy date will be                            determined after pathology results from today's                            exam become available for review. Most likely                            recall within 1 year due to large polyp and fair                            preparation throughout colon. Doreen Garretson L. Loletha Carrow, MD 08/10/2016 3:01:17 PM This report has been signed electronically.

## 2016-08-10 NOTE — Progress Notes (Signed)
To recovery, report to [place], RN, VSS 

## 2016-08-10 NOTE — Patient Instructions (Signed)
YOU HAD AN ENDOSCOPIC PROCEDURE TODAY AT [facility]:   Refer to the procedure report that was given to you for any specific questions about what was found during the examination.  If the procedure report does not answer your questions, please call your gastroenterologist to clarify.  If you requested that your care partner not be given the details of your procedure findings, then the procedure report has been included in a sealed envelope for you to review at your convenience later.  YOU SHOULD EXPECT: Some feelings of bloating in the abdomen. Passage of more gas than usual.  Walking can help get rid of the air that was put into your GI tract during the procedure and reduce the bloating. If you had a lower endoscopy (such as a colonoscopy or flexible sigmoidoscopy) you may notice spotting of blood in your stool or on the toilet paper. If you underwent a bowel prep for your procedure, you may not have a normal bowel movement for a few days.  Please Note:  You might notice some irritation and congestion in your nose or some drainage.  This is from the oxygen used during your procedure.  There is no need for concern and it should clear up in a day or so.  SYMPTOMS TO REPORT IMMEDIATELY:   Following lower endoscopy (colonoscopy or flexible sigmoidoscopy):  Excessive amounts of blood in the stool  Significant tenderness or worsening of abdominal pains  Swelling of the abdomen that is new, acute  Fever of 100F or higher  For urgent or emergent issues, a gastroenterologist can be reached at any hour by calling [phone].   DIET:  We do recommend a small meal at first, but then you may proceed to your regular diet.  Drink plenty of fluids but you should avoid alcoholic beverages for 24 hours.  ACTIVITY:  You should plan to take it easy for the rest of today and you should NOT DRIVE or use heavy machinery until tomorrow (because of the sedation medicines used during the test).     FOLLOW UP: Our staff will call the number listed on your records the next business day following your procedure to check on you and address any questions or concerns that you may have regarding the information given to you following your procedure. If we do not reach you, we will leave a message.  However, if you are feeling well and you are not experiencing any problems, there is no need to return our call.  We will assume that you have returned to your regular daily activities without incident.  If any biopsies were taken you will be contacted by phone or by letter within the next 1-3 weeks.  Please call us at [phone] if you have not heard about the biopsies in 3 weeks.   SIGNATURES/CONFIDENTIALITY: You and/or your care partner have signed paperwork which will be entered into your electronic medical record.  These signatures attest to the fact that that the information above on your After Visit Summary has been reviewed and is understood.  Full responsibility of the confidentiality of this discharge information lies with you and/or your care-partner.  Await pathology- next colonoscopy in 1 year  Please read over handout about polyps  Continue your normal medications

## 2016-08-11 ENCOUNTER — Telehealth: Payer: Self-pay | Admitting: *Deleted

## 2016-08-11 NOTE — Telephone Encounter (Signed)
  Follow up Call-  Call back number 08/10/2016  Post procedure Call Back phone  # [phone]  Permission to leave phone message Yes  Some recent data might be hidden    [facility]

## 2016-08-14 ENCOUNTER — Telehealth: Payer: Self-pay | Admitting: *Deleted

## 2016-08-14 NOTE — Telephone Encounter (Signed)
  Follow up Call-  Call back number 08/10/2016  Post procedure Call Back phone  # [phone]  Permission to leave phone message Yes  Some recent data might be hidden     Patient questions:  Do you have a fever, pain , or abdominal swelling? No. Pain Score  0 *  Have you tolerated food without any problems? Yes.    Have you been able to return to your normal activities? Yes.    Do you have any questions about your discharge instructions: Diet   No. Medications  No. Follow up visit  No.  Do you have questions or concerns about your Care? No.  Actions: * If pain score is 4 or above: No action needed, pain <4.

## 2016-08-17 ENCOUNTER — Encounter: Payer: Self-pay | Admitting: Gastroenterology

## 2016-08-24 ENCOUNTER — Telehealth: Payer: Self-pay | Admitting: Gastroenterology

## 2016-08-24 NOTE — Telephone Encounter (Signed)
Left message for patient, sorry I missed her. Returning call re: abdominal pain. Let her know that there is a doctor on call 24 hrs a day and if she needed to contact them, asked her to call back the main number.

## 2016-10-16 ENCOUNTER — Encounter: Payer: Self-pay | Admitting: Family Medicine

## 2016-10-16 ENCOUNTER — Ambulatory Visit (INDEPENDENT_AMBULATORY_CARE_PROVIDER_SITE_OTHER): Payer: Self-pay | Admitting: Family Medicine

## 2016-10-16 VITALS — BP 123/72 | HR 79 | Temp 98.4°F | Wt 297.0 lb

## 2016-10-16 DIAGNOSIS — R3915 Urgency of urination: Secondary | ICD-10-CM

## 2016-10-16 DIAGNOSIS — R309 Painful micturition, unspecified: Secondary | ICD-10-CM

## 2016-10-16 LAB — URINALYSIS
Bilirubin Urine: NEGATIVE
HGB URINE DIPSTICK: NEGATIVE
KETONES UR: NEGATIVE
LEUKOCYTES UA: NEGATIVE
Nitrite: NEGATIVE
SPECIFIC GRAVITY, URINE: 1.015 (ref 1.000–1.030)
Total Protein, Urine: NEGATIVE
UROBILINOGEN UA: 0.2 (ref 0.0–1.0)
Urine Glucose: NEGATIVE
pH: 7 (ref 5.0–8.0)

## 2016-10-16 LAB — POCT URINALYSIS DIPSTICK
BILIRUBIN UA: NEGATIVE
Blood, UA: NEGATIVE
GLUCOSE UA: NEGATIVE
Ketones, UA: NEGATIVE
LEUKOCYTES UA: NEGATIVE
NITRITE UA: NEGATIVE
PH UA: 6 (ref 5.0–8.0)
Protein, UA: NEGATIVE
Spec Grav, UA: 1.02 (ref 1.010–1.025)
Urobilinogen, UA: NEGATIVE E.U./dL — AB

## 2016-10-16 MED ORDER — CIPROFLOXACIN HCL 250 MG PO TABS: 250.0000 mg | ORAL_TABLET | Freq: Two times a day (BID) | ORAL | 0 refills | Status: DC

## 2016-10-16 NOTE — Patient Instructions (Signed)

## 2016-10-16 NOTE — Progress Notes (Signed)
Patient ID: Jenny Cohen, female   DOB: 1965/07/13, 51 y.o.   MRN: [id number]   Subjective:  I acted as a Education administrator for [employer], DO. Raiford Noble, [place]   Patient ID: Jenny Cohen, female    DOB: 02-17-1966, 51 y.o.   MRN: [id number]  Chief Complaint  Patient presents with  . Urinary Tract Infection    Urinary Tract Infection   This is a new problem. The current episode started 1 to 4 weeks ago. The problem has been unchanged. The quality of the pain is described as aching. The pain is mild. There has been no fever. Associated symptoms include urgency. Pertinent negatives include no vomiting. She has tried acetaminophen for the symptoms. The treatment provided no relief.    Patient is in today for an acute visit for a possible urinary tract infection. Patient states for the past 10 days she has been experiencing urinary urgency and pain with urination. Patient has no previous Hx listed. Patient has no additional acute concerns noted at this time.  Patient Care Team: Carollee Herter, Alferd Apa, DO as PCP - General (Family Medicine) Camillia Herter, MD as Referring Physician (Obstetrics and Gynecology)   Past Medical History:  Diagnosis Date  . Allergy   . Anemia   . Arthritis    LEFT SHOULDER  . Chicken pox   . DUB (dysfunctional uterine bleeding)   . Fibroids   . Heart murmur    as a child  . Hyperlipidemia   . Morbid obesity ([place])    BMI 47    Past Surgical History:  Procedure Laterality Date  . CESAREAN SECTION    . DILATION AND CURETTAGE OF UTERUS    . ENDOMETRIAL ABLATION    . LAPAROSCOPY  1990   with laparotomy  . PARTIAL HYSTERECTOMY      Family History  Problem Relation Age of Onset  . Hyperlipidemia Father   . Hypertension Father   . Diabetes Mother   . Sarcoidosis Mother   . Pancreatic cancer Maternal Uncle   . Colon cancer Maternal Grandfather   . Esophageal cancer Neg Hx   . Rectal cancer Neg Hx   . Stomach cancer Neg Hx     Social  History   Social History  . Marital status: Divorced    Spouse name: N/A  . Number of children: N/A  . Years of education: N/A   Occupational History  .      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 Smoking status: Never Smoker  . Smokeless tobacco: Never Used  . Alcohol use 0.0 oz/week     Comment: social  . Drug use: No  . Sexual activity: Yes    Partners: Male    Birth control/ protection: Condom   Other Topics Concern  . Not on file   Social History Narrative   Exercise--= daily 45 min and zumba 3 days a week    Outpatient Medications Prior to Visit  Medication Sig Dispense Refill  . cetirizine (ZYRTEC) 10 MG tablet Take 1 tablet (10 mg total) by mouth daily. 30 tablet 11  . fluticasone (FLONASE) 50 MCG/ACT nasal spray Place 2 sprays into both nostrils daily. 16 g 6  . Multiple Vitamin (MULTIVITAMIN) tablet Take 1 tablet by mouth daily.    . naproxen (NAPROSYN) 500 MG tablet Take 1 tablet (500 mg total) by mouth 2 (two) times daily with a meal. 30 tablet 2  . naproxen  sodium (ANAPROX) 220 MG tablet Take 440 mg by mouth 2 (two) times daily as needed (pain).    . triamcinolone cream (KENALOG) 0.1 % Apply 1 application topically 2 (two) times daily. (Patient not taking: Reported on 10/16/2016) 30 g 0   Facility-Administered Medications Prior to Visit  Medication Dose Route Frequency Provider Last Rate Last Dose  . 0.9 %  sodium chloride infusion  500 mL Intravenous Continuous Doran Stabler, MD        Allergies  Allergen Reactions  . Miconazole Nitrate Itching  . Vagisil Itching    Review of Systems  Constitutional: Negative for fever and malaise/fatigue.  HENT: Negative for congestion.   Eyes: Negative for blurred vision.  Respiratory: Negative for cough and shortness of breath.   Cardiovascular: Negative for chest pain, palpitations and leg swelling.  Gastrointestinal: Negative for vomiting.  Genitourinary: Positive for dysuria  and urgency.  Musculoskeletal: Negative for back pain.  Skin: Negative for rash.  Neurological: Negative for loss of consciousness and headaches.       Objective:    Physical Exam  Constitutional: She is oriented to person, place, and time. She appears well-developed and well-nourished. No distress.  HENT:  Head: Normocephalic and atraumatic.  Eyes: Conjunctivae are normal.  Neck: Normal range of motion. No thyromegaly present.  Cardiovascular: Normal rate and regular rhythm.   Pulmonary/Chest: Effort normal and breath sounds normal. She has no wheezes.  Abdominal: Soft. Bowel sounds are normal. There is no tenderness.  Musculoskeletal: She exhibits no edema or deformity.  Neurological: She is alert and oriented to person, place, and time.  Skin: Skin is warm and dry. She is not diaphoretic.  Psychiatric: She has a normal mood and affect.    BP 123/72 (BP Location: Right Wrist, Patient Position: Sitting, Cuff Size: Normal)   Pulse 79   Temp 98.4 F (36.9 C) (Oral)   Wt 297 lb (134.7 kg)   LMP 07/14/2013   SpO2 98% Comment: RA  BMI 49.42 kg/m  Wt Readings from Last 3 Encounters:  10/16/16 297 lb (134.7 kg)  08/10/16 294 lb (133.4 kg)  06/27/16 294 lb 9.6 oz (133.6 kg)   BP Readings from Last 3 Encounters:  10/16/16 123/72  08/10/16 122/66  06/23/16 104/67     Immunization History  Administered Date(s) Administered  . Tdap 12/22/2014    Health Maintenance  Topic Date Due  . MAMMOGRAM  09/26/2016  . INFLUENZA VACCINE  12/27/2016  . PAP SMEAR  12/21/2017  . COLONOSCOPY  08/11/2019  . TETANUS/TDAP  12/21/2024  . HIV Screening  Completed    Lab Results  Component Value Date   WBC 9.8 05/08/2016   HGB 13.6 05/08/2016   HCT 41.3 05/08/2016   PLT 432.0 (H) 05/08/2016   GLUCOSE 98 05/08/2016   CHOL 199 05/08/2016   TRIG 81.0 05/08/2016   HDL 52.00 05/08/2016   LDLCALC 130 (H) 05/08/2016   ALT 9 05/08/2016   AST 9 05/08/2016   NA 132 (L) 05/08/2016   K 3.5  05/08/2016   CL 100 05/08/2016   CREATININE 0.70 05/08/2016   BUN 8 05/08/2016   CO2 26 05/08/2016   TSH 2.48 05/08/2016   HGBA1C 5.8 (H) 07/30/2013    Lab Results  Component Value Date   TSH 2.48 05/08/2016   Lab Results  Component Value Date   WBC 9.8 05/08/2016   HGB 13.6 05/08/2016   HCT 41.3 05/08/2016   MCV 83.5 05/08/2016   PLT 432.0 (  H) 05/08/2016   Lab Results  Component Value Date   NA 132 (L) 05/08/2016   K 3.5 05/08/2016   CO2 26 05/08/2016   GLUCOSE 98 05/08/2016   BUN 8 05/08/2016   CREATININE 0.70 05/08/2016   BILITOT 0.7 05/08/2016   ALKPHOS 95 05/08/2016   AST 9 05/08/2016   ALT 9 05/08/2016   PROT 7.7 05/08/2016   ALBUMIN 3.8 05/08/2016   CALCIUM 9.1 05/08/2016   ANIONGAP 6 04/23/2016   GFR 113.38 05/08/2016   Lab Results  Component Value Date   CHOL 199 05/08/2016   Lab Results  Component Value Date   HDL 52.00 05/08/2016   Lab Results  Component Value Date   LDLCALC 130 (H) 05/08/2016   Lab Results  Component Value Date   TRIG 81.0 05/08/2016   Lab Results  Component Value Date   CHOLHDL 4 05/08/2016   Lab Results  Component Value Date   HGBA1C 5.8 (H) 07/30/2013         Assessment & Plan:   Problem List Items Addressed This Visit    None    Visit Diagnoses    Urinary pain    -  Primary   Relevant Medications   ciprofloxacin (CIPRO) 250 MG tablet   Other Relevant Orders   POCT Urinalysis Dipstick (Completed)   Urinalysis (Completed)   Urine Culture   Urinary urgency       Relevant Orders   POCT Urinalysis Dipstick (Completed)   Urinalysis (Completed)   Urine Culture      I have discontinued Ms. Whittley's triamcinolone cream and naproxen sodium. I am also having her start on ciprofloxacin. Additionally, I am having her maintain her fluticasone, cetirizine, naproxen, and multivitamin. We will continue to administer sodium chloride.  Meds ordered this encounter  Medications  . ciprofloxacin (CIPRO) 250 MG  tablet    Sig: Take 1 tablet (250 mg total) by mouth 2 (two) times daily.    Dispense:  6 tablet    Refill:  0    CMA served as scribe during this visit. History, Physical and Plan performed by medical provider. Documentation and orders reviewed and attested to.  Ann Held, DO

## 2016-10-16 NOTE — Progress Notes (Signed)
Pre visit review using our clinic review tool, if applicable. No additional management support is needed unless otherwise documented below in the visit note. 

## 2016-10-17 LAB — URINE CULTURE: ORGANISM ID, BACTERIA: NO GROWTH

## 2017-02-12 ENCOUNTER — Other Ambulatory Visit: Payer: Self-pay | Admitting: Family Medicine

## 2017-02-12 DIAGNOSIS — R309 Painful micturition, unspecified: Secondary | ICD-10-CM

## 2017-02-12 NOTE — Telephone Encounter (Signed)
Pt agreed to an appt, appt was made for tomorrow. She had no additional questions at this time. Nothing further is needed

## 2017-02-12 NOTE — Telephone Encounter (Signed)
Per provider pt needs to be seen for this req/plz see prev phone call notes/thx dmf

## 2017-02-12 NOTE — Telephone Encounter (Signed)
We would need to see her too

## 2017-02-12 NOTE — Telephone Encounter (Signed)
Pt requesting Cipro. See below.   ciprofloxacin (CIPRO) 250 MG tablet [Yvonne R Carollee Herter, DO]   Patient Comment: I have a toothache that now has my face and ear hurting. I cannot see my dentist until 9/24 and he will not call anything in for pain or the infection without seeing me. I would like to get this refilled to assist with infection.     Preferred pharmacy: [id number] - [place], [address] AT [address]   Please advise.

## 2017-02-13 ENCOUNTER — Ambulatory Visit (INDEPENDENT_AMBULATORY_CARE_PROVIDER_SITE_OTHER): Payer: Self-pay | Admitting: Family Medicine

## 2017-02-13 ENCOUNTER — Encounter: Payer: Self-pay | Admitting: Family Medicine

## 2017-02-13 VITALS — BP 118/72 | HR 77 | Temp 98.4°F | Ht 65.0 in | Wt 289.6 lb

## 2017-02-13 DIAGNOSIS — M25512 Pain in left shoulder: Secondary | ICD-10-CM

## 2017-02-13 DIAGNOSIS — F419 Anxiety disorder, unspecified: Secondary | ICD-10-CM | POA: Insufficient documentation

## 2017-02-13 DIAGNOSIS — K047 Periapical abscess without sinus: Secondary | ICD-10-CM

## 2017-02-13 MED ORDER — ALPRAZOLAM 0.25 MG PO TABS: 0.2500 mg | ORAL_TABLET | Freq: Three times a day (TID) | ORAL | 0 refills | Status: DC | PRN

## 2017-02-13 MED ORDER — NAPROXEN 500 MG PO TABS: 500.0000 mg | ORAL_TABLET | Freq: Two times a day (BID) | ORAL | 2 refills | Status: DC

## 2017-02-13 MED ORDER — AMOXICILLIN-POT CLAVULANATE 875-125 MG PO TABS: 1.0000 | ORAL_TABLET | Freq: Two times a day (BID) | ORAL | 0 refills | Status: DC

## 2017-02-13 NOTE — Assessment & Plan Note (Signed)
Grief reaction Xanax 1/2-1 po tid prn  F/u prn

## 2017-02-13 NOTE — Assessment & Plan Note (Signed)
F/u dentist next week

## 2017-02-13 NOTE — Patient Instructions (Signed)
Tooth Injuries Tooth injuries (tooth trauma) include cracked or broken teeth (fractures), teeth that have been moved out of place or dislodged (luxations), and knocked-out teeth (avulsions). A tooth injury often needs to be treated quickly to save the tooth. However, sometimes it is not possible to save a tooth after an injury, so the tooth may need to be removed (extracted). What are the causes? Tooth injuries may be caused by any force that is strong enough to chip, break, dislodge, or knock out a tooth. Forces may be due to:  Sports injuries.  Falls.  Accidents.  Fights.  What increases the risk? You may be more likely to injure a tooth if you play a contact sport without using a mouthguard. What are the signs or symptoms? A tooth that is forced into the gum may appear dislodged or moved out of position into the tooth socket. A fractured tooth may not be as obvious. Symptoms of a tooth injury include:  Pain, especially with chewing.  A loose tooth.  Bleeding in or around the tooth.  Swelling or bruising near the tooth.  Swelling or bruising of the lip over the injured tooth.  Increased sensitivity to heat and cold.  How is this diagnosed? A tooth injury can be diagnosed with a medical history and a physical exam. You may also need dental X-rays to check for injuries to the root of the tooth. How is this treated? Treatment depends on the type of injury you have and how bad it is. Treatment may need to be done quickly to save your tooth. Possible treatments include:  Replacing a tooth fragment with a filling, cap, or hard, protective cover (crown). This may be an option for a chip or fracture that does not involve the inside of your tooth (pulp).  Having a procedure to repair the inside of the tooth (root canal) and then having a crown placed on top. This may be done to treat a tooth fracture that involves the pulp.  Repositioning a dislodged tooth, then doing a root canal.  The root canal usually needs to be done within a few days of the injury.  Replacing a knocked-out tooth in the socket, if possible, then doing a root canal a few weeks later.  Tooth extraction for a fracture that extends below your gumline or splits your tooth completely.  Follow these instructions at home:  Take medicines only as directed by your dental provider or health care provider.  Keep all follow-up visits as directed by your dental provider or health care provider. This is important.  Do not eat or chew on very hard objects. These include ice cubes, pens, pencils, hard candy, and popcorn kernels.  Do not clench or grind your teeth. Tell your dental provider or health care provider if you grind your teeth while you sleep.  Apply ice to your mouth near the injured tooth as directed by your dental provider or health care provider.  Follow instructions about rinsing your mouth with salt water as directed by your dental provider or health care provider.  Do not use your teeth to open packages.  Always wear mouth protection when you play contact sports. Contact a health care provider if:  You continue to have tooth pain after a tooth injury.  Your tooth is sensitive to heat and cold.  You develop swelling near your injured tooth.  You have a fever.  You are unable to open your jaw.  You are drooling and it is getting worse.  This information is not intended to replace advice given to you by your health care provider. Make sure you discuss any questions you have with your health care provider. Document Released: 02/10/2004 Document Revised: 10/21/2015 Document Reviewed: 05/11/2014 Elsevier Interactive Patient Education  [employer].

## 2017-02-13 NOTE — Progress Notes (Signed)
Patient ID: Jenny Cohen, female    DOB: [birth year]  Age: 51 y.o. MRN: [id number]   . Subjective:  Subjective  HPI Jenny Cohen presents for cracked tooth.  Her appointment with her dentist is next Monday.  She is having pain and needs abx.   She is also very upset because her boyfriend just dropped dead from a heart attack.  She is not sleeping  Review of Systems  Constitutional: Negative for activity change, appetite change and unexpected weight change.  HENT: Positive for dental problem. Negative for congestion.   Respiratory: Negative for cough and shortness of breath.   Cardiovascular: Negative for chest pain and palpitations.  Psychiatric/Behavioral: Negative for behavioral problems and dysphoric mood. The patient is not nervous/anxious.     History Past Medical History:  Diagnosis Date  . Allergy   . Anemia   . Arthritis    LEFT SHOULDER  . Chicken pox   . DUB (dysfunctional uterine bleeding)   . Fibroids   . Heart murmur    as a child  . Hyperlipidemia   . Morbid obesity ([place])    BMI 47    She has a past surgical history that includes Cesarean section; Endometrial ablation; laparoscopy (1990); Partial hysterectomy; and Dilation and curettage of uterus.   Her family history includes Colon cancer in her maternal grandfather; Diabetes in her mother; Hyperlipidemia in her father; Hypertension in her father; Pancreatic cancer in her maternal uncle; Sarcoidosis in her mother.She reports that she has never smoked. She has never used smokeless tobacco. She reports that she drinks alcohol. She reports that she does not use drugs.  Current Outpatient Prescriptions on File Prior to Visit  Medication Sig Dispense Refill  . cetirizine (ZYRTEC) 10 MG tablet Take 1 tablet (10 mg total) by mouth daily. (Patient not taking: Reported on 02/13/2017) 30 tablet 11  . fluticasone (FLONASE) 50 MCG/ACT nasal spray Place 2 sprays into both nostrils daily. (Patient not taking: Reported  on 02/13/2017) 16 g 6  . Multiple Vitamin (MULTIVITAMIN) tablet Take 1 tablet by mouth daily.     Current Facility-Administered Medications on File Prior to Visit  Medication Dose Route Frequency Provider Last Rate Last Dose  . 0.9 %  sodium chloride infusion  500 mL Intravenous Continuous Danis, Kirke Corin, MD         Objective:  Objective  Physical Exam  Constitutional: She is oriented to person, place, and time. She appears well-developed and well-nourished.  HENT:  Head: Normocephalic and atraumatic.  Mouth/Throat: Abnormal dentition.    Eyes: Conjunctivae and EOM are normal.  Neck: Normal range of motion. Neck supple. No JVD present. Carotid bruit is not present. No thyromegaly present.  Cardiovascular: Normal rate, regular rhythm and normal heart sounds.   No murmur heard. Pulmonary/Chest: Effort normal and breath sounds normal. No respiratory distress. She has no wheezes. She has no rales. She exhibits no tenderness.  Musculoskeletal: She exhibits no edema.  Neurological: She is alert and oriented to person, place, and time.  Psychiatric: She has a normal mood and affect.  Nursing note and vitals reviewed.  BP 118/72 (BP Location: Right Arm, Patient Position: Sitting, Cuff Size: Large)   Pulse 77   Temp 98.4 F (36.9 C) (Oral)   Ht 5\' 5"  (1.651 m)   Wt 289 lb 9.6 oz (131.4 kg)   LMP 07/14/2013   SpO2 97%   BMI 48.19 kg/m  Wt Readings from Last 3 Encounters:  02/13/17 289 lb  9.6 oz (131.4 kg)  10/16/16 297 lb (134.7 kg)  08/10/16 294 lb (133.4 kg)     Lab Results  Component Value Date   WBC 9.8 05/08/2016   HGB 13.6 05/08/2016   HCT 41.3 05/08/2016   PLT 432.0 (H) 05/08/2016   GLUCOSE 98 05/08/2016   CHOL 199 05/08/2016   TRIG 81.0 05/08/2016   HDL 52.00 05/08/2016   LDLCALC 130 (H) 05/08/2016   ALT 9 05/08/2016   AST 9 05/08/2016   NA 132 (L) 05/08/2016   K 3.5 05/08/2016   CL 100 05/08/2016   CREATININE 0.70 05/08/2016   BUN 8 05/08/2016   CO2 26  05/08/2016   TSH 2.48 05/08/2016   HGBA1C 5.8 (H) 07/30/2013    Mr Shoulder Left W/o Contrast  Result Date: 06/21/2016 CLINICAL DATA:  Left shoulder pain for 4 months after weight lifting. EXAM: MRI OF THE LEFT SHOULDER WITHOUT CONTRAST TECHNIQUE: Multiplanar, multisequence MR imaging of the shoulder was performed. No intravenous contrast was administered. COMPARISON:  06/07/2014 chest radiograph FINDINGS: Rotator cuff: Partial thickness bursal surface tearing of the supraspinatus tendon with moderate supraspinatus tendinopathy but no definite full-thickness extension. Intrasubstance distal fissuring of the supraspinatus tendon, image 8/101. Mild infraspinatus and subscapularis tendinopathy. Muscles:  Unremarkable Biceps long head:  Unremarkable Acromioclavicular Joint: Mild spurring and mild moderate edema within the joint with mild subcortical marrow edema along the joint. Type III acromion. Small but abnormal amount of fluid in the subacromial subdeltoid bursa. Glenohumeral Joint: Accentuated T2 signal in the posterior band of the inferior glenohumeral ligament near its glenoid attachment, image 13/101. Mild thickening of the IGL. Moderate degenerative glenohumeral chondral thinning. Labrum: There is some mild irregularity and accentuated signal along the posterior labrum, representing at least labral degeneration. A well-defined labral tear extending to the articular surface is not seen. Bones: No significant extra-articular osseous abnormalities identified. Other: No supplemental non-categorized findings. IMPRESSION: 1. Moderate supraspinatus tendinopathy with a partial thickness bursal surface tear, and a separate area fissuring distally in the supraspinatus tendon. No full-thickness tear identified. Mild infraspinatus and subscapularis tendinopathy. 2. Thickening and mildly accentuated T2 signal in the inferior glenohumeral ligament could represent an IGL sprain or low-grade synovitis along the IGL. 3.  Moderate degenerative glenohumeral and AC joint arthropathy. Unfavorable subacromial morphology. 4. Mild subacromial subdeltoid bursitis. 5. Degeneration of the posterior labrum without a well-defined tear. Electronically Signed   By: Van Clines M.D.   On: 06/21/2016 08:58     Assessment & Plan:  Plan  I have discontinued Ms. Lozinski's ciprofloxacin. I am also having her start on amoxicillin-clavulanate and ALPRAZolam. Additionally, I am having her maintain her fluticasone, cetirizine, multivitamin, and naproxen. We will continue to administer sodium chloride.  Meds ordered this encounter  Medications  . amoxicillin-clavulanate (AUGMENTIN) 875-125 MG tablet    Sig: Take 1 tablet by mouth 2 (two) times daily.    Dispense:  20 tablet    Refill:  0  . naproxen (NAPROSYN) 500 MG tablet    Sig: Take 1 tablet (500 mg total) by mouth 2 (two) times daily with a meal.    Dispense:  30 tablet    Refill:  2  . ALPRAZolam (XANAX) 0.25 MG tablet    Sig: Take 1 tablet (0.25 mg total) by mouth 3 (three) times daily as needed for anxiety.    Dispense:  20 tablet    Refill:  0    Problem List Items Addressed This Visit      Unprioritized  Anxiety    Grief reaction Xanax 1/2-1 po tid prn  F/u prn       Relevant Medications   ALPRAZolam (XANAX) 0.25 MG tablet   Tooth infection - Primary    F/u dentist next week      Relevant Medications   amoxicillin-clavulanate (AUGMENTIN) 875-125 MG tablet    Other Visit Diagnoses    Acute pain of left shoulder       Relevant Medications   naproxen (NAPROSYN) 500 MG tablet      Follow-up: Return if symptoms worsen or fail to improve.  Ann Held, DO

## 2017-05-17 ENCOUNTER — Encounter: Payer: Self-pay | Admitting: Family Medicine

## 2017-06-13 ENCOUNTER — Encounter: Payer: Self-pay | Admitting: *Deleted

## 2017-07-04 ENCOUNTER — Ambulatory Visit: Payer: Self-pay | Admitting: Obstetrics & Gynecology

## 2017-07-04 DIAGNOSIS — Z01419 Encounter for gynecological examination (general) (routine) without abnormal findings: Secondary | ICD-10-CM

## 2017-07-09 ENCOUNTER — Ambulatory Visit (INDEPENDENT_AMBULATORY_CARE_PROVIDER_SITE_OTHER): Payer: Self-pay | Admitting: Family Medicine

## 2017-07-09 ENCOUNTER — Encounter: Payer: Self-pay | Admitting: Family Medicine

## 2017-07-09 VITALS — BP 120/68 | HR 70 | Temp 98.6°F | Resp 16 | Ht 66.0 in | Wt 285.8 lb

## 2017-07-09 DIAGNOSIS — F419 Anxiety disorder, unspecified: Secondary | ICD-10-CM

## 2017-07-09 DIAGNOSIS — Z0000 Encounter for general adult medical examination without abnormal findings: Secondary | ICD-10-CM

## 2017-07-09 LAB — COMPREHENSIVE METABOLIC PANEL
ALT: 13 U/L (ref 0–35)
AST: 12 U/L (ref 0–37)
Albumin: 3.6 g/dL (ref 3.5–5.2)
Alkaline Phosphatase: 105 U/L (ref 39–117)
BILIRUBIN TOTAL: 0.7 mg/dL (ref 0.2–1.2)
BUN: 10 mg/dL (ref 6–23)
CALCIUM: 8.8 mg/dL (ref 8.4–10.5)
CO2: 28 meq/L (ref 19–32)
CREATININE: 0.7 mg/dL (ref 0.40–1.20)
Chloride: 103 mEq/L (ref 96–112)
GFR: 112.86 mL/min (ref >60.00)
GLUCOSE: 92 mg/dL (ref 70–99)
Potassium: 3.5 mEq/L (ref 3.5–5.1)
Sodium: 139 mEq/L (ref 135–145)
Total Protein: 7.3 g/dL (ref 6.0–8.3)

## 2017-07-09 LAB — CBC WITH DIFFERENTIAL/PLATELET
BASOS ABS: 0.1 10*3/uL (ref 0.0–0.1)
Basophils Relative: 1.2 % (ref 0.0–3.0)
EOS ABS: 0.1 10*3/uL (ref 0.0–0.7)
Eosinophils Relative: 1.1 % (ref 0.0–5.0)
HEMATOCRIT: 40.4 % (ref 36.0–46.0)
Hemoglobin: 13.2 g/dL (ref 12.0–15.0)
LYMPHS PCT: 46.3 % — AB (ref 12.0–46.0)
Lymphs Abs: 2.2 10*3/uL (ref 0.7–4.0)
MCHC: 32.7 g/dL (ref 30.0–36.0)
MCV: 86 fl (ref 78.0–100.0)
Monocytes Absolute: 0.3 10*3/uL (ref 0.1–1.0)
Monocytes Relative: 7.2 % (ref 3.0–12.0)
NEUTROS ABS: 2.1 10*3/uL (ref 1.4–7.7)
Neutrophils Relative %: 44.2 % (ref 43.0–77.0)
PLATELETS: 421 10*3/uL — AB (ref 150.0–400.0)
RBC: 4.69 Mil/uL (ref 3.87–5.11)
RDW: 14.8 % (ref 11.5–15.5)
WBC: 4.8 10*3/uL (ref 4.0–10.5)

## 2017-07-09 LAB — LIPID PANEL
CHOL/HDL RATIO: 5
Cholesterol: 199 mg/dL (ref 0–200)
HDL: 41.9 mg/dL (ref >39.00)
LDL CALC: 140 mg/dL — AB (ref 0–99)
NONHDL: 156.62
TRIGLYCERIDES: 84 mg/dL (ref 0.0–149.0)
VLDL: 16.8 mg/dL (ref 0.0–40.0)

## 2017-07-09 LAB — TSH: TSH: 2.46 u[IU]/mL (ref 0.35–4.50)

## 2017-07-09 MED ORDER — ALPRAZOLAM 0.25 MG PO TABS: 0.2500 mg | ORAL_TABLET | Freq: Three times a day (TID) | ORAL | 1 refills | Status: DC | PRN

## 2017-07-09 NOTE — Progress Notes (Signed)
Subjective:     Jenny Cohen is a 52 y.o. female and is here for a comprehensive physical exam. The patient reports no problems.  Social History   Socioeconomic History  . Marital status: Single    Spouse name: Not on file  . Number of children: Not on file  . Years of education: Not on file  . Highest education level: Not on file  Social Needs  . Financial resource strain: Not on file  . Food insecurity - worry: Not on file  . Food insecurity - inability: Not on file  . Transportation needs - medical: Not on file  . 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 Smoking status: Never Smoker  . Smokeless tobacco: Never Used  Substance and Sexual Activity  . Alcohol use: Yes    Alcohol/week: 0.0 oz    Comment: social  . Drug use: No  . Sexual activity: Yes    Partners: Male    Birth control/protection: Condom  Other Topics Concern  . Not on file  Social History Narrative   Exercise--= daily 45 min and zumba 3 days a week   Health Maintenance  Topic Date Due  . INFLUENZA VACCINE  12/27/2016  . MAMMOGRAM  06/23/2017  . PAP SMEAR  12/21/2017  . COLONOSCOPY  08/11/2019  . TETANUS/TDAP  12/21/2024  . HIV Screening  Completed    The following portions of the patient's history were reviewed and updated as appropriate:  She  has a past medical history of Allergy, Anemia, Arthritis, Chicken pox, DUB (dysfunctional uterine bleeding), Fibroids, Heart murmur, Hyperlipidemia, and Morbid obesity ([place]). She does not have any pertinent problems on file. She  has a past surgical history that includes Cesarean section; Endometrial ablation; laparoscopy (1990); Partial hysterectomy; and Dilation and curettage of uterus. Her family history includes Alzheimer's disease in her maternal grandfather and maternal grandmother; Colon cancer in her maternal grandfather; Diabetes in her mother; Heart failure  in her paternal grandmother; Hyperlipidemia in her father; Hypertension in her father; Pancreatic cancer in her maternal uncle; Sarcoidosis in her mother; Syncope episode in her father; Throat cancer in her paternal grandfather. She  reports that  has never smoked. she has never used smokeless tobacco. She reports that she drinks alcohol. She reports that she does not use drugs. She has a current medication list which includes the following prescription(s): alprazolam, multivitamin, and naproxen, and the following Facility-Administered Medications: sodium chloride. Current Outpatient Medications on File Prior to Visit  Medication Sig Dispense Refill  . Multiple Vitamin (MULTIVITAMIN) tablet Take 1 tablet by mouth daily.    . naproxen (NAPROSYN) 500 MG tablet Take 1 tablet (500 mg total) by mouth 2 (two) times daily with a meal. 30 tablet 2   Current Facility-Administered Medications on File Prior to Visit  Medication Dose Route Frequency Provider Last Rate Last Dose  . 0.9 %  sodium chloride infusion  500 mL Intravenous Continuous Nelida Meuse III, MD       She is allergic to miconazole nitrate and vagisil..  Review of Systems Review of Systems  Constitutional: Negative for activity change, appetite change and fatigue.  HENT: Negative for hearing loss, congestion, tinnitus and ear discharge.  dentist q57m Eyes: Negative for visual disturbance (see optho q1y -- vision corrected to 20/20 with glasses).  Respiratory: Negative for cough, chest tightness and shortness of breath.   Cardiovascular: Negative  for chest pain, palpitations and leg swelling.  Gastrointestinal: Negative for abdominal pain, diarrhea, constipation and abdominal distention.  Genitourinary: Negative for urgency, frequency, decreased urine volume and difficulty urinating.  Musculoskeletal: Negative for back pain, arthralgias and gait problem.  Skin: Negative for color change, pallor and rash.  Neurological: Negative for  dizziness, light-headedness, numbness and headaches.  Hematological: Negative for adenopathy. Does not bruise/bleed easily.  Psychiatric/Behavioral: Negative for suicidal ideas, confusion, sleep disturbance, self-injury, dysphoric mood, decreased concentration and agitation.       Objective:    BP 120/68 (BP Location: Left Arm, Cuff Size: Large)   Pulse 70   Temp 98.6 F (37 C) (Oral)   Resp 16   Ht 5\' 6"  (1.676 m)   Wt 285 lb 12.8 oz (129.6 kg)   LMP 07/14/2013   SpO2 97%   BMI 46.13 kg/m  General appearance: alert, cooperative, appears stated age and no distress Head: Normocephalic, without obvious abnormality, atraumatic Eyes: conjunctivae/corneas clear. PERRL, EOM's intact. Fundi benign. Ears: normal TM's and external ear canals both ears Nose: Nares normal. Septum midline. Mucosa normal. No drainage or sinus tenderness. Throat: lips, mucosa, and tongue normal; teeth and gums normal Neck: no adenopathy, no carotid bruit, no JVD, supple, symmetrical, trachea midline and thyroid not enlarged, symmetric, no tenderness/mass/nodules Back: symmetric, no curvature. ROM normal. No CVA tenderness. Lungs: clear to auscultation bilaterally Breasts: gyn Heart: regular rate and rhythm, S1, S2 normal, no murmur, click, rub or gallop Abdomen: soft, non-tender; bowel sounds normal; no masses,  no organomegaly Pelvic: deferred--gyn Extremities: extremities normal, atraumatic, no cyanosis or edema Pulses: 2+ and symmetric Skin: Skin color, texture, turgor normal. No rashes or lesions Lymph nodes: Cervical, supraclavicular, and axillary nodes normal. Neurologic: Alert and oriented X 3, normal strength and tone. Normal symmetric reflexes. Normal coordination and gait    Assessment:    Healthy female exam.      Plan:    ghm utd Check labs  See After Visit Summary for Counseling Recommendations    1. Preventative health care See above  - CBC with Differential/Platelet -  Comprehensive metabolic panel - Lipid panel - TSH  2. Anxiety stable - ALPRAZolam (XANAX) 0.25 MG tablet; Take 1 tablet (0.25 mg total) by mouth 3 (three) times daily as needed for anxiety.  Dispense: 39 tablet; Refill: 1

## 2017-07-09 NOTE — Patient Instructions (Signed)
Preventive Care 40-64 Years, Female Preventive care refers to lifestyle choices and visits with your health care provider that can promote health and wellness. What does preventive care include?  A yearly physical exam. This is also called an annual well check.  Dental exams once or twice a year.  Routine eye exams. Ask your health care provider how often you should have your eyes checked.  Personal lifestyle choices, including: ? Daily care of your teeth and gums. ? Regular physical activity. ? Eating a healthy diet. ? Avoiding tobacco and drug use. ? Limiting alcohol use. ? Practicing safe sex. ? Taking low-dose aspirin daily starting at age 58. ? Taking vitamin and mineral supplements as recommended by your health care provider. What happens during an annual well check? The services and screenings done by your health care provider during your annual well check will depend on your age, overall health, lifestyle risk factors, and family history of disease. Counseling Your health care provider may ask you questions about your:  Alcohol use.  Tobacco use.  Drug use.  Emotional well-being.  Home and relationship well-being.  Sexual activity.  Eating habits.  Work and work Statistician.  Method of birth control.  Menstrual cycle.  Pregnancy history.  Screening You may have the following tests or measurements:  Height, weight, and BMI.  Blood pressure.  Lipid and cholesterol levels. These may be checked every 5 years, or more frequently if you are over 81 years old.  Skin check.  Lung cancer screening. You may have this screening every year starting at age 78 if you have a 30-pack-year history of smoking and currently smoke or have quit within the past 15 years.  Fecal occult blood test (FOBT) of the stool. You may have this test every year starting at age 65.  Flexible sigmoidoscopy or colonoscopy. You may have a sigmoidoscopy every 5 years or a colonoscopy  every 10 years starting at age 30.  Hepatitis C blood test.  Hepatitis B blood test.  Sexually transmitted disease (STD) testing.  Diabetes screening. This is done by checking your blood sugar (glucose) after you have not eaten for a while (fasting). You may have this done every 1-3 years.  Mammogram. This may be done every 1-2 years. Talk to your health care provider about when you should start having regular mammograms. This may depend on whether you have a family history of breast cancer.  BRCA-related cancer screening. This may be done if you have a family history of breast, ovarian, tubal, or peritoneal cancers.  Pelvic exam and Pap test. This may be done every 3 years starting at age 80. Starting at age 36, this may be done every 5 years if you have a Pap test in combination with an HPV test.  Bone density scan. This is done to screen for osteoporosis. You may have this scan if you are at high risk for osteoporosis.  Discuss your test results, treatment options, and if necessary, the need for more tests with your health care provider. Vaccines Your health care provider may recommend certain vaccines, such as:  Influenza vaccine. This is recommended every year.  Tetanus, diphtheria, and acellular pertussis (Tdap, Td) vaccine. You may need a Td booster every 10 years.  Varicella vaccine. You may need this if you have not been vaccinated.  Zoster vaccine. You may need this after age 5.  Measles, mumps, and rubella (MMR) vaccine. You may need at least one dose of MMR if you were born in  1958 or later. You may also need a second dose.  Pneumococcal 13-valent conjugate (PCV13) vaccine. You may need this if you have certain conditions and were not previously vaccinated.  Pneumococcal polysaccharide (PPSV23) vaccine. You may need one or two doses if you smoke cigarettes or if you have certain conditions.  Meningococcal vaccine. You may need this if you have certain  conditions.  Hepatitis A vaccine. You may need this if you have certain conditions or if you travel or work in places where you may be exposed to hepatitis A.  Hepatitis B vaccine. You may need this if you have certain conditions or if you travel or work in places where you may be exposed to hepatitis B.  Haemophilus influenzae type b (Hib) vaccine. You may need this if you have certain conditions.  Talk to your health care provider about which screenings and vaccines you need and how often you need them. This information is not intended to replace advice given to you by your health care provider. Make sure you discuss any questions you have with your health care provider. Document Released: 06/11/2015 Document Revised: 02/02/2016 Document Reviewed: 03/16/2015 Elsevier Interactive Patient Education  2018 Else[employer] Inc.  

## 2017-07-12 ENCOUNTER — Encounter: Payer: Self-pay | Admitting: Family Medicine

## 2017-07-13 ENCOUNTER — Other Ambulatory Visit: Payer: Self-pay | Admitting: *Deleted

## 2017-07-13 DIAGNOSIS — E785 Hyperlipidemia, unspecified: Secondary | ICD-10-CM

## 2017-07-13 MED ORDER — ROSUVASTATIN CALCIUM 10 MG PO TABS
10.0000 mg | ORAL_TABLET | Freq: Every day | ORAL | 2 refills | Status: DC
Start: 2017-07-13 — End: 2018-08-30

## 2017-07-20 ENCOUNTER — Ambulatory Visit (INDEPENDENT_AMBULATORY_CARE_PROVIDER_SITE_OTHER): Payer: Self-pay | Admitting: Family Medicine

## 2017-07-20 ENCOUNTER — Encounter: Payer: Self-pay | Admitting: Family Medicine

## 2017-07-20 ENCOUNTER — Other Ambulatory Visit: Payer: Self-pay | Admitting: Family Medicine

## 2017-07-20 VITALS — BP 120/70 | HR 86 | Temp 99.0°F | Resp 16 | Ht 66.0 in | Wt 287.0 lb

## 2017-07-20 DIAGNOSIS — D229 Melanocytic nevi, unspecified: Secondary | ICD-10-CM

## 2017-07-20 DIAGNOSIS — L918 Other hypertrophic disorders of the skin: Secondary | ICD-10-CM

## 2017-07-20 DIAGNOSIS — Z1231 Encounter for screening mammogram for malignant neoplasm of breast: Secondary | ICD-10-CM

## 2017-07-20 NOTE — Progress Notes (Signed)
Skin Tag Removal Procedure Note  Pre-operative Diagnosis: Classic skin tags (acrochordon)  Post-operative Diagnosis: Classic skin tags (acrochordon)  Locations: R axilla, L pubic area  Indications: irritated  Anesthesia: xylocaine with epi 1 % without added sodium bicarbonate  Procedure Details  The risks (including bleeding and infection) and benefits of the procedure and Written informed consent obtained. Using sterile iris scissors, multiple skin tags were snipped off at their bases after cleansing with Betadine.  Bleeding was controlled by pressure.   Findings: Pathognomonic benign lesions  not sent for pathological exam.  Condition: Stable  Complications: none.  Plan: 1. Instructed to keep the wounds dry and covered for 24-48h and clean thereafter. 2. Warning signs of infection were reviewed.   3. Recommended that the patient use OTC acetaminophen as needed for pain.  4. Return as needed.

## 2017-07-20 NOTE — Patient Instructions (Signed)
Skin Tag, Adult A skin tag (acrochordon) is a soft, extra growth of skin. Most skin tags are flesh-colored and rarely bigger than a pencil eraser. They commonly form near areas where there are folds in the skin, such as the armpit or groin. Skin tags are not dangerous, and they do not spread from person to person (are not contagious). You may have one skin tag or several. Skin tags do not require treatment. However, your health care provider may recommend removal of a skin tag if it:  Gets irritated from clothing.  Bleeds.  Is visible and unsightly.  Your health care provider can remove skin tags with a simple surgical procedure or a procedure that involves freezing the skin tag. Follow these instructions at home:  Watch for any changes in your skin tag. A normal skin tag does not require any other special care at home.  Take over-the-counter and prescription medicines only as told by your health care provider.  Keep all follow-up visits as told by your health care provider. This is important. Contact a health care provider if:  You have a skin tag that: ? Becomes painful. ? Changes color. ? Bleeds. ? Swells.  You develop more skin tags. This information is not intended to replace advice given to you by your health care provider. Make sure you discuss any questions you have with your health care provider. Document Released: 05/30/2015 Document Revised: 01/09/2016 Document Reviewed: 05/30/2015 Elsevier Interactive Patient Education  2018 Elsevier Inc.  

## 2017-07-20 NOTE — Progress Notes (Signed)
Shave Biopsy Procedure Note  Pre-operative Diagnosis: Suspicious lesion  Post-operative Diagnosis: same  Locations:anterior thigh  Indications: changing   Anesthesia: xylocaine 1% epi without added sodium bicarbonate  Procedure Details  History of allergy to iodine: no  Patient informed of the risks (including bleeding and infection) and benefits of the  procedure and Written informed consent obtained.  The lesion and surrounding area were given a sterile prep using betadyne and draped in the usual sterile fashion. A scalpel was used to shave an area of skin approximately 1cm by 1cm.  Hemostasis achieved with silver nitrate . Antibiotic ointment and a sterile dressing applied.  The specimen was sent for pathologic examination. The patient tolerated the procedure well.  EBL: 1 ml  Findings: Suspicious lesion L ant thigh  Condition: Stable  Complications: none.  Plan: 1. Instructed to keep the wound dry and covered for 24-48h and clean thereafter. 2. Warning signs of infection were reviewed.   3. Recommended that the patient use OTC acetaminophen as needed for pain.  4. Return in a few days-- if needed or any signs of infection-- keep dry 24 H.

## 2017-07-25 ENCOUNTER — Encounter (HOSPITAL_BASED_OUTPATIENT_CLINIC_OR_DEPARTMENT_OTHER): Payer: Self-pay

## 2017-07-25 ENCOUNTER — Ambulatory Visit (HOSPITAL_BASED_OUTPATIENT_CLINIC_OR_DEPARTMENT_OTHER)
Admission: RE | Admit: 2017-07-25 | Discharge: 2017-07-25 | Disposition: A | Discharge status: Home / Self Care | Payer: Self-pay | Source: Ambulatory Visit | Attending: Family Medicine | Admitting: Family Medicine

## 2017-07-25 DIAGNOSIS — Z1231 Encounter for screening mammogram for malignant neoplasm of breast: Secondary | ICD-10-CM | POA: Insufficient documentation

## 2017-08-08 ENCOUNTER — Encounter: Payer: Self-pay | Admitting: Obstetrics & Gynecology

## 2017-08-08 DIAGNOSIS — Z01419 Encounter for gynecological examination (general) (routine) without abnormal findings: Secondary | ICD-10-CM

## 2017-11-20 ENCOUNTER — Other Ambulatory Visit: Payer: Self-pay | Admitting: Family Medicine

## 2017-11-20 DIAGNOSIS — K047 Periapical abscess without sinus: Secondary | ICD-10-CM

## 2017-12-05 ENCOUNTER — Other Ambulatory Visit: Payer: Self-pay | Admitting: Family Medicine

## 2017-12-06 NOTE — Telephone Encounter (Signed)
Please advise on if refill appropriate or not.

## 2018-05-28 ENCOUNTER — Ambulatory Visit: Payer: Self-pay | Admitting: Family Medicine

## 2018-05-29 ENCOUNTER — Encounter (HOSPITAL_COMMUNITY): Payer: Self-pay | Admitting: Emergency Medicine

## 2018-05-29 ENCOUNTER — Other Ambulatory Visit: Payer: Self-pay

## 2018-05-29 ENCOUNTER — Emergency Department (HOSPITAL_COMMUNITY)
Admission: EM | Admit: 2018-05-29 | Discharge: 2018-05-29 | Disposition: A | Discharge status: Home / Self Care | Payer: Self-pay | Attending: Emergency Medicine | Admitting: Emergency Medicine

## 2018-05-29 DIAGNOSIS — H6592 Unspecified nonsuppurative otitis media, left ear: Secondary | ICD-10-CM | POA: Insufficient documentation

## 2018-05-29 DIAGNOSIS — Z79899 Other long term (current) drug therapy: Secondary | ICD-10-CM | POA: Insufficient documentation

## 2018-05-29 MED ORDER — OXYMETAZOLINE HCL 0.05 % NA SOLN
1.0000 | Freq: Once | NASAL | Status: AC
  Administered 2018-05-29: 1 via NASAL
  Filled 2018-05-29: qty 15

## 2018-05-29 MED ORDER — AMOXICILLIN-POT CLAVULANATE 875-125 MG PO TABS: 1.0000 | ORAL_TABLET | Freq: Two times a day (BID) | ORAL | 0 refills | Status: DC

## 2018-05-29 MED ORDER — OXYCODONE-ACETAMINOPHEN 5-325 MG PO TABS
2.0000 | ORAL_TABLET | Freq: Once | ORAL | Status: AC
  Administered 2018-05-29: 2 via ORAL
  Filled 2018-05-29: qty 2

## 2018-05-29 MED ORDER — AMOXICILLIN-POT CLAVULANATE 875-125 MG PO TABS
1.0000 | ORAL_TABLET | Freq: Once | ORAL | Status: AC
  Administered 2018-05-29: 1 via ORAL
  Filled 2018-05-29: qty 1

## 2018-05-29 NOTE — ED Provider Notes (Signed)
[facility] DEPT Provider Note   CSN: [id number] Arrival date & time: 05/29/18  1802     History   Chief Complaint Chief Complaint  Patient presents with  . Otalgia    HPI TANICA GAIGE is a 53 y.o. female.  HPI  53 year old female presents complaining of left ear pain for several days.  She began having a popping sensation and some drainage from the ear earlier today.  States she has been up since 330 with worsening pain.  She has had some sore throat and nasal congestion.  Denies fever or chills.  She has been eating and drinking without difficulty.  She has no chronic underlying ear or throat problems that she knows.  Past Medical History:  Diagnosis Date  . Allergy   . Anemia   . Arthritis    LEFT SHOULDER  . Chicken pox   . DUB (dysfunctional uterine bleeding)   . Fibroids   . Heart murmur    as a child  . Hyperlipidemia   . Morbid obesity ([place])    BMI 47    Patient Active Problem List   Diagnosis Date Noted  . Anxiety 02/13/2017    Past Surgical History:  Procedure Laterality Date  . ABDOMINAL HYSTERECTOMY    . CESAREAN SECTION    . DILATION AND CURETTAGE OF UTERUS    . ENDOMETRIAL ABLATION    . LAPAROSCOPY  1990   with laparotomy  . PARTIAL HYSTERECTOMY       OB History    Gravida  1   Para  1   Term  1   Preterm      AB      Living        SAB      TAB      Ectopic      Multiple      Live Births               Home Medications    Prior to Admission medications   Medication Sig Start Date End Date Taking? Authorizing Provider  ALPRAZolam (XANAX) 0.25 MG tablet Take 1 tablet (0.25 mg total) by mouth 3 (three) times daily as needed for anxiety. 07/09/17   Roma Schanz R, DO  fluconazole (DIFLUCAN) 150 MG tablet TAKE 1 TABLET(150 MG) BY MOUTH 1 TIME. REPEAT IN 3 DAYS OF NEEDED 12/06/17   Ann Held, DO  Multiple Vitamin (MULTIVITAMIN) tablet Take 1 tablet by mouth daily.     [provider]  naproxen (NAPROSYN) 500 MG tablet Take 1 tablet (500 mg total) by mouth 2 (two) times daily with a meal. 02/13/17   Carollee Herter, Alferd Apa, DO  rosuvastatin (CRESTOR) 10 MG tablet Take 1 tablet (10 mg total) by mouth daily. 07/13/17   Ann Held, DO    Family History Family History  Problem Relation Age of Onset  . Hyperlipidemia Father   . Hypertension Father   . Syncope episode Father   . Diabetes Mother   . Sarcoidosis Mother   . Pancreatic cancer Maternal Uncle   . Colon cancer Maternal Grandfather   . Alzheimer's disease Maternal Grandfather   . Alzheimer's disease Maternal Grandmother   . Heart failure Paternal Grandmother   . Throat cancer Paternal Grandfather   . Esophageal cancer Neg Hx   . Rectal cancer Neg Hx   . Stomach cancer Neg Hx     Social History Social History   Tobacco  Use  . Smoking status: Never Smoker  . Smokeless tobacco: Never Used  Substance Use Topics  . Alcohol use: Yes    Alcohol/week: 0.0 standard drinks    Comment: social  . Drug use: No     Allergies   Miconazole nitrate and Vagisil   Review of Systems Review of Systems  All other systems reviewed and are negative.    Physical Exam Updated Vital Signs BP (!) 156/86 (BP Location: Left Arm)   Pulse (!) 102   Temp 98.9 F (37.2 C) (Oral)   Resp 18   Ht 1.651 m (5\' 5" )   Wt 128.8 kg   LMP 07/14/2013   SpO2 98%   BMI 47.24 kg/m   Physical Exam Vitals signs and nursing note reviewed.  Constitutional:      Appearance: Normal appearance. She is obese.  HENT:     Head: Normocephalic and atraumatic.     Right Ear: Tympanic membrane normal.     Ears:     Comments: Left tm with serous fluid and poor landmarks, no perforation noted    Nose: Congestion present.     Mouth/Throat:     Mouth: Mucous membranes are moist.  Eyes:     Pupils: Pupils are equal, round, and reactive to light.  Neck:     Musculoskeletal: Normal range of motion.    Cardiovascular:     Rate and Rhythm: Normal rate.     Pulses: Normal pulses.  Pulmonary:     Effort: Pulmonary effort is normal.  Abdominal:     General: Abdomen is flat.  Musculoskeletal: Normal range of motion.  Skin:    General: Skin is warm and dry.     Capillary Refill: Capillary refill takes less than 2 seconds.  Neurological:     General: No focal deficit present.     Mental Status: She is alert.  Psychiatric:        Mood and Affect: Mood normal.      ED Treatments / Results  Labs (all labs ordered are listed, but only abnormal results are displayed) Labs Reviewed - No data to display  EKG None  Radiology No results found.  Procedures Procedures (including critical care time)  Medications Ordered in ED Medications  oxymetazoline (AFRIN) 0.05 % nasal spray 1 spray (has no administration in time range)  amoxicillin-clavulanate (AUGMENTIN) 875-125 MG per tablet 1 tablet (has no administration in time range)  oxyCODONE-acetaminophen (PERCOCET/ROXICET) 5-325 MG per tablet 2 tablet (has no administration in time range)     Initial Impression / Assessment and Plan / ED Course  I have reviewed the triage vital signs and the nursing notes.  Pertinent labs & imaging results that were available during my care of the patient were reviewed by me and considered in my medical decision making (see chart for details).     Patient with left otitis media Plan Afrin Percocet here Augmentin.  Patient with primary care  Final Clinical Impressions(s) / ED Diagnoses   Final diagnoses:  Left serous otitis media, unspecified chronicity    ED Discharge Orders    None       Pattricia Boss, MD 05/29/18 1949

## 2018-05-29 NOTE — Discharge Instructions (Addendum)
Please use afrin twice daily for three days. Use augmentin as prescribed Use tylenol and ibuprofen for pain

## 2018-05-29 NOTE — ED Triage Notes (Signed)
Concern for left eardrum bursting related to pus coming out today; has had left ear pain for a few days.

## 2018-05-30 ENCOUNTER — Ambulatory Visit: Payer: Self-pay | Admitting: Family Medicine

## 2018-05-30 DIAGNOSIS — Z0289 Encounter for other administrative examinations: Secondary | ICD-10-CM

## 2018-06-18 ENCOUNTER — Encounter: Payer: Self-pay | Admitting: Family Medicine

## 2018-08-16 ENCOUNTER — Other Ambulatory Visit: Payer: Self-pay | Admitting: Family Medicine

## 2018-08-16 DIAGNOSIS — M25512 Pain in left shoulder: Secondary | ICD-10-CM

## 2018-08-27 ENCOUNTER — Other Ambulatory Visit: Payer: Self-pay | Admitting: Family Medicine

## 2018-08-27 DIAGNOSIS — M25512 Pain in left shoulder: Secondary | ICD-10-CM

## 2018-08-27 DIAGNOSIS — F419 Anxiety disorder, unspecified: Secondary | ICD-10-CM

## 2018-08-27 MED ORDER — NAPROXEN 500 MG PO TABS: 500.0000 mg | ORAL_TABLET | Freq: Two times a day (BID) | ORAL | 0 refills | Status: DC

## 2018-08-27 MED ORDER — ALPRAZOLAM 0.25 MG PO TABS: 0.2500 mg | ORAL_TABLET | Freq: Three times a day (TID) | ORAL | 0 refills | Status: DC | PRN

## 2018-08-27 NOTE — Telephone Encounter (Signed)
Pt is requesting refill on alprazolam.   Last OV: 07/20/2017  Last Fill: 07/09/2017 #39 and 1RF UDS: None

## 2018-08-28 ENCOUNTER — Telehealth: Payer: Self-pay | Admitting: *Deleted

## 2018-08-28 NOTE — Telephone Encounter (Signed)
-----   Message from Ann Held, DO sent at 08/27/2018  9:10 AM EDT ----- Pt overdue for app I refilled naprosyn and xanax x 1 each but she needs f/u with labs

## 2018-08-28 NOTE — Telephone Encounter (Signed)
Left message on machine to set up webex appointment.

## 2018-08-29 ENCOUNTER — Other Ambulatory Visit (HOSPITAL_BASED_OUTPATIENT_CLINIC_OR_DEPARTMENT_OTHER): Payer: Self-pay | Admitting: Family Medicine

## 2018-08-29 ENCOUNTER — Ambulatory Visit (INDEPENDENT_AMBULATORY_CARE_PROVIDER_SITE_OTHER): Payer: Self-pay | Admitting: Family Medicine

## 2018-08-29 ENCOUNTER — Other Ambulatory Visit (HOSPITAL_BASED_OUTPATIENT_CLINIC_OR_DEPARTMENT_OTHER): Payer: Self-pay | Admitting: *Deleted

## 2018-08-29 ENCOUNTER — Other Ambulatory Visit: Payer: Self-pay

## 2018-08-29 ENCOUNTER — Encounter: Payer: Self-pay | Admitting: Family Medicine

## 2018-08-29 DIAGNOSIS — N760 Acute vaginitis: Secondary | ICD-10-CM

## 2018-08-29 DIAGNOSIS — Z1231 Encounter for screening mammogram for malignant neoplasm of breast: Secondary | ICD-10-CM

## 2018-08-29 DIAGNOSIS — E785 Hyperlipidemia, unspecified: Secondary | ICD-10-CM

## 2018-08-29 DIAGNOSIS — F419 Anxiety disorder, unspecified: Secondary | ICD-10-CM

## 2018-08-29 DIAGNOSIS — Z9289 Personal history of other medical treatment: Secondary | ICD-10-CM

## 2018-08-29 MED ORDER — FLUCONAZOLE 150 MG PO TABS: ORAL_TABLET | ORAL | 0 refills | Status: DC

## 2018-08-29 NOTE — Progress Notes (Signed)
Virtual Visit via Video Note  I connected with Jenny Cohen on 08/29/18 at  1:30 PM EDT by a video enabled telemedicine application and verified that I am speaking with the correct person using two identifiers.   I discussed the limitations of evaluation and management by telemedicine and the availability of in person appointments. The patient expressed understanding and agreed to proceed.  History of Present Illness: Pt is home requesting refills on her meds for anxiety    Observations/Objective: 298 lbs 76ft 5 in  Assessment and Plan: 1. Anxiety Stable--- con't prn xanax   - Pain Mgmt, Profile 8 w/Conf, U; Future  2. Hyperlipidemia, unspecified hyperlipidemia type con't crestor Check labs Encouraged heart healthy diet, increase exercise, avoid trans fats, consider a krill oil cap daily - Lipid panel; Future - Comprehensive metabolic panel; Future  3. Acute vaginitis  - fluconazole (DIFLUCAN) 150 MG tablet; TAKE 1 TABLET(150 MG) BY MOUTH 1 TIME. REPEAT IN 3 DAYS OF NEEDED  Dispense: 2 tablet; Refill: 0   Follow Up Instructions:    I discussed the assessment and treatment plan with the patient. The patient was provided an opportunity to ask questions and all were answered. The patient agreed with the plan and demonstrated an understanding of the instructions.   The patient was advised to call back or seek an in-person evaluation if the symptoms worsen or if the condition fails to improve as anticipated.   Ann Held, DO

## 2018-08-30 ENCOUNTER — Other Ambulatory Visit: Payer: Self-pay | Admitting: Family Medicine

## 2018-08-30 ENCOUNTER — Other Ambulatory Visit (INDEPENDENT_AMBULATORY_CARE_PROVIDER_SITE_OTHER): Payer: Self-pay

## 2018-08-30 ENCOUNTER — Other Ambulatory Visit: Payer: Self-pay

## 2018-08-30 DIAGNOSIS — F419 Anxiety disorder, unspecified: Secondary | ICD-10-CM

## 2018-08-30 DIAGNOSIS — E785 Hyperlipidemia, unspecified: Secondary | ICD-10-CM

## 2018-08-30 LAB — LIPID PANEL
Cholesterol: 217 mg/dL — ABNORMAL HIGH (ref 0–200)
HDL: 47.3 mg/dL (ref >39.00)
LDL Cholesterol: 153 mg/dL — ABNORMAL HIGH (ref 0–99)
NonHDL: 169.32
Total CHOL/HDL Ratio: 5
Triglycerides: 82 mg/dL (ref 0.0–149.0)
VLDL: 16.4 mg/dL (ref 0.0–40.0)

## 2018-08-30 LAB — COMPREHENSIVE METABOLIC PANEL
ALT: 8 U/L (ref 0–35)
AST: 10 U/L (ref 0–37)
Albumin: 3.5 g/dL (ref 3.5–5.2)
Alkaline Phosphatase: 102 U/L (ref 39–117)
BUN: 12 mg/dL (ref 6–23)
CO2: 27 mEq/L (ref 19–32)
Calcium: 8.9 mg/dL (ref 8.4–10.5)
Chloride: 103 mEq/L (ref 96–112)
Creatinine, Ser: 0.76 mg/dL (ref 0.40–1.20)
GFR: 96.15 mL/min (ref >60.00)
Glucose, Bld: 91 mg/dL (ref 70–99)
Potassium: 4.1 mEq/L (ref 3.5–5.1)
Sodium: 139 mEq/L (ref 135–145)
Total Bilirubin: 0.7 mg/dL (ref 0.2–1.2)
Total Protein: 6.5 g/dL (ref 6.0–8.3)

## 2018-08-30 MED ORDER — ROSUVASTATIN CALCIUM 20 MG PO TABS: 20.0000 mg | ORAL_TABLET | Freq: Every day | ORAL | 3 refills | Status: DC

## 2018-08-31 LAB — PAIN MGMT, PROFILE 8 W/CONF, U
6 Acetylmorphine: NEGATIVE ng/mL (ref <10)
Alcohol Metabolites: NEGATIVE ng/mL (ref <500)
Amphetamines: NEGATIVE ng/mL (ref <500)
Benzodiazepines: NEGATIVE ng/mL (ref <100)
Buprenorphine, Urine: NEGATIVE ng/mL (ref <5)
Cocaine Metabolite: NEGATIVE ng/mL (ref <150)
Creatinine: 207.7 mg/dL
MDMA: NEGATIVE ng/mL (ref <500)
Marijuana Metabolite: NEGATIVE ng/mL (ref <20)
Opiates: NEGATIVE ng/mL (ref <100)
Oxidant: NEGATIVE ug/mL (ref <200)
Oxycodone: NEGATIVE ng/mL (ref <100)
pH: 6.37 (ref 4.5–9.0)

## 2018-10-28 ENCOUNTER — Other Ambulatory Visit: Payer: Self-pay

## 2018-10-28 ENCOUNTER — Ambulatory Visit (HOSPITAL_BASED_OUTPATIENT_CLINIC_OR_DEPARTMENT_OTHER)
Admission: RE | Admit: 2018-10-28 | Discharge: 2018-10-28 | Disposition: A | Discharge status: Home / Self Care | Payer: Self-pay | Source: Ambulatory Visit | Attending: Family Medicine | Admitting: Family Medicine

## 2018-10-28 DIAGNOSIS — Z1231 Encounter for screening mammogram for malignant neoplasm of breast: Secondary | ICD-10-CM | POA: Insufficient documentation

## 2018-10-29 ENCOUNTER — Other Ambulatory Visit: Payer: Self-pay

## 2018-10-29 ENCOUNTER — Emergency Department (HOSPITAL_COMMUNITY)
Admission: EM | Admit: 2018-10-29 | Discharge: 2018-10-29 | Disposition: A | Discharge status: Home / Self Care | Payer: Self-pay | Attending: Emergency Medicine | Admitting: Emergency Medicine

## 2018-10-29 ENCOUNTER — Other Ambulatory Visit: Payer: Self-pay | Admitting: Family Medicine

## 2018-10-29 ENCOUNTER — Encounter (HOSPITAL_COMMUNITY): Payer: Self-pay | Admitting: Family Medicine

## 2018-10-29 ENCOUNTER — Ambulatory Visit: Payer: Self-pay | Admitting: *Deleted

## 2018-10-29 ENCOUNTER — Emergency Department (HOSPITAL_COMMUNITY): Payer: Self-pay

## 2018-10-29 DIAGNOSIS — R928 Other abnormal and inconclusive findings on diagnostic imaging of breast: Secondary | ICD-10-CM

## 2018-10-29 DIAGNOSIS — Y9301 Activity, walking, marching and hiking: Secondary | ICD-10-CM | POA: Insufficient documentation

## 2018-10-29 DIAGNOSIS — X509XXA Other and unspecified overexertion or strenuous movements or postures, initial encounter: Secondary | ICD-10-CM | POA: Insufficient documentation

## 2018-10-29 DIAGNOSIS — Z79899 Other long term (current) drug therapy: Secondary | ICD-10-CM | POA: Insufficient documentation

## 2018-10-29 DIAGNOSIS — S93402A Sprain of unspecified ligament of left ankle, initial encounter: Secondary | ICD-10-CM | POA: Insufficient documentation

## 2018-10-29 DIAGNOSIS — Y999 Unspecified external cause status: Secondary | ICD-10-CM | POA: Insufficient documentation

## 2018-10-29 DIAGNOSIS — Y9289 Other specified places as the place of occurrence of the external cause: Secondary | ICD-10-CM | POA: Insufficient documentation

## 2018-10-29 MED ORDER — TRAMADOL HCL 50 MG PO TABS: 50.0000 mg | ORAL_TABLET | Freq: Four times a day (QID) | ORAL | 0 refills | Status: DC | PRN

## 2018-10-29 MED ORDER — IBUPROFEN 800 MG PO TABS: 800.0000 mg | ORAL_TABLET | Freq: Three times a day (TID) | ORAL | 0 refills | Status: DC | PRN

## 2018-10-29 NOTE — Telephone Encounter (Signed)
FYI: Pt at ED now.

## 2018-10-29 NOTE — Telephone Encounter (Signed)
Pt reports fell yesterday, "Turned left ankle." States increased swelling this AM and unable to bear weight, painful to touch.  Pt directed to UC/ED. Care advise given, verbalizes understanding.   Reason for Disposition . [1] SEVERE pain (e.g., excruciating, unable to walk) AND [2] not improved after 2 hours of pain medicine    S/P fall  Answer Assessment - Initial Assessment Questions 1. ONSET: "When did the pain start?"      Fell yesterday 2. LOCATION: "Where is the pain located?"      Left ankle 3. PAIN: "How bad is the pain?"    (Scale 1-10; or mild, moderate, severe)  - MILD (1-3): doesn't interfere with normal activities   - MODERATE (4-7): interferes with normal activities (e.g., work or school) or awakens from sleep, limping   - SEVERE (8-10): excruciating pain, unable to do any normal activities, unable to walk      severe 4. WORK OR EXERCISE: "Has there been any recent work or exercise that involved this part of the body?"      Fell 5. CAUSE: "What do you think is causing the ankle pain?"     fall 6. OTHER SYMPTOMS: "Do you have any other symptoms?" (e.g., calf pain, rash, fever, swelling)     no  Protocols used: ANKLE PAIN-A-AH

## 2018-10-29 NOTE — ED Provider Notes (Signed)
[facility] DEPT Provider Note   CSN: [id number] Arrival date & time: 10/29/18  6767    History   Chief Complaint Chief Complaint  Patient presents with  . Ankle Injury    HPI Jenny Cohen is a 53 y.o. female.     HPI Patient presents to the emergency department with left ankle injury that occurred yesterday around 3 PM.  The patient states that she was walking out the back steps when she missed a step and twisted her left ankle.  Patient states that nothing seems make the condition better but certain movements palpation make the pain worse.  Patient states she did not take any medications prior to arrival for her symptoms. Past Medical History:  Diagnosis Date  . Allergy   . Anemia   . Arthritis    LEFT SHOULDER  . Chicken pox   . DUB (dysfunctional uterine bleeding)   . Fibroids   . Heart murmur    as a child  . Hyperlipidemia   . Morbid obesity ([place])    BMI 47    Patient Active Problem List   Diagnosis Date Noted  . Anxiety 02/13/2017    Past Surgical History:  Procedure Laterality Date  . ABDOMINAL HYSTERECTOMY    . CESAREAN SECTION    . DILATION AND CURETTAGE OF UTERUS    . ENDOMETRIAL ABLATION    . LAPAROSCOPY  1990   with laparotomy  . PARTIAL HYSTERECTOMY       OB History    Gravida  1   Para  1   Term  1   Preterm      AB      Living        SAB      TAB      Ectopic      Multiple      Live Births               Home Medications    Prior to Admission medications   Medication Sig Start Date End Date Taking? Authorizing Provider  ALPRAZolam (XANAX) 0.25 MG tablet Take 1 tablet (0.25 mg total) by mouth 3 (three) times daily as needed for anxiety. 08/27/18   Roma Schanz R, DO  fluconazole (DIFLUCAN) 150 MG tablet TAKE 1 TABLET(150 MG) BY MOUTH 1 TIME. REPEAT IN 3 DAYS OF NEEDED 08/29/18   Ann Held, DO  Multiple Vitamin (MULTIVITAMIN) tablet Take 1 tablet by mouth daily.     [provider]  naproxen (NAPROSYN) 500 MG tablet Take 1 tablet (500 mg total) by mouth 2 (two) times daily with a meal. 08/27/18   Carollee Herter, Alferd Apa, DO  rosuvastatin (CRESTOR) 20 MG tablet Take 1 tablet (20 mg total) by mouth daily. 08/30/18   Ann Held, DO    Family History Family History  Problem Relation Age of Onset  . Hyperlipidemia Father   . Hypertension Father   . Syncope episode Father   . Diabetes Mother   . Sarcoidosis Mother   . Pancreatic cancer Maternal Uncle   . Colon cancer Maternal Grandfather   . Alzheimer's disease Maternal Grandfather   . Alzheimer's disease Maternal Grandmother   . Heart failure Paternal Grandmother   . Throat cancer Paternal Grandfather   . Esophageal cancer Neg Hx   . Rectal cancer Neg Hx   . Stomach cancer Neg Hx     Social History Social History   Tobacco Use  .  Smoking status: Never Smoker  . Smokeless tobacco: Never Used  Substance Use Topics  . Alcohol use: Yes    Alcohol/week: 0.0 standard drinks    Comment: social  . Drug use: No     Allergies   Miconazole nitrate and Vagisil   Review of Systems Review of Systems All other systems negative except as documented in the HPI. All pertinent positives and negatives as reviewed in the HPI.  Physical Exam Updated Vital Signs BP 127/66 (BP Location: Left Arm)   Pulse 79   Temp 98 F (36.7 C) (Oral)   Resp 18   LMP 07/14/2013   SpO2 95%   Physical Exam Vitals signs and nursing note reviewed.  Constitutional:      General: She is not in acute distress.    Appearance: She is well-developed.  HENT:     Head: Normocephalic and atraumatic.  Eyes:     Pupils: Pupils are equal, round, and reactive to light.  Pulmonary:     Effort: Pulmonary effort is normal.  Musculoskeletal:     Left ankle: She exhibits decreased range of motion and swelling. She exhibits no ecchymosis, no deformity and normal pulse. Tenderness. Lateral malleolus tenderness  found. Achilles tendon normal. Achilles tendon exhibits no defect.  Skin:    General: Skin is warm and dry.  Neurological:     Mental Status: She is alert and oriented to person, place, and time.      ED Treatments / Results  Labs (all labs ordered are listed, but only abnormal results are displayed) Labs Reviewed - No data to display  EKG None  Radiology Dg Ankle Complete Left  Result Date: 10/29/2018 CLINICAL DATA:  Twisting ankle injury, pain EXAM: LEFT ANKLE COMPLETE - 3+ VIEW COMPARISON:  None. FINDINGS: Lateral malleolar tip small avulsion fracture noted with diffuse ankle soft tissue swelling. Distal tibia, talus and calcaneus appear intact. No subluxation or dislocation. No other joint abnormality. IMPRESSION: Lateral malleolar tip small avulsion fracture with diffuse ankle soft tissue swelling. Electronically Signed   By: Jerilynn Mages.  Shick M.D.   On: 10/29/2018 10:28   Mm 3d Screen Breast Bilateral  Result Date: 10/28/2018 CLINICAL DATA:  Screening. EXAM: DIGITAL SCREENING BILATERAL MAMMOGRAM WITH TOMO AND CAD COMPARISON:  Previous exam(s). ACR Breast Density Category b: There are scattered areas of fibroglandular density. FINDINGS: In the right breast, a possible asymmetry warrants further evaluation. In the left breast, no findings suspicious for malignancy. Images were processed with CAD. IMPRESSION: Further evaluation is suggested for possible asymmetry in the right breast. RECOMMENDATION: Diagnostic mammogram and possibly ultrasound of the right breast. (Code:[id number]-[id number]) The patient will be contacted regarding the findings, and additional imaging will be scheduled. BI-RADS CATEGORY  0: Incomplete. Need additional imaging evaluation and/or prior mammograms for comparison. Electronically Signed   By: Abelardo Diesel M.D.   On: 10/28/2018 10:24    Procedures Procedures (including critical care time)  Medications Ordered in ED Medications - No data to display   Initial Impression /  Assessment and Plan / ED Course  I have reviewed the triage vital signs and the nursing notes.  Pertinent labs & imaging results that were available during my care of the patient were reviewed by me and considered in my medical decision making (see chart for details).       Patient will be placed in a cam walker with crutches.  Will be referred to orthopedics.  I advised her to ice and elevate her ankle.  Told to  return here as needed. Final Clinical Impressions(s) / ED Diagnoses   Final diagnoses:  None    ED Discharge Orders    None       Dalia Heading, Hershal Coria 10/29/18 1129    Charlesetta Shanks, MD 11/05/18 1454

## 2018-10-29 NOTE — ED Notes (Signed)
Bed: WTR7 Expected date:  Expected time:  Means of arrival:  Comments: 

## 2018-10-29 NOTE — ED Triage Notes (Signed)
Patient is complaining of left ankle pain from missing a step while walking out of the back door. Patient spoke with her PCP office today and encouraged to come to the ED. Patient is unable to bear weight and using a family members crutches.

## 2018-10-29 NOTE — Discharge Instructions (Signed)
Ice and elevate your ankle.  Follow-up with the orthopedist provided.

## 2018-11-01 ENCOUNTER — Ambulatory Visit
Admission: RE | Admit: 2018-11-01 | Discharge: 2018-11-01 | Disposition: A | Discharge status: Home / Self Care | Payer: No Typology Code available for payment source | Source: Ambulatory Visit | Attending: Family Medicine | Admitting: Family Medicine

## 2018-11-01 ENCOUNTER — Ambulatory Visit: Payer: Self-pay

## 2018-11-01 ENCOUNTER — Other Ambulatory Visit: Payer: Self-pay

## 2018-11-01 DIAGNOSIS — R928 Other abnormal and inconclusive findings on diagnostic imaging of breast: Secondary | ICD-10-CM

## 2018-12-02 ENCOUNTER — Other Ambulatory Visit: Payer: Self-pay

## 2018-12-02 ENCOUNTER — Other Ambulatory Visit (INDEPENDENT_AMBULATORY_CARE_PROVIDER_SITE_OTHER): Payer: Self-pay

## 2018-12-02 DIAGNOSIS — E785 Hyperlipidemia, unspecified: Secondary | ICD-10-CM

## 2018-12-02 LAB — COMPREHENSIVE METABOLIC PANEL
ALT: 11 U/L (ref 0–35)
AST: 10 U/L (ref 0–37)
Albumin: 3.4 g/dL — ABNORMAL LOW (ref 3.5–5.2)
Alkaline Phosphatase: 96 U/L (ref 39–117)
BUN: 8 mg/dL (ref 6–23)
CO2: 27 mEq/L (ref 19–32)
Calcium: 8.4 mg/dL (ref 8.4–10.5)
Chloride: 105 mEq/L (ref 96–112)
Creatinine, Ser: 0.7 mg/dL (ref 0.40–1.20)
GFR: 105.62 mL/min (ref >60.00)
Glucose, Bld: 94 mg/dL (ref 70–99)
Potassium: 3.6 mEq/L (ref 3.5–5.1)
Sodium: 140 mEq/L (ref 135–145)
Total Bilirubin: 0.6 mg/dL (ref 0.2–1.2)
Total Protein: 6.2 g/dL (ref 6.0–8.3)

## 2018-12-02 LAB — LIPID PANEL
Cholesterol: 183 mg/dL (ref 0–200)
HDL: 43.8 mg/dL (ref >39.00)
LDL Cholesterol: 117 mg/dL — ABNORMAL HIGH (ref 0–99)
NonHDL: 139.4
Total CHOL/HDL Ratio: 4
Triglycerides: 112 mg/dL (ref 0.0–149.0)
VLDL: 22.4 mg/dL (ref 0.0–40.0)

## 2019-02-14 ENCOUNTER — Other Ambulatory Visit: Payer: Self-pay | Admitting: Family Medicine

## 2019-02-14 ENCOUNTER — Telehealth: Payer: Self-pay | Admitting: *Deleted

## 2019-02-14 DIAGNOSIS — F419 Anxiety disorder, unspecified: Secondary | ICD-10-CM

## 2019-02-14 MED ORDER — ALPRAZOLAM 0.25 MG PO TABS: 0.2500 mg | ORAL_TABLET | Freq: Three times a day (TID) | ORAL | 0 refills | Status: DC | PRN

## 2019-02-14 NOTE — Telephone Encounter (Signed)
[facility] [address] requesting refill for alprazolam  Last written: 08/27/18 Last ov: 08/29/18 Next ov: none Contract: 08/30/19 UDS: 08/30/19

## 2019-02-14 NOTE — Telephone Encounter (Signed)
refilled 

## 2019-03-11 ENCOUNTER — Ambulatory Visit (INDEPENDENT_AMBULATORY_CARE_PROVIDER_SITE_OTHER): Payer: Self-pay

## 2019-03-11 ENCOUNTER — Other Ambulatory Visit: Payer: Self-pay

## 2019-03-11 DIAGNOSIS — Z23 Encounter for immunization: Secondary | ICD-10-CM

## 2019-04-11 ENCOUNTER — Other Ambulatory Visit: Payer: Self-pay

## 2019-04-11 ENCOUNTER — Ambulatory Visit (INDEPENDENT_AMBULATORY_CARE_PROVIDER_SITE_OTHER): Payer: Self-pay | Admitting: Internal Medicine

## 2019-04-11 DIAGNOSIS — J029 Acute pharyngitis, unspecified: Secondary | ICD-10-CM

## 2019-04-11 NOTE — Progress Notes (Signed)
Subjective:    Patient ID: Jenny Cohen, female    DOB: Feb 09, 1966, 53 y.o.   MRN: [id number]  DOS:  04/11/2019 Type of visit - description:Virtual Visit via Video Note  I connected with@   by a video enabled telemedicine application and verified that I am speaking with the correct person using two identifiers.   THIS ENCOUNTER IS A VIRTUAL VISIT DUE TO COVID-19 - PATIENT WAS NOT SEEN IN THE OFFICE. PATIENT HAS CONSENTED TO VIRTUAL VISIT / TELEMEDICINE VISIT   Location of patient: home  Location of provider: office  I discussed the limitations of evaluation and management by telemedicine and the availability of in person appointments. The patient expressed understanding and agreed to proceed.  History of Present Illness: Acute Symptoms started 3 days ago with sore throat, increased with swallowing, started acutely. That particular day she ate crab cakes and she thought she could be having an allergic reaction however she denies difficulty breathing, lip or tongue swelling.  Took some Benadryl without any improvement.  Review of Systems Admits to fever, temperature 101.0 last night.  Some chills No myalgias No chest pain, cough, difficulty breathing No nausea, vomiting, diarrhea  Past Medical History:  Diagnosis Date  . Allergy   . Anemia   . Arthritis    LEFT SHOULDER  . Chicken pox   . DUB (dysfunctional uterine bleeding)   . Fibroids   . Heart murmur    as a child  . Hyperlipidemia   . Morbid obesity ([place])    BMI 47    Past Surgical History:  Procedure Laterality Date  . ABDOMINAL HYSTERECTOMY    . CESAREAN SECTION    . DILATION AND CURETTAGE OF UTERUS    . ENDOMETRIAL ABLATION    . LAPAROSCOPY  1990   with laparotomy  . PARTIAL HYSTERECTOMY      Social History   Socioeconomic History  . Marital status: Single    Spouse name: Not on file  . Number of children: Not on file  . Years of education: Not on file  . 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 Financial resource strain: Not on file  . Food insecurity    Worry: Not on file    Inability: Not on file  . Transportation needs    Medical: Not on file    Non-medical: Not on file  Tobacco Use  . Smoking status: Never Smoker  . Smokeless tobacco: Never Used  Substance and Sexual Activity  . Alcohol use: Yes    Alcohol/week: 0.0 standard drinks    Comment: social  . Drug use: No  . Sexual activity: Yes    Partners: Male    Birth control/protection: Condom  Lifestyle  . Physical activity    Days per week: 2 days    Minutes per session: 50 min  . Stress: Not at all  Relationships  . Social Herbalist on phone: Not on file    Gets together: Not on file    Attends religious service: Not on file    Active member of club or organization: Not on file    Attends meetings of clubs or organizations: Not on file    Relationship status: Not on file  . Intimate partner violence    Fear of current or ex partner: Not on file    Emotionally abused: Not on file  Physically abused: Not on file    Forced sexual activity: Not on file  Other Topics Concern  . Not on file  Social History Narrative   Exercise--= daily 45 min and zumba 3 days a week      Allergies as of 04/11/2019      Reactions   Miconazole Nitrate Itching   Vagisil Itching      Medication List       Accurate as of April 11, 2019 11:53 AM. If you have any questions, ask your nurse or doctor.        ALPRAZolam 0.25 MG tablet Commonly known as: XANAX Take 1 tablet (0.25 mg total) by mouth 3 (three) times daily as needed for anxiety.   fluconazole 150 MG tablet Commonly known as: DIFLUCAN TAKE 1 TABLET(150 MG) BY MOUTH 1 TIME. REPEAT IN 3 DAYS OF NEEDED   ibuprofen 800 MG tablet Commonly known as: ADVIL Take 1 tablet (800 mg total) by mouth every 8 (eight) hours as needed.   multivitamin tablet Take 1  tablet by mouth daily.   naproxen 500 MG tablet Commonly known as: Naprosyn Take 1 tablet (500 mg total) by mouth 2 (two) times daily with a meal.   rosuvastatin 20 MG tablet Commonly known as: Crestor Take 1 tablet (20 mg total) by mouth daily.   traMADol 50 MG tablet Commonly known as: ULTRAM Take 1 tablet (50 mg total) by mouth every 6 (six) hours as needed for severe pain.           Objective:   Physical Exam LMP 07/14/2013  This is a virtual video visit.  She is alert oriented x3, voice is normal, face and neck looks symmetric    Assessment    Sore throat: Sore throat associated with low-grade fever, DDX include Covid, URI, strep throat, etc. She does not look toxic. Number of cases of Covid in our community are increasing lately. She knows the limitations of this visit. Plan: Test for COVID-19 at our [facility] facility or a local pharmacy and let me know the results Increase precautions  at home as the patient and her parents live together, use a mask when they are in the same room. If symptoms increase: Severe sore throat, if she develops chest pain-shortness of breath-myalgias-needs to go to urgent care. Further advised with results.    I discussed the assessment and treatment plan with the patient. The patient was provided an opportunity to ask questions and all were answered. The patient agreed with the plan and demonstrated an understanding of the instructions.   The patient was advised to call back or seek an in-person evaluation if the symptoms worsen or if the condition fails to improve as anticipated.

## 2019-04-14 ENCOUNTER — Other Ambulatory Visit: Payer: Self-pay

## 2019-04-14 DIAGNOSIS — Z20822 Contact with and (suspected) exposure to covid-19: Secondary | ICD-10-CM

## 2019-04-16 ENCOUNTER — Other Ambulatory Visit: Payer: Self-pay | Admitting: Family Medicine

## 2019-04-16 ENCOUNTER — Telehealth: Payer: Self-pay | Admitting: Family Medicine

## 2019-04-16 DIAGNOSIS — J029 Acute pharyngitis, unspecified: Secondary | ICD-10-CM

## 2019-04-16 LAB — NOVEL CORONAVIRUS, NAA: SARS-CoV-2, NAA: NOT DETECTED

## 2019-04-16 MED ORDER — AMOXICILLIN 875 MG PO TABS: 875.0000 mg | ORAL_TABLET | Freq: Two times a day (BID) | ORAL | 0 refills | Status: DC

## 2019-04-16 NOTE — Telephone Encounter (Signed)
Amoxicillin sent to pharmacy

## 2019-04-16 NOTE — Telephone Encounter (Signed)
Pt seen by Dr. Larose Kells on 04/14/2019 for sore throat. COVID test is negative. Pt still having Sx. Please advise

## 2019-04-16 NOTE — Telephone Encounter (Signed)
Copied from [facility] [id number]. Topic: General - Other >> Apr 16, 2019  8:33 AM Keene Breath wrote: Reason for CRM: Patient called to inform the doctor that her COVID test was negative but her throat is still sore.  She would like to know what should be her next step.  Should she make an appt. Or can the doctor call her in a script.  Please advise and call patient back at [phone]

## 2019-04-16 NOTE — Telephone Encounter (Signed)
Pt notifed.

## 2019-05-06 ENCOUNTER — Other Ambulatory Visit: Payer: Self-pay | Admitting: Family Medicine

## 2019-05-06 ENCOUNTER — Telehealth: Payer: Self-pay | Admitting: *Deleted

## 2019-05-06 DIAGNOSIS — N760 Acute vaginitis: Secondary | ICD-10-CM

## 2019-05-06 MED ORDER — FLUCONAZOLE 150 MG PO TABS: ORAL_TABLET | ORAL | 0 refills | Status: DC

## 2019-05-06 NOTE — Telephone Encounter (Signed)
Pt.notified

## 2019-05-06 NOTE — Telephone Encounter (Signed)
I sent in diflucan

## 2019-05-06 NOTE — Telephone Encounter (Signed)
Pt states she is having vaginal itching since completing the amoxicillin for her sore throat. Pt states she sometimes gets yeast infections with antibiotics and is requesting Rx for diflucan 150mg , #2.  Please advise? Would like RX sent to [facility]. Ok to respond to pt via mychart per pt.

## 2019-05-30 HISTORY — PX: COLONOSCOPY: SHX174

## 2019-05-30 HISTORY — PX: POLYPECTOMY: SHX149

## 2019-05-31 ENCOUNTER — Encounter: Payer: Self-pay | Admitting: Family Medicine

## 2019-06-02 NOTE — Telephone Encounter (Signed)
Results printed and placed in scan

## 2019-06-04 ENCOUNTER — Encounter (HOSPITAL_COMMUNITY): Payer: Self-pay

## 2019-06-04 ENCOUNTER — Emergency Department (HOSPITAL_COMMUNITY)
Admission: EM | Admit: 2019-06-04 | Discharge: 2019-06-04 | Disposition: A | Discharge status: Home / Self Care | Payer: No Typology Code available for payment source | Attending: Emergency Medicine | Admitting: Emergency Medicine

## 2019-06-04 ENCOUNTER — Ambulatory Visit: Payer: Self-pay

## 2019-06-04 ENCOUNTER — Emergency Department (HOSPITAL_COMMUNITY): Payer: No Typology Code available for payment source

## 2019-06-04 ENCOUNTER — Other Ambulatory Visit: Payer: Self-pay

## 2019-06-04 DIAGNOSIS — U071 COVID-19: Secondary | ICD-10-CM | POA: Insufficient documentation

## 2019-06-04 DIAGNOSIS — R55 Syncope and collapse: Secondary | ICD-10-CM | POA: Insufficient documentation

## 2019-06-04 DIAGNOSIS — Z79899 Other long term (current) drug therapy: Secondary | ICD-10-CM | POA: Insufficient documentation

## 2019-06-04 DIAGNOSIS — E876 Hypokalemia: Secondary | ICD-10-CM | POA: Insufficient documentation

## 2019-06-04 LAB — CBC
HCT: 47.8 % — ABNORMAL HIGH (ref 36.0–46.0)
Hemoglobin: 14.7 g/dL (ref 12.0–15.0)
MCH: 27.5 pg (ref 26.0–34.0)
MCHC: 30.8 g/dL (ref 30.0–36.0)
MCV: 89.5 fL (ref 80.0–100.0)
Platelets: 374 10*3/uL (ref 150–400)
RBC: 5.34 MIL/uL — ABNORMAL HIGH (ref 3.87–5.11)
RDW: 13.9 % (ref 11.5–15.5)
WBC: 3.1 10*3/uL — ABNORMAL LOW (ref 4.0–10.5)
nRBC: 0 % (ref 0.0–0.2)

## 2019-06-04 LAB — HEPATIC FUNCTION PANEL
ALT: 18 U/L (ref 0–44)
AST: 22 U/L (ref 15–41)
Albumin: 3.7 g/dL (ref 3.5–5.0)
Alkaline Phosphatase: 98 U/L (ref 38–126)
Bilirubin, Direct: 0.1 mg/dL (ref 0.0–0.2)
Indirect Bilirubin: 0.3 mg/dL (ref 0.3–0.9)
Total Bilirubin: 0.4 mg/dL (ref 0.3–1.2)
Total Protein: 7.9 g/dL (ref 6.5–8.1)

## 2019-06-04 LAB — BASIC METABOLIC PANEL
Anion gap: 10 (ref 5–15)
BUN: 11 mg/dL (ref 6–20)
CO2: 29 mmol/L (ref 22–32)
Calcium: 9.1 mg/dL (ref 8.9–10.3)
Chloride: 98 mmol/L (ref 98–111)
Creatinine, Ser: 0.74 mg/dL (ref 0.44–1.00)
GFR calc Af Amer: >60 mL/min (ref >60)
GFR calc non Af Amer: >60 mL/min (ref >60)
Glucose, Bld: 152 mg/dL — ABNORMAL HIGH (ref 70–99)
Potassium: 3 mmol/L — ABNORMAL LOW (ref 3.5–5.1)
Sodium: 137 mmol/L (ref 135–145)

## 2019-06-04 LAB — LIPASE, BLOOD: Lipase: 21 U/L (ref 11–51)

## 2019-06-04 MED ORDER — POTASSIUM CHLORIDE CRYS ER 20 MEQ PO TBCR
40.0000 meq | EXTENDED_RELEASE_TABLET | Freq: Once | ORAL | Status: AC
  Administered 2019-06-04: 40 meq via ORAL
  Filled 2019-06-04: qty 2

## 2019-06-04 MED ORDER — ONDANSETRON HCL 4 MG/2ML IJ SOLN
4.0000 mg | Freq: Once | INTRAMUSCULAR | Status: AC
  Administered 2019-06-04: 21:00:00 4 mg via INTRAVENOUS
  Filled 2019-06-04: qty 2

## 2019-06-04 MED ORDER — SODIUM CHLORIDE 0.9 % IV SOLN
1000.0000 mL | INTRAVENOUS | Status: DC
  Administered 2019-06-04: 21:00:00 1000 mL via INTRAVENOUS

## 2019-06-04 MED ORDER — SODIUM CHLORIDE 0.9 % IV BOLUS (SEPSIS)
500.0000 mL | Freq: Once | INTRAVENOUS | Status: AC
  Administered 2019-06-04: 500 mL via INTRAVENOUS

## 2019-06-04 MED ORDER — SODIUM CHLORIDE 0.9% FLUSH: 3.0000 mL | Freq: Once | INTRAVENOUS | Status: DC

## 2019-06-04 NOTE — Telephone Encounter (Signed)
Noted and agree. 

## 2019-06-04 NOTE — Telephone Encounter (Signed)
  Patient called stating that she has been Dx COVID-19 positive.  She has been passing out and feeling nauseous and dizzy.  She states she passed out on Sunday and hit the floor  She has a knot on her head which she states is not bad.  Today she felt herself getting weak and went to the floor before LOC. She has been vomiting and she states she had diarrhea but it stopped on Monday. She has abdominal pain. She states she has pain to rt chest that travels under her arm to her back. Per protocol patient will go to [facility] ER for evaluation. ER notified that patient was COVID-19 positive. She has family to drive her and denied need for EMS transport. Reason for Disposition . [1] Fainted > 15 minutes ago AND [2] still feels weak or dizzy  Answer Assessment - Initial Assessment Questions 1. ONSET: "How long were you unconscious?" (minutes) "When did it happen?"     Sunday and today 2. CONTENT: "What happened during period of unconsciousness?" (e.g., seizure activity)      no 3. MENTAL STATUS: "Alert and oriented now?" (oriented x 3 = name, month, location)    oriented 4. TRIGGER: "What do you think caused the fainting?" "What were you doing just before you fainted?"  (e.g., exercise, sudden standing up, prolonged standing)    Vomiting has been able to eat 5. RECURRENT SYMPTOM: "Have you ever passed out before?" If so, ask: "When was the last time?" and "What happened that time?"      1 year ago when had the flu 6. INJURY: "Did you sustain any injury during the fall?"      Knot on head 7. CARDIAC SYMPTOMS: "Have you had any of the following symptoms: chest pain, difficulty breathing, palpitations?"     no 8. NEUROLOGIC SYMPTOMS: "Have you had any of the following symptoms: headache, numbness, vertigo, weakness?"   sweats 9. GI SYMPTOMS: "Have you had any of the following symptoms: abdominal pain, vomiting, diarrhea, blood in stools?"     Abdominal pain rt side pain rt side chest goes to back under  shoulder blade 10. OTHER SYMPTOMS: "Do you have any other symptoms?"       Diarrhea ended Monday 11. PREGNANCY: "Is there any chance you are pregnant?" "When was your last menstrual period?"      No  Protocols used: [facility]

## 2019-06-04 NOTE — ED Triage Notes (Signed)
Pt reports testing COVID+ on Saturday. Pt stated starting Sunday she has experienced several blackouts. Pt states she has N/V and sweats associated with the blackouts. Pt reports hitting her head on Sunday, denies using blood thinners however has a headache and pain a round her eyes.

## 2019-06-04 NOTE — ED Notes (Signed)
Pt ambulatory from triage with steady gait 

## 2019-06-04 NOTE — Telephone Encounter (Signed)
FYI

## 2019-06-04 NOTE — ED Provider Notes (Signed)
[facility] DEPT Provider Note   CSN: [id number] Arrival date & time: 06/04/19  1620     History Chief Complaint  Patient presents with  . COVID+  . Dizziness  . Loss of Consciousness    Jenny Cohen is a 54 y.o. female.  HPI   Patient presents to the emergency room for evaluation of syncopal episodes associated with a Covid virus infection.  Patient states she was diagnosed with Covid on Saturday.  Since Sunday the patient's had several episodes of lightheadedness and blackouts.  Patient has been having trouble with nausea and vomiting and sweating associated with this she did have one episode on Sunday where she hit her head.  Patient is having a headache.  She had another episode of feeling very weak as if she was going to blackout and she was able to get herself down.  Patient called her doctor who suggested she come to the ED for evaluation.  Patient is also having some pain in her lower abdomen that sharp.  Past Medical History:  Diagnosis Date  . Allergy   . Anemia   . Arthritis    LEFT SHOULDER  . Chicken pox   . DUB (dysfunctional uterine bleeding)   . Fibroids   . Heart murmur    as a child  . Hyperlipidemia   . Morbid obesity ([place])    BMI 47    Patient Active Problem List   Diagnosis Date Noted  . Anxiety 02/13/2017    Past Surgical History:  Procedure Laterality Date  . ABDOMINAL HYSTERECTOMY    . CESAREAN SECTION    . DILATION AND CURETTAGE OF UTERUS    . ENDOMETRIAL ABLATION    . LAPAROSCOPY  1990   with laparotomy  . PARTIAL HYSTERECTOMY       OB History    Gravida  1   Para  1   Term  1   Preterm      AB      Living        SAB      TAB      Ectopic      Multiple      Live Births              Family History  Problem Relation Age of Onset  . Hyperlipidemia Father   . Hypertension Father   . Syncope episode Father   . Diabetes Mother   . Sarcoidosis Mother   . Pancreatic cancer  Maternal Uncle   . Colon cancer Maternal Grandfather   . Alzheimer's disease Maternal Grandfather   . Alzheimer's disease Maternal Grandmother   . Heart failure Paternal Grandmother   . Throat cancer Paternal Grandfather   . Esophageal cancer Neg Hx   . Rectal cancer Neg Hx   . Stomach cancer Neg Hx     Social History   Tobacco Use  . Smoking status: Never Smoker  . Smokeless tobacco: Never Used  Substance Use Topics  . Alcohol use: Yes    Alcohol/week: 0.0 standard drinks    Comment: social  . Drug use: No    Home Medications Prior to Admission medications   Medication Sig Start Date End Date Taking? Authorizing Provider  ALPRAZolam (XANAX) 0.25 MG tablet Take 1 tablet (0.25 mg total) by mouth 3 (three) times daily as needed for anxiety. 02/14/19  Yes Roma Schanz R, DO  Multiple Vitamin (MULTIVITAMIN) tablet Take 1 tablet by mouth daily.  Yes [provider]  amoxicillin (AMOXIL) 875 MG tablet Take 1 tablet (875 mg total) by mouth 2 (two) times daily. Patient not taking: Reported on 06/04/2019 04/16/19   Carollee Herter, Alferd Apa, DO  fluconazole (DIFLUCAN) 150 MG tablet 1 po x1, may repeat in 3 days prn Patient not taking: Reported on 06/04/2019 05/06/19   Ann Held, DO  ibuprofen (ADVIL) 800 MG tablet Take 1 tablet (800 mg total) by mouth every 8 (eight) hours as needed. Patient not taking: Reported on 04/11/2019 10/29/18   Dalia Heading, PA-C  naproxen (NAPROSYN) 500 MG tablet Take 1 tablet (500 mg total) by mouth 2 (two) times daily with a meal. Patient not taking: Reported on 04/11/2019 08/27/18   Ann Held, DO    Allergies    Miconazole nitrate and Vagisil  Review of Systems   Review of Systems  All other systems reviewed and are negative.   Physical Exam Updated Vital Signs BP (!) 139/58   Pulse 86   Temp 99.3 F (37.4 C) (Oral)   Resp 20   Ht 1.651 m (5\' 5" )   Wt 131.6 kg   LMP 07/14/2013   SpO2 97%   BMI 48.29 kg/m    Physical Exam Vitals and nursing note reviewed.  Constitutional:      General: She is not in acute distress.    Appearance: She is well-developed.  HENT:     Head: Normocephalic and atraumatic.     Right Ear: External ear normal.     Left Ear: External ear normal.  Eyes:     General: No scleral icterus.       Right eye: No discharge.        Left eye: No discharge.     Conjunctiva/sclera: Conjunctivae normal.  Neck:     Trachea: No tracheal deviation.  Cardiovascular:     Rate and Rhythm: Normal rate and regular rhythm.  Pulmonary:     Effort: Pulmonary effort is normal. No respiratory distress.     Breath sounds: Normal breath sounds. No stridor. No wheezing or rales.  Abdominal:     General: Bowel sounds are normal. There is no distension.     Palpations: Abdomen is soft.     Tenderness: There is no abdominal tenderness. There is no guarding or rebound.  Musculoskeletal:        General: No tenderness.     Cervical back: Neck supple.  Skin:    General: Skin is warm and dry.     Findings: No rash.  Neurological:     Mental Status: She is alert.     Cranial Nerves: No cranial nerve deficit (no facial droop, extraocular movements intact, no slurred speech).     Sensory: No sensory deficit.     Motor: No abnormal muscle tone or seizure activity.     Coordination: Coordination normal.     ED Results / Procedures / Treatments   Labs (all labs ordered are listed, but only abnormal results are displayed) Labs Reviewed  BASIC METABOLIC PANEL - Abnormal; Notable for the following components:      Result Value   Potassium 3.0 (*)    Glucose, Bld 152 (*)    All other components within normal limits  CBC - Abnormal; Notable for the following components:   WBC 3.1 (*)    RBC 5.34 (*)    HCT 47.8 (*)    All other components within normal limits  HEPATIC FUNCTION PANEL  LIPASE, BLOOD  URINALYSIS, ROUTINE W REFLEX MICROSCOPIC    EKG EKG Interpretation  Date/Time:   Wednesday June 04 2019 16:42:43 EST Ventricular Rate:  95 PR Interval:  142 QRS Duration: 74 QT Interval:  336 QTC Calculation: 422 R Axis:   -8 Text Interpretation: Normal sinus rhythm Cannot rule out Inferior infarct , age undetermined Abnormal ECG No significant change since last tracing Confirmed by Dorie Rank [phone]) on 06/04/2019 8:45:08 PM   Radiology CT Head Wo Contrast  Result Date: 06/04/2019 CLINICAL DATA:  Headache. EXAM: CT HEAD WITHOUT CONTRAST TECHNIQUE: Contiguous axial images were obtained from the base of the skull through the vertex without intravenous contrast. COMPARISON:  None. FINDINGS: Brain: No evidence of acute infarction, hemorrhage, hydrocephalus, extra-axial collection or mass lesion/mass effect. Vascular: No hyperdense vessel or unexpected calcification. Skull: Normal. Negative for fracture or focal lesion. Sinuses/Orbits: No acute finding. Other: None. IMPRESSION: Normal CT head without contrast. Electronically Signed   By: Virgina Norfolk M.D.   On: 06/04/2019 18:13   DG Chest Portable 1 View  Result Date: 06/04/2019 CLINICAL DATA:  Dyspnea. EXAM: PORTABLE CHEST 1 VIEW COMPARISON:  June 07, 2014 FINDINGS: Mild atelectasis and/or infiltrate is seen within the left lung base. There is no evidence of a pleural effusion or pneumothorax. The heart size and mediastinal contours are within normal limits. The visualized skeletal structures are unremarkable. IMPRESSION: 1. Mild left basilar atelectasis and/or infiltrate. Electronically Signed   By: Virgina Norfolk M.D.   On: 06/04/2019 21:17    Procedures Procedures (including critical care time)  Medications Ordered in ED Medications  sodium chloride flush (NS) 0.9 % injection 3 mL (has no administration in time range)  sodium chloride 0.9 % bolus 500 mL (0 mLs Intravenous Stopped 06/04/19 2214)    Followed by  0.9 %  sodium chloride infusion (1,000 mLs Intravenous New Bag/Given 06/04/19 2110)  ondansetron  (ZOFRAN) injection 4 mg (4 mg Intravenous Given 06/04/19 2112)  potassium chloride SA (KLOR-CON) CR tablet 40 mEq (40 mEq Oral Given 06/04/19 2227)    ED Course  I have reviewed the triage vital signs and the nursing notes.  Pertinent labs & imaging results that were available during my care of the patient were reviewed by me and considered in my medical decision making (see chart for details).  Clinical Course as of Jun 03 2242  Wed Jun 04, 2019  2147 Head CT without acute findings.  Chest x-ray with questionable infiltrate versus atelectasis   [JK]    Clinical Course User Index [JK] Dorie Rank, MD   MDM Rules/Calculators/A&P                      Patient presents to the ED for evaluation of syncopal episodes associated with known Covid virus infection.  Patient's ED work-up is reassuring.  Laboratory tests show hypokalemia but no other significant abnormalities.  Chest x-ray shows mild atelectasis or infiltrate but no widespread infiltrates consistent with Covid pneumonia.  Patient is breathing easily.  Oxygenation is normal.  Vital signs have remained normal.  I suspect her syncopal episodes may have been vasovagal and partly related to dehydration associated with her poor p.o. intake.  Patient was hydrated and stable for discharge.  Warning signs precautions discussed.  LERAE COUNCE was evaluated in Emergency Department on 06/04/2019 for the symptoms described in the history of present illness. She was evaluated in the context of the global COVID-19 pandemic, which necessitated consideration that the patient  might be at risk for infection with the SARS-CoV-2 virus that causes COVID-19. Institutional protocols and algorithms that pertain to the evaluation of patients at risk for COVID-19 are in a state of rapid change based on information released by regulatory bodies including the CDC and federal and state organizations. These policies and algorithms were followed during the patient's care in  the ED.  Final Clinical Impression(s) / ED Diagnoses Final diagnoses:  [id number] virus infection  Hypokalemia  Syncope, unspecified syncope type    Rx / DC Orders ED Discharge Orders    None       Dorie Rank, MD 06/04/19 2246

## 2019-06-04 NOTE — Discharge Instructions (Addendum)
Make sure to stay hydrated.  If you start feeling dizzy or lightheaded immediately lie down.  Continue over-the-counter medications as needed for cough and fever.  Return to the ED for shortness of breath or other concerning symptoms.

## 2019-06-27 ENCOUNTER — Ambulatory Visit (INDEPENDENT_AMBULATORY_CARE_PROVIDER_SITE_OTHER): Payer: No Typology Code available for payment source | Admitting: Internal Medicine

## 2019-06-27 DIAGNOSIS — Z8616 Personal history of COVID-19: Secondary | ICD-10-CM

## 2019-06-27 DIAGNOSIS — R05 Cough: Secondary | ICD-10-CM

## 2019-06-27 DIAGNOSIS — R059 Cough, unspecified: Secondary | ICD-10-CM

## 2019-06-27 MED ORDER — HYDROCODONE-HOMATROPINE 5-1.5 MG/5ML PO SYRP: 5.0000 mL | ORAL_SOLUTION | Freq: Every evening | ORAL | 0 refills | Status: DC | PRN

## 2019-06-27 MED ORDER — AZITHROMYCIN 250 MG PO TABS: ORAL_TABLET | ORAL | 0 refills | Status: DC

## 2019-06-27 NOTE — Progress Notes (Signed)
   Subjective:    Patient ID: Jenny Cohen, female    DOB: 1966-04-30, 54 y.o.   MRN: [id number]  DOS:  06/27/2019 Type of visit - description: Virtual Visit via Video Note  I connected with the above patient  by a video enabled telemedicine application and verified that I am speaking with the correct person using two identifiers.   THIS ENCOUNTER IS A VIRTUAL VISIT DUE TO COVID-19 - PATIENT WAS NOT SEEN IN THE OFFICE. PATIENT HAS CONSENTED TO VIRTUAL VISIT / TELEMEDICINE VISIT   Location of patient: home  Location of provider: office  I discussed the limitations of evaluation and management by telemedicine and the availability of in person appointments. The patient expressed understanding and agreed to proceed.   Acute:  Patient tested + for Covid on 05/31/2019, she treated herself conservatively with rest, ibuprofen. She thinks she got dehydrated and passed out x 2. She went to the ER 06/04/2019  CT head no acute, BMP with a potassium of 3.0, LFTs normal, chest x-ray with mild left basilar atelectasis and/or/infiltrate, she received fluids and potassium supplements  Reason for today's  Visit: Persistent cough, at times severe. She has trying OTC dosings, Mucinex, "cough drops" with no help.  She is currently feeling otherwise well, drinking fluids, no LOC again.  Review of Systems No fever chills Having occasional chest pain and shortness of breath only with episodes of cough. No ambulatory BPs.   Past Medical History:  Diagnosis Date  . Allergy   . Anemia   . Arthritis    LEFT SHOULDER  . Chicken pox   . DUB (dysfunctional uterine bleeding)   . Fibroids   . Heart murmur    as a child  . Hyperlipidemia   . Morbid obesity ([place])    BMI 47    Past Surgical History:  Procedure Laterality Date  . ABDOMINAL HYSTERECTOMY    . CESAREAN SECTION    . DILATION AND CURETTAGE OF UTERUS    . ENDOMETRIAL ABLATION    . LAPAROSCOPY  1990   with laparotomy  . PARTIAL  HYSTERECTOMY          Objective:   Physical Exam LMP 07/14/2013  She is alert oriented x3, in no apparent distress.  Speaking in complete sentences.  She had a mild cough a couple of times during the visit. O2 sat 97-98% (check during this visit)    Assessment    54 year old female, history of hysterectomy, presents with:  Cough: She was diagnosed with Covid with a positive test on 05/31/2019, went to the ER with apparent dehydration, otherwise was treated conservatively. She is feeling well except for persistent, moderate cough.  Only a small amount of clear sputum is noted. This could be post viral cough versus superimposed bacterial bronchitis. Plan: Mucinex DM, nocturnal cough control with hydrocodone, prescription sent, watch for excessive drowsiness, Zithromax. If not improving recommend office visit in person, for further evaluation. She verbalized understanding.  I discussed the assessment and treatment plan with the patient. The patient was provided an opportunity to ask questions and all were answered. The patient agreed with the plan and demonstrated an understanding of the instructions.   The patient was advised to call back or seek an in-person evaluation if the symptoms worsen or if the condition fails to improve as anticipated.

## 2019-07-02 ENCOUNTER — Other Ambulatory Visit: Payer: Self-pay | Admitting: Family Medicine

## 2019-07-02 DIAGNOSIS — N760 Acute vaginitis: Secondary | ICD-10-CM

## 2019-07-02 MED ORDER — FLUCONAZOLE 150 MG PO TABS: ORAL_TABLET | ORAL | 0 refills | Status: DC

## 2019-08-13 ENCOUNTER — Other Ambulatory Visit: Payer: Self-pay | Admitting: Obstetrics and Gynecology

## 2019-08-13 DIAGNOSIS — R102 Pelvic and perineal pain: Secondary | ICD-10-CM

## 2019-08-27 ENCOUNTER — Other Ambulatory Visit: Payer: Self-pay

## 2019-08-27 ENCOUNTER — Ambulatory Visit
Admission: RE | Admit: 2019-08-27 | Discharge: 2019-08-27 | Disposition: A | Discharge status: Home / Self Care | Payer: No Typology Code available for payment source | Source: Ambulatory Visit | Attending: Obstetrics and Gynecology | Admitting: Obstetrics and Gynecology

## 2019-08-27 DIAGNOSIS — R102 Pelvic and perineal pain: Secondary | ICD-10-CM

## 2019-08-27 MED ORDER — IOPAMIDOL (ISOVUE-300) INJECTION 61%
125.0000 mL | Freq: Once | INTRAVENOUS | Status: AC | PRN
  Administered 2019-08-27: 125 mL via INTRAVENOUS

## 2019-09-01 ENCOUNTER — Ambulatory Visit (AMBULATORY_SURGERY_CENTER): Payer: Self-pay | Admitting: *Deleted

## 2019-09-01 ENCOUNTER — Other Ambulatory Visit: Payer: Self-pay

## 2019-09-01 VITALS — Ht 66.0 in | Wt 300.8 lb

## 2019-09-01 DIAGNOSIS — Z86010 Personal history of colonic polyps: Secondary | ICD-10-CM

## 2019-09-01 NOTE — Progress Notes (Signed)

## 2019-09-04 ENCOUNTER — Telehealth: Payer: Self-pay | Admitting: Family Medicine

## 2019-09-04 NOTE — Telephone Encounter (Signed)
I have seen it

## 2019-09-04 NOTE — Telephone Encounter (Signed)
Patient called regarding CT SCAN , that was done on 08/27/19. Patient wants to know if DR. Lowne is aware of her results . Please advise

## 2019-09-04 NOTE — Telephone Encounter (Signed)
Ct ordered by another provider. Patient making you aware of results.

## 2019-09-05 ENCOUNTER — Encounter: Payer: Self-pay | Admitting: Family Medicine

## 2019-09-05 NOTE — Telephone Encounter (Signed)
That would be something to discuss with the GI doing the colonoscopy -----  I don't see the ct in our system-- where was it done ? Who is doing colon?

## 2019-09-10 ENCOUNTER — Telehealth: Payer: Self-pay

## 2019-09-10 NOTE — Telephone Encounter (Signed)
Called and spoke to pt to confirm if she has received COVID test or vaccine required for upcoming colonoscopy.  Pt confirmed that she had her 2nd dose of the COVID vaccine trough the [facility] on 08-30-19.

## 2019-09-12 ENCOUNTER — Other Ambulatory Visit: Payer: Self-pay

## 2019-09-12 ENCOUNTER — Encounter: Payer: Self-pay | Admitting: Gastroenterology

## 2019-09-12 ENCOUNTER — Ambulatory Visit (AMBULATORY_SURGERY_CENTER): Payer: Self-pay | Admitting: Gastroenterology

## 2019-09-12 VITALS — BP 98/63 | HR 63 | Temp 97.3°F | Resp 10 | Ht 66.0 in | Wt 300.8 lb

## 2019-09-12 DIAGNOSIS — D123 Benign neoplasm of transverse colon: Secondary | ICD-10-CM

## 2019-09-12 DIAGNOSIS — Z86010 Personal history of colonic polyps: Secondary | ICD-10-CM

## 2019-09-12 DIAGNOSIS — K635 Polyp of colon: Secondary | ICD-10-CM

## 2019-09-12 DIAGNOSIS — D124 Benign neoplasm of descending colon: Secondary | ICD-10-CM

## 2019-09-12 MED ORDER — SODIUM CHLORIDE 0.9 % IV SOLN: 500.0000 mL | Freq: Once | INTRAVENOUS | Status: DC

## 2019-09-12 NOTE — Patient Instructions (Signed)
Handouts given:  Polyps Resume previous diet Continue current medications Await pathology results YOU HAD AN ENDOSCOPIC PROCEDURE TODAY AT THE [facility]:   Refer to the procedure report that was given to you for any specific questions about what was found during the examination.  If the procedure report does not answer your questions, please call your gastroenterologist to clarify.  If you requested that your care partner not be given the details of your procedure findings, then the procedure report has been included in a sealed envelope for you to review at your convenience later.  YOU SHOULD EXPECT: Some feelings of bloating in the abdomen. Passage of more gas than usual.  Walking can help get rid of the air that was put into your GI tract during the procedure and reduce the bloating. If you had a lower endoscopy (such as a colonoscopy or flexible sigmoidoscopy) you may notice spotting of blood in your stool or on the toilet paper. If you underwent a bowel prep for your procedure, you may not have a normal bowel movement for a few days.  Please Note:  You might notice some irritation and congestion in your nose or some drainage.  This is from the oxygen used during your procedure.  There is no need for concern and it should clear up in a day or so.  SYMPTOMS TO REPORT IMMEDIATELY:   Following lower endoscopy (colonoscopy or flexible sigmoidoscopy):  Excessive amounts of blood in the stool  Significant tenderness or worsening of abdominal pains  Swelling of the abdomen that is new, acute  Fever of 100F or higher  For urgent or emergent issues, a gastroenterologist can be reached at any hour by calling [phone]. Do not use MyChart messaging for urgent concerns.   DIET:  We do recommend a small meal at first, but then you may proceed to your regular diet.  Drink plenty of fluids but you should avoid alcoholic beverages for 24 hours.  ACTIVITY:  You should plan to take it  easy for the rest of today and you should NOT DRIVE or use heavy machinery until tomorrow (because of the sedation medicines used during the test).    FOLLOW UP: Our staff will call the number listed on your records 48-72 hours following your procedure to check on you and address any questions or concerns that you may have regarding the information given to you following your procedure. If we do not reach you, we will leave a message.  We will attempt to reach you two times.  During this call, we will ask if you have developed any symptoms of COVID 19. If you develop any symptoms (ie: fever, flu-like symptoms, shortness of breath, cough etc.) before then, please call [phone].  If you test positive for Covid 19 in the 2 weeks post procedure, please call and report this information to us.    If any biopsies were taken you will be contacted by phone or by letter within the next 1-3 weeks.  Please call us at [phone] if you have not heard about the biopsies in 3 weeks.   SIGNATURES/CONFIDENTIALITY: You and/or your care partner have signed paperwork which will be entered into your electronic medical record.  These signatures attest to the fact that that the information above on your After Visit Summary has been reviewed and is understood.  Full responsibility of the confidentiality of this discharge information lies with you and/or your care-partner. 

## 2019-09-12 NOTE — Progress Notes (Signed)
Called to room to assist during endoscopic procedure.  Patient ID and intended procedure confirmed with present staff. Received instructions for my participation in the procedure from the performing physician.  

## 2019-09-12 NOTE — Progress Notes (Signed)
Vitals- CW Temp- JB 

## 2019-09-12 NOTE — Op Note (Signed)
[place] Patient Name: Jenny Cohen Procedure Date: 09/12/2019 10:39 AM MRN: [id number] Endoscopist: Mallie Mussel L. Loletha Carrow , MD Age: 54 Referring MD:  Date of Birth: May 18, 1966 Gender: Female Account #: [id number] Procedure:                Colonoscopy Indications:              Surveillance: Personal history of adenomatous                            polyps on last colonoscopy 3 years ago (46mm TVA -                            fair prep on that exam) Medicines:                Monitored Anesthesia Care Procedure:                Pre-Anesthesia Assessment:                           - Prior to the procedure, a History and Physical                            was performed, and patient medications and                            allergies were reviewed. The patient's tolerance of                            previous anesthesia was also reviewed. The risks                            and benefits of the procedure and the sedation                            options and risks were discussed with the patient.                            All questions were answered, and informed consent                            was obtained. Prior Anticoagulants: The patient has                            taken no previous anticoagulant or antiplatelet                            agents. ASA Grade Assessment: III - A patient with                            severe systemic disease. After reviewing the risks                            and benefits, the patient was deemed in  satisfactory condition to undergo the procedure.                           After obtaining informed consent, the colonoscope                            was passed under direct vision. Throughout the                            procedure, the patient's blood pressure, pulse, and                            oxygen saturations were monitored continuously. The                            Colonoscope was introduced  through the anus and                            advanced to the the cecum, identified by                            appendiceal orifice and ileocecal valve. The                            colonoscopy was performed without difficulty. The                            patient tolerated the procedure well. The quality                            of the bowel preparation was good. The ileocecal                            valve, appendiceal orifice, and rectum were                            photographed. The bowel preparation used was 2 day                            Suprep/Miralax. Scope In: 10:51:28 AM Scope Out: 11:05:13 AM Scope Withdrawal Time: 0 hours 10 minutes 33 seconds  Total Procedure Duration: 0 hours 13 minutes 45 seconds  Findings:                 The perianal and digital rectal examinations were                            normal.                           An 8-10 mm polyp was found in the hepatic flexure.                            The polyp was sessile. The polyp was removed with a  cold snare. Resection and retrieval were complete.                           A diminutive polyp was found in the proximal                            descending colon. The polyp was sessile with a                            mucus cap. The polyp was removed with a cold snare.                            Resection and retrieval were complete.                           The exam was otherwise without abnormality on                            direct and retroflexion views. Complications:            No immediate complications. Estimated Blood Loss:     Estimated blood loss was minimal. Impression:               - One 8-10 mm polyp at the hepatic flexure, removed                            with a cold snare. Resected and retrieved.                           - One diminutive polyp in the proximal descending                            colon, removed with a cold snare. Resected and                             retrieved.                           - The examination was otherwise normal on direct                            and retroflexion views. Recommendation:           - Patient has a contact number available for                            emergencies. The signs and symptoms of potential                            delayed complications were discussed with the                            patient. Return to normal activities tomorrow.  Written discharge instructions were provided to the                            patient.                           - Resume previous diet.                           - Continue present medications.                           - Await pathology results.                           - Repeat colonoscopy is recommended for                            surveillance. The colonoscopy date will be                            determined after pathology results from today's                            exam become available for review. Jenny Cohen L. Loletha Carrow, MD 09/12/2019 11:14:08 AM This report has been signed electronically.

## 2019-09-12 NOTE — Progress Notes (Signed)
Report given to PACU, vss 

## 2019-09-16 ENCOUNTER — Encounter: Payer: Self-pay | Admitting: Gastroenterology

## 2019-09-16 ENCOUNTER — Telehealth: Payer: Self-pay | Admitting: *Deleted

## 2019-09-16 ENCOUNTER — Telehealth: Payer: Self-pay

## 2019-09-16 NOTE — Telephone Encounter (Signed)
No answer for post procedure call back. Left message and will attempt to call back this afternoon.

## 2019-09-16 NOTE — Telephone Encounter (Signed)
2nd follow up call made.  NALM 

## 2019-09-22 ENCOUNTER — Other Ambulatory Visit: Payer: Self-pay | Admitting: Family Medicine

## 2019-09-22 DIAGNOSIS — Z1231 Encounter for screening mammogram for malignant neoplasm of breast: Secondary | ICD-10-CM

## 2019-09-27 ENCOUNTER — Telehealth (INDEPENDENT_AMBULATORY_CARE_PROVIDER_SITE_OTHER): Payer: No Typology Code available for payment source | Admitting: Family Medicine

## 2019-09-27 ENCOUNTER — Other Ambulatory Visit: Payer: Self-pay

## 2019-09-27 DIAGNOSIS — R05 Cough: Secondary | ICD-10-CM

## 2019-09-27 DIAGNOSIS — R059 Cough, unspecified: Secondary | ICD-10-CM

## 2019-09-27 MED ORDER — HYDROCODONE-HOMATROPINE 5-1.5 MG/5ML PO SYRP: 5.0000 mL | ORAL_SOLUTION | Freq: Three times a day (TID) | ORAL | 0 refills | Status: DC | PRN

## 2019-09-27 MED ORDER — FLUCONAZOLE 150 MG PO TABS: 150.0000 mg | ORAL_TABLET | ORAL | 0 refills | Status: DC

## 2019-09-27 MED ORDER — CEFDINIR 300 MG PO CAPS: 300.0000 mg | ORAL_CAPSULE | Freq: Two times a day (BID) | ORAL | 0 refills | Status: AC

## 2019-09-28 DIAGNOSIS — R059 Cough, unspecified: Secondary | ICD-10-CM | POA: Insufficient documentation

## 2019-09-28 DIAGNOSIS — R05 Cough: Secondary | ICD-10-CM | POA: Insufficient documentation

## 2019-09-28 NOTE — Assessment & Plan Note (Signed)
Patient has previously had COVID and the symptoms she has developed this week. She had some left over Hydromet and that has helped her manage the cough at night but she is struggling with cough, headahce, fatigue, myalgias, congestion with production of green phlegm, chilss and nausea. She has an appointment to a rapid covid test tomorrow. She is given a refill on Hydromet to use prn and then also given a prescription for Cefdinir to start if her COVID test is negative and also given Diflucan to use as needed as she frequently gets a yeast infection after antibiotics Take Multivitamin with minerals, selenium Vitamin D 1000-2000 IU daily Probiotic with lactobacillus and bifidophilus Asprin EC 81 mg daily Fish oil daily

## 2019-09-28 NOTE — Progress Notes (Signed)
Virtual Visit via Video Note  I connected with Jenny Cohen on 09/27/2019 at  9:20 AM EDT by a video enabled telemedicine application and verified that I am speaking with the correct person using two identifiers.  Location: Patient: home Provider: office   I discussed the limitations of evaluation and management by telemedicine and the availability of in person appointments. The patient expressed understanding and agreed to proceed. Jenny Cohen, CMA was able to get the patient set up on a video visit    Subjective:    Patient ID: Jenny Cohen, female    DOB: 1966-05-07, 54 y.o.   MRN: [id number]  Chief Complaint  Patient presents with  . Cough    coughing up green phlegm and sweats    HPI Patient is in today for evaluation of a cough. Patient has previously had COVID and the symptoms she has developed this week. She had some left over Hydromet and that has helped her manage the cough at night but she is struggling with cough, headahce, fatigue, myalgias, congestion with production of green phlegm, chilss and nausea. She has an appointment to a rapid covid test tomorrow. Denies CP/palp/SOB/HA/fevers or GU c/o. Taking meds as prescribed  Past Medical History:  Diagnosis Date  . Allergy   . Anemia   . Arthritis    LEFT SHOULDER  . Chicken pox   . DUB (dysfunctional uterine bleeding)   . Fibroids   . Heart murmur    as a child  . Hyperlipidemia   . Morbid obesity ([place])    BMI 47    Past Surgical History:  Procedure Laterality Date  . ABDOMINAL HYSTERECTOMY    . CESAREAN SECTION    . DILATION AND CURETTAGE OF UTERUS    . ENDOMETRIAL ABLATION    . LAPAROSCOPY  1990   with laparotomy  . PARTIAL HYSTERECTOMY      Family History  Problem Relation Age of Onset  . Hyperlipidemia Father   . Hypertension Father   . Syncope episode Father   . Diabetes Mother   . Sarcoidosis Mother   . Pancreatic cancer Maternal Uncle   . Colon cancer Maternal Grandfather     . Alzheimer's disease Maternal Grandfather   . Alzheimer's disease Maternal Grandmother   . Heart failure Paternal Grandmother   . Throat cancer Paternal Grandfather   . Esophageal cancer Neg Hx   . Rectal cancer Neg Hx   . Stomach cancer Neg Hx     Social History   Socioeconomic History  . Marital status: Single    Spouse name: Not on file  . Number of children: Not on file  . Years of education: Not on file  . 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 Smoking status: Never Smoker  . Smokeless tobacco: Never Used  Substance and Sexual Activity  . Alcohol use: Yes    Alcohol/week: 0.0 standard drinks    Comment: social  . Drug use: No  . Sexual activity: Yes    Partners: Male    Birth control/protection: Condom  Other Topics Concern  . Not on file  Social History Narrative   Exercise--= daily 45 min and zumba 3 days a week   Social Determinants of Health   Financial Resource Strain:   . Difficulty of Paying Living Expenses:   Food Insecurity:   . Worried About Charity fundraiser in the  Last Year:   . [place] in the Last Year:   Transportation Needs:   . Film/video editor (Medical):   Marland Kitchen Lack of Transportation (Non-Medical):   Physical Activity:   . Days of Exercise per Week:   . Minutes of Exercise per Session:   Stress:   . Feeling of Stress :   Social Connections:   . Frequency of Communication with Friends and Family:   . Frequency of Social Gatherings with Friends and Family:   . Attends Religious Services:   . Active Member of Clubs or Organizations:   . Attends Archivist Meetings:   Marland Kitchen Marital Status:   Intimate Partner Violence:   . Fear of Current or Ex-Partner:   . Emotionally Abused:   Marland Kitchen Physically Abused:   . Sexually Abused:     Outpatient Medications Prior to Visit  Medication Sig Dispense Refill  . ALPRAZolam (XANAX) 0.25 MG tablet  Take 1 tablet (0.25 mg total) by mouth 3 (three) times daily as needed for anxiety. 30 tablet 0  . ibuprofen (ADVIL) 800 MG tablet Take 1 tablet (800 mg total) by mouth every 8 (eight) hours as needed. (Patient not taking: Reported on 04/11/2019) 21 tablet 0   Facility-Administered Medications Prior to Visit  Medication Dose Route Frequency Provider Last Rate Last Admin  . 0.9 %  sodium chloride infusion  500 mL Intravenous Once Nelida Meuse III, MD      . 0.9 %  sodium chloride infusion  500 mL Intravenous Once Doran Stabler, MD        Allergies  Allergen Reactions  . Miconazole Nitrate Itching  . Vagisil Itching    Review of Systems  Constitutional: Positive for chills and malaise/fatigue. Negative for fever.  HENT: Positive for congestion.   Eyes: Negative for blurred vision.  Respiratory: Positive for cough and sputum production. Negative for shortness of breath.   Cardiovascular: Negative for chest pain, palpitations and leg swelling.  Gastrointestinal: Positive for nausea. Negative for abdominal pain, blood in stool and vomiting.  Genitourinary: Negative for dysuria and frequency.  Musculoskeletal: Positive for myalgias. Negative for falls.  Skin: Negative for rash.  Neurological: Negative for dizziness, loss of consciousness and headaches.  Endo/Heme/Allergies: Negative for environmental allergies.  Psychiatric/Behavioral: Negative for depression. The patient is not nervous/anxious.        Objective:    Physical Exam  LMP 07/14/2013  Wt Readings from Last 3 Encounters:  09/12/19 (!) 300 lb 12.8 oz (136.4 kg)  09/01/19 (!) 300 lb 12.8 oz (136.4 kg)  06/04/19 290 lb 3.2 oz (131.6 kg)    Diabetic Foot Exam - Simple   No data filed     Lab Results  Component Value Date   WBC 3.1 (L) 06/04/2019   HGB 14.7 06/04/2019   HCT 47.8 (H) 06/04/2019   PLT 374 06/04/2019   GLUCOSE 152 (H) 06/04/2019   CHOL 183 12/02/2018   TRIG 112.0 12/02/2018   HDL 43.80  12/02/2018   LDLCALC 117 (H) 12/02/2018   ALT 18 06/04/2019   AST 22 06/04/2019   NA 137 06/04/2019   K 3.0 (L) 06/04/2019   CL 98 06/04/2019   CREATININE 0.74 06/04/2019   BUN 11 06/04/2019   CO2 29 06/04/2019   TSH 2.46 07/09/2017   HGBA1C 5.8 (H) 07/30/2013    Lab Results  Component Value Date   TSH 2.46 07/09/2017   Lab Results  Component Value Date  WBC 3.1 (L) 06/04/2019   HGB 14.7 06/04/2019   HCT 47.8 (H) 06/04/2019   MCV 89.5 06/04/2019   PLT 374 06/04/2019   Lab Results  Component Value Date   NA 137 06/04/2019   K 3.0 (L) 06/04/2019   CO2 29 06/04/2019   GLUCOSE 152 (H) 06/04/2019   BUN 11 06/04/2019   CREATININE 0.74 06/04/2019   BILITOT 0.4 06/04/2019   ALKPHOS 98 06/04/2019   AST 22 06/04/2019   ALT 18 06/04/2019   PROT 7.9 06/04/2019   ALBUMIN 3.7 06/04/2019   CALCIUM 9.1 06/04/2019   ANIONGAP 10 06/04/2019   GFR 105.62 12/02/2018   Lab Results  Component Value Date   CHOL 183 12/02/2018   Lab Results  Component Value Date   HDL 43.80 12/02/2018   Lab Results  Component Value Date   LDLCALC 117 (H) 12/02/2018   Lab Results  Component Value Date   TRIG 112.0 12/02/2018   Lab Results  Component Value Date   CHOLHDL 4 12/02/2018   Lab Results  Component Value Date   HGBA1C 5.8 (H) 07/30/2013       Assessment & Plan:   Problem List Items Addressed This Visit    Cough    Patient has previously had COVID and the symptoms she has developed this week. She had some left over Hydromet and that has helped her manage the cough at night but she is struggling with cough, headahce, fatigue, myalgias, congestion with production of green phlegm, chilss and nausea. She has an appointment to a rapid covid test tomorrow. She is given a refill on Hydromet to use prn and then also given a prescription for Cefdinir to start if her COVID test is negative and also given Diflucan to use as needed as she frequently gets a yeast infection after  antibiotics Take Multivitamin with minerals, selenium Vitamin D 1000-2000 IU daily Probiotic with lactobacillus and bifidophilus Asprin EC 81 mg daily Fish oil daily          I have discontinued Ellah J. Miller's ibuprofen. I am also having her start on cefdinir, fluconazole, and HYDROcodone-homatropine. Additionally, I am having her maintain her ALPRAZolam. We will continue to administer sodium chloride and sodium chloride.  Meds ordered this encounter  Medications  . cefdinir (OMNICEF) 300 MG capsule    Sig: Take 1 capsule (300 mg total) by mouth 2 (two) times daily for 10 days.    Dispense:  20 capsule    Refill:  0  . fluconazole (DIFLUCAN) 150 MG tablet    Sig: Take 1 tablet (150 mg total) by mouth once a week.    Dispense:  2 tablet    Refill:  0  . HYDROcodone-homatropine (HYCODAN) 5-1.5 MG/5ML syrup    Sig: Take 5 mLs by mouth every 8 (eight) hours as needed for cough.    Dispense:  180 mL    Refill:  0      I discussed the assessment and treatment plan with the patient. The patient was provided an opportunity to ask questions and all were answered. The patient agreed with the plan and demonstrated an understanding of the instructions.   The patient was advised to call back or seek an in-person evaluation if the symptoms worsen or if the condition fails to improve as anticipated.  I provided 15 minutes of non-face-to-face time during this encounter.   Penni Homans, MD

## 2019-10-10 ENCOUNTER — Other Ambulatory Visit: Payer: Self-pay | Admitting: Family Medicine

## 2019-10-29 ENCOUNTER — Ambulatory Visit: Payer: No Typology Code available for payment source

## 2019-11-18 ENCOUNTER — Ambulatory Visit: Payer: No Typology Code available for payment source | Admitting: Family

## 2019-11-20 ENCOUNTER — Other Ambulatory Visit: Payer: Self-pay

## 2019-11-20 ENCOUNTER — Encounter: Payer: Self-pay | Admitting: Family Medicine

## 2019-11-20 ENCOUNTER — Ambulatory Visit (INDEPENDENT_AMBULATORY_CARE_PROVIDER_SITE_OTHER): Payer: Self-pay | Admitting: Family Medicine

## 2019-11-20 VITALS — BP 121/63 | HR 89 | Temp 97.1°F | Resp 16 | Ht 65.0 in | Wt 294.0 lb

## 2019-11-20 DIAGNOSIS — N764 Abscess of vulva: Secondary | ICD-10-CM

## 2019-11-20 MED ORDER — FLUCONAZOLE 150 MG PO TABS: ORAL_TABLET | ORAL | 0 refills | Status: DC

## 2019-11-20 MED ORDER — DOXYCYCLINE HYCLATE 100 MG PO TABS: 100.0000 mg | ORAL_TABLET | Freq: Two times a day (BID) | ORAL | 0 refills | Status: DC

## 2019-11-20 NOTE — Progress Notes (Signed)
Patient ID: Jenny Cohen, female    DOB: 24-Apr-1966  Age: 54 y.o. MRN: [id number]    Subjective:  Subjective  HPI Jenny Cohen presents for c/o abscess in vulvar area ----- did improve and no longer bothering her but is still present    Review of Systems  Constitutional: Negative for appetite change, diaphoresis, fatigue and unexpected weight change.  Eyes: Negative for pain, redness and visual disturbance.  Respiratory: Negative for cough, chest tightness, shortness of breath and wheezing.   Cardiovascular: Negative for chest pain, palpitations and leg swelling.  Endocrine: Negative for cold intolerance, heat intolerance, polydipsia, polyphagia and polyuria.  Genitourinary: Negative for difficulty urinating, dysuria and frequency.  Neurological: Negative for dizziness, light-headedness, numbness and headaches.    History Past Medical History:  Diagnosis Date  . Allergy   . Anemia   . Arthritis    LEFT SHOULDER  . Chicken pox   . DUB (dysfunctional uterine bleeding)   . Fibroids   . Heart murmur    as a child  . Hyperlipidemia   . Morbid obesity ([place])    BMI 47    She has a past surgical history that includes Cesarean section; Endometrial ablation; laparoscopy (1990); Partial hysterectomy; Dilation and curettage of uterus; and Abdominal hysterectomy.   Her family history includes Alzheimer's disease in her maternal grandfather and maternal grandmother; Colon cancer in her maternal grandfather; Diabetes in her mother; Heart failure in her paternal grandmother; Hyperlipidemia in her father; Hypertension in her father; Pancreatic cancer in her maternal uncle; Sarcoidosis in her mother; Syncope episode in her father; Throat cancer in her paternal grandfather.She reports that she has never smoked. She has never used smokeless tobacco. She reports current alcohol use. She reports that she does not use drugs.  Current Outpatient Medications on File Prior to Visit  Medication  Sig Dispense Refill  . ALPRAZolam (XANAX) 0.25 MG tablet Take 1 tablet (0.25 mg total) by mouth 3 (three) times daily as needed for anxiety. 30 tablet 0   Current Facility-Administered Medications on File Prior to Visit  Medication Dose Route Frequency Provider Last Rate Last Admin  . 0.9 %  sodium chloride infusion  500 mL Intravenous Once Nelida Meuse III, MD      . 0.9 %  sodium chloride infusion  500 mL Intravenous Once Danis, Kirke Corin, MD         Objective:  Objective  Physical Exam Vitals and nursing note reviewed.  Constitutional:      Appearance: She is well-developed.  HENT:     Head: Normocephalic and atraumatic.  Eyes:     Conjunctiva/sclera: Conjunctivae normal.  Neck:     Thyroid: No thyromegaly.     Vascular: No carotid bruit or JVD.  Cardiovascular:     Rate and Rhythm: Normal rate and regular rhythm.     Heart sounds: Normal heart sounds. No murmur heard.   Pulmonary:     Effort: Pulmonary effort is normal. No respiratory distress.     Breath sounds: Normal breath sounds. No wheezing or rales.  Chest:     Chest wall: No tenderness.  Genitourinary:   Musculoskeletal:     Cervical back: Normal range of motion and neck supple.  Neurological:     Mental Status: She is alert and oriented to person, place, and time.    BP 121/63 (BP Location: Left Arm, Patient Position: Sitting, Cuff Size: Large)   Pulse 89   Temp (!) 97.1 F (36.2  C) (Temporal)   Resp 16   Ht 5\' 5"  (1.651 m)   Wt 294 lb (133.4 kg)   LMP 07/14/2013   SpO2 99%   BMI 48.92 kg/m  Wt Readings from Last 3 Encounters:  11/20/19 294 lb (133.4 kg)  09/12/19 (!) 300 lb 12.8 oz (136.4 kg)  09/01/19 (!) 300 lb 12.8 oz (136.4 kg)     Lab Results  Component Value Date   WBC 3.1 (L) 06/04/2019   HGB 14.7 06/04/2019   HCT 47.8 (H) 06/04/2019   PLT 374 06/04/2019   GLUCOSE 152 (H) 06/04/2019   CHOL 183 12/02/2018   TRIG 112.0 12/02/2018   HDL 43.80 12/02/2018   LDLCALC 117 (H)  12/02/2018   ALT 18 06/04/2019   AST 22 06/04/2019   NA 137 06/04/2019   K 3.0 (L) 06/04/2019   CL 98 06/04/2019   CREATININE 0.74 06/04/2019   BUN 11 06/04/2019   CO2 29 06/04/2019   TSH 2.46 07/09/2017   HGBA1C 5.8 (H) 07/30/2013    CT ABDOMEN PELVIS W CONTRAST  Result Date: 08/27/2019 CLINICAL DATA:  54 year old female with history of right lower quadrant abdominal pain. EXAM: CT ABDOMEN AND PELVIS WITH CONTRAST TECHNIQUE: Multidetector CT imaging of the abdomen and pelvis was performed using the standard protocol following bolus administration of intravenous contrast. CONTRAST:  153mL ISOVUE-300 IOPAMIDOL (ISOVUE-300) INJECTION 61% COMPARISON:  CT the abdomen and pelvis 04/24/2016. FINDINGS: Lower chest: Linear areas of scarring or atelectasis in the right middle lobe and inferior segment of the lingula. Hepatobiliary: Low-attenuation lesions in the liver compatible with simple cysts, largest of which is in the caudate lobe measuring 3.2 x 2.5 cm (axial image 26 of series 2). No other suspicious appearing hepatic lesions are noted. No intra or extrahepatic biliary ductal dilatation. Gallbladder is normal in appearance. Pancreas: No pancreatic mass. No pancreatic ductal dilatation. No pancreatic or peripancreatic fluid collections or inflammatory changes. Spleen: Unremarkable. Adrenals/Urinary Tract: Bilateral kidneys and adrenal glands are normal in appearance. No hydroureteronephrosis. Urinary bladder is normal in appearance. Stomach/Bowel: Normal appearance of the stomach. No pathologic dilatation of small bowel or colon. Normal appendix. Vascular/Lymphatic: Atherosclerotic calcifications in the pelvic vasculature. No aneurysm or dissection noted in the abdomen or pelvis. No lymphadenopathy noted in the abdomen or pelvis. Reproductive: Status post hysterectomy. Left ovary is not confidently identified may be surgically absent or atrophic. Right ovary is unremarkable in appearance. Other: No  significant volume of ascites.  No pneumoperitoneum. Musculoskeletal: There are no aggressive appearing lytic or blastic lesions noted in the visualized portions of the skeleton. IMPRESSION: 1. No acute findings are noted in the abdomen or pelvis to account for the patient's symptoms. Specifically, the appendix is normal. 2. 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 3. Additional incidental findings, as above. Electronically Signed   By: Vinnie Langton M.D.   On: 08/27/2019 10:25     Assessment & Plan:  Plan  I have discontinued Kaylon [place]. [place]'s fluconazole and HYDROcodone-homatropine. I am also having her start on doxycycline and fluconazole. Additionally, I am having her maintain her ALPRAZolam. We will continue to administer sodium chloride and sodium chloride.  Meds ordered this encounter  Medications  . doxycycline (VIBRA-TABS) 100 MG tablet    Sig: Take 1 tablet (100 mg total) by mouth 2 (two) times daily.    Dispense:  20 tablet    Refill:  0  . fluconazole (DIFLUCAN) 150 MG tablet    Sig: 1 po x1, may repeat in  3 days prn    Dispense:  2 tablet    Refill:  0    Problem List Items Addressed This Visit    None    Visit Diagnoses    Vulvar abscess    -  Primary   Relevant Medications   doxycycline (VIBRA-TABS) 100 MG tablet   fluconazole (DIFLUCAN) 150 MG tablet    con't sitz baths  abx per orders   Follow-up: Return if symptoms worsen or fail to improve.  Ann Held, DO

## 2019-11-20 NOTE — Patient Instructions (Signed)
Skin Abscess  A skin abscess is an infected area on or under your skin that contains a collection of pus and other material. An abscess may also be called a furuncle, carbuncle, or boil. An abscess can occur in or on almost any part of your body. Some abscesses break open (rupture) on their own. Most continue to get worse unless they are treated. The infection can spread deeper into the body and eventually into your blood, which can make you feel ill. Treatment usually involves draining the abscess. What are the causes? An abscess occurs when germs, like bacteria, pass through your skin and cause an infection. This may be caused by:  A scrape or cut on your skin.  A puncture wound through your skin, including a needle injection or insect bite.  Blocked oil or sweat glands.  Blocked and infected hair follicles.  A cyst that forms beneath your skin (sebaceous cyst) and becomes infected. What increases the risk? This condition is more likely to develop in people who:  Have a weak body defense system (immune system).  Have diabetes.  Have dry and irritated skin.  Get frequent injections or use illegal IV drugs.  Have a foreign body in a wound, such as a splinter.  Have problems with their lymph system or veins. What are the signs or symptoms? Symptoms of this condition include:  A painful, firm bump under the skin.  A bump with pus at the top. This may break through the skin and drain. Other symptoms include:  Redness surrounding the abscess site.  Warmth.  Swelling of the lymph nodes (glands) near the abscess.  Tenderness.  A sore on the skin. How is this diagnosed? This condition may be diagnosed based on:  A physical exam.  Your medical history.  A sample of pus. This may be used to find out what is causing the infection.  Blood tests.  Imaging tests, such as an ultrasound, CT scan, or MRI. How is this treated? A small abscess that drains on its own may  not need treatment. Treatment for larger abscesses may include:  Moist heat or heat pack applied to the area several times a day.  A procedure to drain the abscess (incision and drainage).  Antibiotic medicines. For a severe abscess, you may first get antibiotics through an IV and then change to antibiotics by mouth. Follow these instructions at home: Medicines   Take over-the-counter and prescription medicines only as told by your health care provider.  If you were prescribed an antibiotic medicine, take it as told by your health care provider. Do not stop taking the antibiotic even if you start to feel better. Abscess care   If you have an abscess that has not drained, apply heat to the affected area. Use the heat source that your health care provider recommends, such as a moist heat pack or a heating pad. ? Place a towel between your skin and the heat source. ? Leave the heat on for 20-30 minutes. ? Remove the heat if your skin turns bright red. This is especially important if you are unable to feel pain, heat, or cold. You may have a greater risk of getting burned.  Follow instructions from your health care provider about how to take care of your abscess. Make sure you: ? Cover the abscess with a bandage (dressing). ? Change your dressing or gauze as told by your health care provider. ? Wash your hands with soap and water before you change the   dressing or gauze. If soap and water are not available, use hand sanitizer.  Check your abscess every day for signs of a worsening infection. Check for: ? More redness, swelling, or pain. ? More fluid or blood. ? Warmth. ? More pus or a bad smell. General instructions  To avoid spreading the infection: ? Do not share personal care items, towels, or hot tubs with others. ? Avoid making skin contact with other people.  Keep all follow-up visits as told by your health care provider. This is important. Contact a health care provider if  you have:  More redness, swelling, or pain around your abscess.  More fluid or blood coming from your abscess.  Warm skin around your abscess.  More pus or a bad smell coming from your abscess.  A fever.  Muscle aches.  Chills or a general ill feeling. Get help right away if you:  Have severe pain.  See red streaks on your skin spreading away from the abscess. Summary  A skin abscess is an infected area on or under your skin that contains a collection of pus and other material.  A small abscess that drains on its own may not need treatment.  Treatment for larger abscesses may include having a procedure to drain the abscess and taking an antibiotic. This information is not intended to replace advice given to you by your health care provider. Make sure you discuss any questions you have with your health care provider. Document Revised: 09/05/2018 Document Reviewed: 06/28/2017 Elsevier Patient Education  2020 [employer]  

## 2019-11-26 ENCOUNTER — Ambulatory Visit (HOSPITAL_BASED_OUTPATIENT_CLINIC_OR_DEPARTMENT_OTHER)
Admission: RE | Admit: 2019-11-26 | Discharge: 2019-11-26 | Disposition: A | Discharge status: Home / Self Care | Payer: Self-pay | Source: Ambulatory Visit | Attending: Family Medicine | Admitting: Family Medicine

## 2019-11-26 ENCOUNTER — Other Ambulatory Visit: Payer: Self-pay

## 2019-11-26 DIAGNOSIS — Z1231 Encounter for screening mammogram for malignant neoplasm of breast: Secondary | ICD-10-CM | POA: Insufficient documentation

## 2019-12-22 ENCOUNTER — Other Ambulatory Visit: Payer: Self-pay | Admitting: Family Medicine

## 2019-12-22 DIAGNOSIS — F419 Anxiety disorder, unspecified: Secondary | ICD-10-CM

## 2019-12-22 NOTE — Telephone Encounter (Signed)
Last office visit- 11-20-2019 Last refill- 02-14-2019 Next office visit-02-17-2020

## 2020-02-17 ENCOUNTER — Telehealth: Payer: Self-pay | Admitting: Physician Assistant

## 2020-02-17 ENCOUNTER — Encounter: Payer: Self-pay | Admitting: Family Medicine

## 2020-02-17 DIAGNOSIS — J329 Chronic sinusitis, unspecified: Secondary | ICD-10-CM

## 2020-02-17 MED ORDER — FLUTICASONE PROPIONATE 50 MCG/ACT NA SUSP
2.0000 | Freq: Every day | NASAL | 6 refills | Status: DC
Start: 2020-02-17 — End: 2021-03-28

## 2020-02-17 NOTE — Progress Notes (Signed)
We are sorry that you are not feeling well.  Here is how we plan to help!  Based on what you have shared with me it looks like you have sinusitis.  Sinusitis is inflammation and infection in the sinus cavities of the head.  Based on your presentation I believe you most likely have Acute Viral Sinusitis.This is an infection most likely caused by a virus. There is not specific treatment for viral sinusitis other than to help you with the symptoms until the infection runs its course.  You may use an oral decongestant such as Mucinex D or if you have glaucoma or high blood pressure use plain Mucinex. Saline nasal spray help and can safely be used as often as needed for congestion, I have prescribed: Fluticasone nasal spray two sprays in each nostril once a day  Some authorities believe that zinc sprays or the use of Echinacea may shorten the course of your symptoms.  Sinus infections are not as easily transmitted as other respiratory infection, however we still recommend that you avoid close contact with loved ones, especially the very young and elderly.  Remember to wash your hands thoroughly throughout the day as this is the number one way to prevent the spread of infection!  Home Care:  Only take medications as instructed by your medical team.  Do not take these medications with alcohol.  A steam or ultrasonic humidifier can help congestion.  You can place a towel over your head and breathe in the steam from hot water coming from a faucet.  Avoid close contacts especially the very young and the elderly.  Cover your mouth when you cough or sneeze.  Always remember to wash your hands.  Get Help Right Away If:  You develop worsening fever or sinus pain.  You develop a severe head ache or visual changes.  Your symptoms persist after you have completed your treatment plan.  Make sure you  Understand these instructions.  Will watch your condition.  Will get help right away if you are not  doing well or get worse.  Your e-visit answers were reviewed by a board certified advanced clinical practitioner to complete your personal care plan.  Depending on the condition, your plan could have included both over the counter or prescription medications.  If there is a problem please reply  once you have received a response from your provider.  Your safety is important to [place].  If you have drug allergies check your prescription carefully.    You can use MyChart to ask questions about today's visit, request a non-urgent call back, or ask for a work or school excuse for 24 hours related to this e-Visit. If it has been greater than 24 hours you will need to follow up with your provider, or enter a new e-Visit to address those concerns.  You will get an e-mail in the next two days asking about your experience.  I hope that your e-visit has been valuable and will speed your recovery. Thank you for using e-visits.   5 minutes spent on charting

## 2020-03-18 ENCOUNTER — Other Ambulatory Visit: Payer: Self-pay

## 2020-03-18 ENCOUNTER — Encounter (HOSPITAL_COMMUNITY): Payer: Self-pay

## 2020-03-18 DIAGNOSIS — R109 Unspecified abdominal pain: Secondary | ICD-10-CM | POA: Insufficient documentation

## 2020-03-18 LAB — COMPREHENSIVE METABOLIC PANEL
ALT: 13 U/L (ref 0–44)
AST: 14 U/L — ABNORMAL LOW (ref 15–41)
Albumin: 3.5 g/dL (ref 3.5–5.0)
Alkaline Phosphatase: 105 U/L (ref 38–126)
Anion gap: 8 (ref 5–15)
BUN: 13 mg/dL (ref 6–20)
CO2: 26 mmol/L (ref 22–32)
Calcium: 8.8 mg/dL — ABNORMAL LOW (ref 8.9–10.3)
Chloride: 104 mmol/L (ref 98–111)
Creatinine, Ser: 0.85 mg/dL (ref 0.44–1.00)
GFR, Estimated: >60 mL/min (ref >60)
Glucose, Bld: 156 mg/dL — ABNORMAL HIGH (ref 70–99)
Potassium: 3.6 mmol/L (ref 3.5–5.1)
Sodium: 138 mmol/L (ref 135–145)
Total Bilirubin: 0.4 mg/dL (ref 0.3–1.2)
Total Protein: 7.7 g/dL (ref 6.5–8.1)

## 2020-03-18 LAB — CBC
HCT: 42.2 % (ref 36.0–46.0)
Hemoglobin: 13.5 g/dL (ref 12.0–15.0)
MCH: 28.7 pg (ref 26.0–34.0)
MCHC: 32 g/dL (ref 30.0–36.0)
MCV: 89.6 fL (ref 80.0–100.0)
Platelets: 472 10*3/uL — ABNORMAL HIGH (ref 150–400)
RBC: 4.71 MIL/uL (ref 3.87–5.11)
RDW: 13.8 % (ref 11.5–15.5)
WBC: 7.3 10*3/uL (ref 4.0–10.5)
nRBC: 0 % (ref 0.0–0.2)

## 2020-03-18 LAB — URINALYSIS, ROUTINE W REFLEX MICROSCOPIC
Bilirubin Urine: NEGATIVE
Glucose, UA: NEGATIVE mg/dL
Hgb urine dipstick: NEGATIVE
Ketones, ur: 5 mg/dL — AB
Leukocytes,Ua: NEGATIVE
Nitrite: NEGATIVE
Protein, ur: NEGATIVE mg/dL
Specific Gravity, Urine: 1.026 (ref 1.005–1.030)
pH: 5 (ref 5.0–8.0)

## 2020-03-18 LAB — LIPASE, BLOOD: Lipase: 25 U/L (ref 11–51)

## 2020-03-18 NOTE — ED Triage Notes (Signed)
Patient arrived stating that over the last week she has had left sided flank pain. Also endorses some nausea.

## 2020-03-19 ENCOUNTER — Encounter (HOSPITAL_COMMUNITY): Payer: Self-pay | Admitting: Emergency Medicine

## 2020-03-19 ENCOUNTER — Emergency Department (HOSPITAL_COMMUNITY): Payer: Self-pay

## 2020-03-19 ENCOUNTER — Emergency Department (HOSPITAL_COMMUNITY)
Admission: EM | Admit: 2020-03-19 | Discharge: 2020-03-19 | Disposition: A | Discharge status: Home / Self Care | Payer: Self-pay | Attending: Emergency Medicine | Admitting: Emergency Medicine

## 2020-03-19 DIAGNOSIS — R109 Unspecified abdominal pain: Secondary | ICD-10-CM

## 2020-03-19 MED ORDER — LIDOCAINE 5 % EX PTCH: 1.0000 | MEDICATED_PATCH | CUTANEOUS | 0 refills | Status: DC

## 2020-03-19 MED ORDER — MELOXICAM 15 MG PO TABS
15.0000 mg | ORAL_TABLET | Freq: Every day | ORAL | 0 refills | Status: DC
Start: 2020-03-19 — End: 2021-03-28

## 2020-03-19 MED ORDER — LIDOCAINE 5 % EX PTCH
1.0000 | MEDICATED_PATCH | CUTANEOUS | Status: DC
  Administered 2020-03-19: 1 via TRANSDERMAL
  Filled 2020-03-19: qty 1

## 2020-03-19 MED ORDER — KETOROLAC TROMETHAMINE 60 MG/2ML IM SOLN
60.0000 mg | Freq: Once | INTRAMUSCULAR | Status: AC
  Administered 2020-03-19: 60 mg via INTRAMUSCULAR
  Filled 2020-03-19: qty 2

## 2020-03-19 NOTE — ED Provider Notes (Signed)
[facility] DEPT Provider Note   CSN: [id number] Arrival date & time: 03/18/20  2011     History Chief Complaint  Patient presents with  . Flank Pain    Jenny Cohen is a 54 y.o. female.  The history is provided by the patient.  Flank Pain This is a new problem. The current episode started more than 1 week ago. The problem occurs constantly. The problem has not changed since onset.Pertinent negatives include no chest pain, no abdominal pain, no headaches and no shortness of breath. Nothing aggravates the symptoms. Nothing relieves the symptoms. She has tried nothing for the symptoms. The treatment provided no relief.  Patient with one week of left flank pain.  No dysuria no hematuria.  No vomiting no diarrhea.  No f/c/r.       Past Medical History:  Diagnosis Date  . Allergy   . Anemia   . Arthritis    LEFT SHOULDER  . Chicken pox   . DUB (dysfunctional uterine bleeding)   . Fibroids   . Heart murmur    as a child  . Hyperlipidemia   . Morbid obesity ([place])    BMI 47    Patient Active Problem List   Diagnosis Date Noted  . Cough 09/28/2019  . Anxiety 02/13/2017    Past Surgical History:  Procedure Laterality Date  . ABDOMINAL HYSTERECTOMY    . CESAREAN SECTION    . DILATION AND CURETTAGE OF UTERUS    . ENDOMETRIAL ABLATION    . LAPAROSCOPY  1990   with laparotomy  . PARTIAL HYSTERECTOMY       OB History    Gravida  1   Para  1   Term  1   Preterm      AB      Living        SAB      TAB      Ectopic      Multiple      Live Births              Family History  Problem Relation Age of Onset  . Hyperlipidemia Father   . Hypertension Father   . Syncope episode Father   . Diabetes Mother   . Sarcoidosis Mother   . Pancreatic cancer Maternal Uncle   . Colon cancer Maternal Grandfather   . Alzheimer's disease Maternal Grandfather   . Alzheimer's disease Maternal Grandmother   . Heart failure  Paternal Grandmother   . Throat cancer Paternal Grandfather   . Esophageal cancer Neg Hx   . Rectal cancer Neg Hx   . Stomach cancer Neg Hx     Social History   Tobacco Use  . Smoking status: Never Smoker  . Smokeless tobacco: Never Used  Vaping Use  . Vaping Use: Never used  Substance Use Topics  . Alcohol use: Yes    Alcohol/week: 0.0 standard drinks    Comment: social  . Drug use: No    Home Medications Prior to Admission medications   Medication Sig Start Date End Date Taking? Authorizing Provider  ALPRAZolam (XANAX) 0.25 MG tablet TAKE 1 TABLET(0.25 MG) BY MOUTH THREE TIMES DAILY AS NEEDED FOR ANXIETY Patient taking differently: Take 0.25 mg by mouth 3 (three) times daily as needed for anxiety.  12/22/19  Yes Ann Held, DO  doxycycline (VIBRA-TABS) 100 MG tablet Take 1 tablet (100 mg total) by mouth 2 (two) times daily. 11/20/19   Lowne  Koren Shiver, DO  fluconazole (DIFLUCAN) 150 MG tablet 1 po x1, may repeat in 3 days prn 11/20/19   Carollee Herter, Alferd Apa, DO  fluticasone (FLONASE) 50 MCG/ACT nasal spray Place 2 sprays into both nostrils daily. 02/17/20   Alveria Apley, PA-C    Allergies    Miconazole nitrate and Vagisil  Review of Systems   Review of Systems  Constitutional: Negative for fever.  HENT: Negative for congestion.   Eyes: Negative for visual disturbance.  Respiratory: Negative for shortness of breath.   Cardiovascular: Negative for chest pain.  Gastrointestinal: Negative for abdominal pain.  Genitourinary: Positive for flank pain.  Musculoskeletal: Negative for arthralgias.  Skin: Negative for rash.  Neurological: Negative for headaches.  Psychiatric/Behavioral: Negative for agitation.  All other systems reviewed and are negative.   Physical Exam Updated Vital Signs BP 118/62   Pulse 78   Temp 98.6 F (37 C) (Oral)   Resp 18   LMP 07/14/2013   SpO2 98%   Physical Exam Vitals and nursing note reviewed.  Constitutional:       General: She is not in acute distress.    Appearance: Normal appearance.  HENT:     Head: Normocephalic and atraumatic.     Nose: Nose normal.  Eyes:     Conjunctiva/sclera: Conjunctivae normal.     Pupils: Pupils are equal, round, and reactive to light.  Cardiovascular:     Rate and Rhythm: Normal rate and regular rhythm.     Pulses: Normal pulses.     Heart sounds: Normal heart sounds.  Pulmonary:     Effort: Pulmonary effort is normal.     Breath sounds: Normal breath sounds.  Abdominal:     General: Abdomen is flat. Bowel sounds are normal.     Palpations: Abdomen is soft.     Tenderness: There is no abdominal tenderness. There is no guarding or rebound.     Hernia: No hernia is present.  Musculoskeletal:        General: Normal range of motion.     Cervical back: Normal range of motion and neck supple.  Skin:    General: Skin is warm and dry.     Capillary Refill: Capillary refill takes less than 2 seconds.  Neurological:     General: No focal deficit present.     Mental Status: She is alert and oriented to person, place, and time.  Psychiatric:        Mood and Affect: Mood normal.        Behavior: Behavior normal.     ED Results / Procedures / Treatments   Labs (all labs ordered are listed, but only abnormal results are displayed) Results for orders placed or performed during the hospital encounter of 03/19/20  Lipase, blood  Result Value Ref Range   Lipase 25 11 - 51 U/L  Comprehensive metabolic panel  Result Value Ref Range   Sodium 138 135 - 145 mmol/L   Potassium 3.6 3.5 - 5.1 mmol/L   Chloride 104 98 - 111 mmol/L   CO2 26 22 - 32 mmol/L   Glucose, Bld 156 (H) 70 - 99 mg/dL   BUN 13 6 - 20 mg/dL   Creatinine, Ser 0.85 0.44 - 1.00 mg/dL   Calcium 8.8 (L) 8.9 - 10.3 mg/dL   Total Protein 7.7 6.5 - 8.1 g/dL   Albumin 3.5 3.5 - 5.0 g/dL   AST 14 (L) 15 - 41 U/L   ALT 13 0 -  44 U/L   Alkaline Phosphatase 105 38 - 126 U/L   Total Bilirubin 0.4 0.3 - 1.2  mg/dL   GFR, Estimated >60 >60 mL/min   Anion gap 8 5 - 15  CBC  Result Value Ref Range   WBC 7.3 4.0 - 10.5 K/uL   RBC 4.71 3.87 - 5.11 MIL/uL   Hemoglobin 13.5 12.0 - 15.0 g/dL   HCT 42.2 36 - 46 %   MCV 89.6 80.0 - 100.0 fL   MCH 28.7 26.0 - 34.0 pg   MCHC 32.0 30.0 - 36.0 g/dL   RDW 13.8 11.5 - 15.5 %   Platelets 472 (H) 150 - 400 K/uL   nRBC 0.0 0.0 - 0.2 %  Urinalysis, Routine w reflex microscopic Urine, Clean Catch  Result Value Ref Range   Color, Urine YELLOW YELLOW   APPearance CLOUDY (A) CLEAR   Specific Gravity, Urine 1.026 1.005 - 1.030   pH 5.0 5.0 - 8.0   Glucose, UA NEGATIVE NEGATIVE mg/dL   Hgb urine dipstick NEGATIVE NEGATIVE   Bilirubin Urine NEGATIVE NEGATIVE   Ketones, ur 5 (A) NEGATIVE mg/dL   Protein, ur NEGATIVE NEGATIVE mg/dL   Nitrite NEGATIVE NEGATIVE   Leukocytes,Ua NEGATIVE NEGATIVE   CT Renal Stone Study  Result Date: 03/19/2020 CLINICAL DATA:  Left flank pain and nausea for 1 week. EXAM: CT ABDOMEN AND PELVIS WITHOUT CONTRAST TECHNIQUE: Multidetector CT imaging of the abdomen and pelvis was performed following the standard protocol without IV contrast. COMPARISON:  08/27/2019 FINDINGS: Lower chest: Lung bases are clear. Hepatobiliary: 2 circumscribed low-attenuation lesions in the liver likely representing cysts. No change since prior study. Gallbladder and bile ducts are unremarkable. Pancreas: Unremarkable. No pancreatic ductal dilatation or surrounding inflammatory changes. Spleen: Normal in size without focal abnormality. Adrenals/Urinary Tract: 13 mm diameter left adrenal gland nodule. Fat density measurements are consistent with benign fat containing adenoma. No change since prior study. There is a 3 mm stone in the lower pole of the right kidney and a 3 mm stone in the midpole and another in the lower pole of the left kidney. No hydronephrosis or hydroureter. No ureteral stones or bladder stones. Bladder wall is not thickened. Stomach/Bowel:  Stomach, small bowel, and colon are not abnormally distended. No wall thickening or inflammatory infiltration identified. Appendix is normal. Vascular/Lymphatic: No significant vascular findings are present. No enlarged abdominal or pelvic lymph nodes. Reproductive: Surgical absence of the uterus. Right adnexal/ovarian cyst measuring 3.4 x 4.5 cm in diameter. Other: No free air or free fluid in the abdomen. Small periumbilical hernias containing fat. Musculoskeletal: Degenerative changes in the lumbar spine. No destructive bone lesions. IMPRESSION: 1. Nonobstructing stones in both kidneys. No ureteral stone or obstruction. 2. Right adnexal/ovarian cyst measuring 3.4 x 4.5 cm in diameter. Consider ultrasound for further evaluation. 3. Benign-appearing left adrenal gland adenoma. 4. Small periumbilical hernias containing fat. Electronically Signed   By: Lucienne Capers M.D.   On: 03/19/2020 03:36    Radiology CT Renal Stone Study  Result Date: 03/19/2020 CLINICAL DATA:  Left flank pain and nausea for 1 week. EXAM: CT ABDOMEN AND PELVIS WITHOUT CONTRAST TECHNIQUE: Multidetector CT imaging of the abdomen and pelvis was performed following the standard protocol without IV contrast. COMPARISON:  08/27/2019 FINDINGS: Lower chest: Lung bases are clear. Hepatobiliary: 2 circumscribed low-attenuation lesions in the liver likely representing cysts. No change since prior study. Gallbladder and bile ducts are unremarkable. Pancreas: Unremarkable. No pancreatic ductal dilatation or surrounding inflammatory changes. Spleen: Normal  in size without focal abnormality. Adrenals/Urinary Tract: 13 mm diameter left adrenal gland nodule. Fat density measurements are consistent with benign fat containing adenoma. No change since prior study. There is a 3 mm stone in the lower pole of the right kidney and a 3 mm stone in the midpole and another in the lower pole of the left kidney. No hydronephrosis or hydroureter. No ureteral  stones or bladder stones. Bladder wall is not thickened. Stomach/Bowel: Stomach, small bowel, and colon are not abnormally distended. No wall thickening or inflammatory infiltration identified. Appendix is normal. Vascular/Lymphatic: No significant vascular findings are present. No enlarged abdominal or pelvic lymph nodes. Reproductive: Surgical absence of the uterus. Right adnexal/ovarian cyst measuring 3.4 x 4.5 cm in diameter. Other: No free air or free fluid in the abdomen. Small periumbilical hernias containing fat. Musculoskeletal: Degenerative changes in the lumbar spine. No destructive bone lesions. IMPRESSION: 1. Nonobstructing stones in both kidneys. No ureteral stone or obstruction. 2. Right adnexal/ovarian cyst measuring 3.4 x 4.5 cm in diameter. Consider ultrasound for further evaluation. 3. Benign-appearing left adrenal gland adenoma. 4. Small periumbilical hernias containing fat. Electronically Signed   By: Lucienne Capers M.D.   On: 03/19/2020 03:36    Procedures Procedures (including critical care time)  Medications Ordered in ED Medications  lidocaine (LIDODERM) 5 % 1 patch (has no administration in time range)  ketorolac (TORADOL) injection 60 mg (has no administration in time range)    ED Course  I have reviewed the triage vital signs and the nursing notes.  Pertinent labs & imaging results that were available during my care of the patient were reviewed by me and considered in my medical decision making (see chart for details).    Left flank pain is likely muscular.  No stones in the ureters.  Bowel is normal.  No UTI. Exam and vitals are benign and reassuring.  No signs of a surgical abdomen.  NSAIDs and lidoderm for pain.    Jenny Cohen was evaluated in Emergency Department on 03/19/2020 for the symptoms described in the history of present illness. She was evaluated in the context of the global COVID-19 pandemic, which necessitated consideration that the patient might  be at risk for infection with the SARS-CoV-2 virus that causes COVID-19. Institutional protocols and algorithms that pertain to the evaluation of patients at risk for COVID-19 are in a state of rapid change based on information released by regulatory bodies including the CDC and federal and state organizations. These policies and algorithms were followed during the patient's care in the ED.  Final Clinical Impression(s) / ED Diagnoses  Return for intractable cough, coughing up blood,fevers >100.4 unrelieved by medication, shortness of breath, intractable vomiting, chest pain, shortness of breath, weakness,numbness, changes in speech, facial asymmetry,abdominal pain, passing out,Inability to tolerate liquids or food, cough, altered mental status or any concerns. No signs of systemic illness or infection. The patient is nontoxic-appearing on exam and vital signs are within normal limits.   I have reviewed the triage vital signs and the nursing notes. Pertinent labs &imaging results that were available during my care of the patient were reviewed by me and considered in my medical decision making (see chart for details).After history, exam, and medical workup I feel the patient has beenappropriately medically screened and is safe for discharge home. Pertinent diagnoses were discussed with the patient. Patient was given return precautions.   Antwyne Pingree, MD 03/19/20 [phone]

## 2020-04-12 ENCOUNTER — Encounter: Payer: Self-pay | Admitting: Family Medicine

## 2020-04-12 ENCOUNTER — Ambulatory Visit (INDEPENDENT_AMBULATORY_CARE_PROVIDER_SITE_OTHER): Payer: Self-pay | Admitting: Family Medicine

## 2020-04-12 ENCOUNTER — Other Ambulatory Visit: Payer: Self-pay

## 2020-04-12 VITALS — BP 110/68 | HR 79 | Temp 98.2°F | Resp 18 | Ht 65.0 in | Wt 299.8 lb

## 2020-04-12 DIAGNOSIS — Z1159 Encounter for screening for other viral diseases: Secondary | ICD-10-CM

## 2020-04-12 DIAGNOSIS — Z0000 Encounter for general adult medical examination without abnormal findings: Secondary | ICD-10-CM

## 2020-04-12 DIAGNOSIS — E785 Hyperlipidemia, unspecified: Secondary | ICD-10-CM

## 2020-04-12 DIAGNOSIS — Z23 Encounter for immunization: Secondary | ICD-10-CM

## 2020-04-12 DIAGNOSIS — N83201 Unspecified ovarian cyst, right side: Secondary | ICD-10-CM

## 2020-04-12 NOTE — Assessment & Plan Note (Signed)
Per ct abd/ pelvis Check US pelvis  F/u gyn

## 2020-04-12 NOTE — Assessment & Plan Note (Signed)
Encouraged heart healthy diet, increase exercise, avoid trans fats, consider a krill oil cap daily 

## 2020-04-12 NOTE — Patient Instructions (Signed)

## 2020-04-12 NOTE — Progress Notes (Signed)
Subjective:     Jenny Cohen is a 54 y.o. female and is here for a comprehensive physical exam. The patient reports no problems. She was seen in the er recenlty for flank pain ---- ct was done and a cyst was seen on r ovary.    Social History   Socioeconomic History  . Marital status: Single    Spouse name: Not on file  . Number of children: Not on file  . Years of education: Not on file  . 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 Smoking status: Never Smoker  . Smokeless tobacco: Never Used  Vaping Use  . Vaping Use: Never used  Substance and Sexual Activity  . Alcohol use: Yes    Alcohol/week: 0.0 standard drinks    Comment: social  . Drug use: No  . Sexual activity: Yes    Partners: Male    Birth control/protection: Condom  Other Topics Concern  . Not on file  Social History Narrative   Exercise--= daily 45 min and zumba 3 days a week   Social Determinants of Health   Financial Resource Strain:   . Difficulty of Paying Living Expenses: Not on file  Food Insecurity:   . Worried About Charity fundraiser in the Last Year: Not on file  . Ran Out of Food in the Last Year: Not on file  Transportation Needs:   . Lack of Transportation (Medical): Not on file  . Lack of Transportation (Non-Medical): Not on file  Physical Activity:   . Days of Exercise per Week: Not on file  . Minutes of Exercise per Session: Not on file  Stress:   . Feeling of Stress : Not on file  Social Connections:   . Frequency of Communication with Friends and Family: Not on file  . Frequency of Social Gatherings with Friends and Family: Not on file  . Attends Religious Services: Not on file  . Active Member of Clubs or Organizations: Not on file  . Attends Archivist Meetings: Not on file  . Marital Status: Not on file  Intimate Partner Violence:   . Fear of Current or Ex-Partner: Not on file  .  Emotionally Abused: Not on file  . Physically Abused: Not on file  . Sexually Abused: Not on file   Health Maintenance  Topic Date Due  . Hepatitis C Screening  Never done  . INFLUENZA VACCINE  12/28/2019  . MAMMOGRAM  11/25/2020  . PAP SMEAR-Modifier  08/13/2022  . COLONOSCOPY  09/12/2022  . TETANUS/TDAP  12/21/2024  . COVID-19 Vaccine  Completed  . HIV Screening  Completed    The following portions of the patient's history were reviewed and updated as appropriate:  She  has a past medical history of Allergy, Anemia, Arthritis, Chicken pox, DUB (dysfunctional uterine bleeding), Fibroids, Heart murmur, Hyperlipidemia, and Morbid obesity ([place]). She does not have any pertinent problems on file. She  has a past surgical history that includes Cesarean section; Endometrial ablation; laparoscopy (1990); Partial hysterectomy; Dilation and curettage of uterus; and Abdominal hysterectomy. Her family history includes Alzheimer's disease in her maternal grandfather and maternal grandmother; Colon cancer in her maternal grandfather; Diabetes in her mother; Heart failure in her paternal grandmother; Hyperlipidemia in her father; Hypertension in her father; Pancreatic cancer in her maternal uncle; Sarcoidosis in her mother; Syncope episode in her father; Throat cancer in her paternal  grandfather. She  reports that she has never smoked. She has never used smokeless tobacco. She reports current alcohol use. She reports that she does not use drugs. She has a current medication list which includes the following prescription(s): alprazolam, fluticasone, ibuprofen, lidocaine, and meloxicam, and the following Facility-Administered Medications: sodium chloride and sodium chloride. Current Outpatient Medications on File Prior to Visit  Medication Sig Dispense Refill  . ALPRAZolam (XANAX) 0.25 MG tablet TAKE 1 TABLET(0.25 MG) BY MOUTH THREE TIMES DAILY AS NEEDED FOR ANXIETY (Patient taking differently: Take 0.25 mg  by mouth 3 (three) times daily as needed for anxiety. ) 30 tablet 0  . fluticasone (FLONASE) 50 MCG/ACT nasal spray Place 2 sprays into both nostrils daily. 16 g 6  . ibuprofen (ADVIL) 200 MG tablet Take 600-800 mg by mouth every 6 (six) hours as needed for moderate pain.    Marland Kitchen lidocaine (LIDODERM) 5 % Place 1 patch onto the skin daily. Remove & Discard patch within 12 hours or as directed by MD 30 patch 0  . meloxicam (MOBIC) 15 MG tablet Take 1 tablet (15 mg total) by mouth daily. 7 tablet 0   Current Facility-Administered Medications on File Prior to Visit  Medication Dose Route Frequency Provider Last Rate Last Admin  . 0.9 %  sodium chloride infusion  500 mL Intravenous Once Nelida Meuse III, MD      . 0.9 %  sodium chloride infusion  500 mL Intravenous Once Nelida Meuse III, MD       She is allergic to miconazole nitrate and vagisil..  Review of Systems Review of Systems  Constitutional: Negative for activity change, appetite change and fatigue.  HENT: Negative for hearing loss, congestion, tinnitus and ear discharge.  dentist q35m Eyes: Negative for visual disturbance (see optho q1y -- vision corrected to 20/20 with glasses).  Respiratory: Negative for cough, chest tightness and shortness of breath.   Cardiovascular: Negative for chest pain, palpitations and leg swelling.  Gastrointestinal: Negative for abdominal pain, diarrhea, constipation and abdominal distention.  Genitourinary: Negative for urgency, frequency, decreased urine volume and difficulty urinating.  Musculoskeletal: Negative for back pain, arthralgias and gait problem.  Skin: Negative for color change, pallor and rash.  Neurological: Negative for dizziness, light-headedness, numbness and headaches.  Hematological: Negative for adenopathy. Does not bruise/bleed easily.  Psychiatric/Behavioral: Negative for suicidal ideas, confusion, sleep disturbance, self-injury, dysphoric mood, decreased concentration and agitation.        Objective:    BP 110/68 (BP Location: Right Arm, Patient Position: Sitting, Cuff Size: Large)   Pulse 79   Temp 98.2 F (36.8 C) (Oral)   Resp 18   Ht 5\' 5"  (1.651 m)   Wt 299 lb 12.8 oz (136 kg)   LMP 07/14/2013   SpO2 98%   BMI 49.89 kg/m  General appearance: alert, cooperative, appears stated age and no distress Head: Normocephalic, without obvious abnormality, atraumatic Eyes: negative findings: lids and lashes normal, conjunctivae and sclerae normal and pupils equal, round, reactive to light and accomodation Ears: normal TM's and external ear canals both ears Neck: no adenopathy, no carotid bruit, no JVD, supple, symmetrical, trachea midline and thyroid not enlarged, symmetric, no tenderness/mass/nodules Back: symmetric, no curvature. ROM normal. No CVA tenderness. Lungs: clear to auscultation bilaterally Breasts: gyn Heart: regular rate and rhythm, S1, S2 normal, no murmur, click, rub or gallop Abdomen: soft, non-tender; bowel sounds normal; no masses,  no organomegaly Pelvic: deferred--gyn Extremities: extremities normal, atraumatic, no cyanosis or edema Pulses:  2+ and symmetric Skin: Skin color, texture, turgor normal. No rashes or lesions Lymph nodes: Cervical, supraclavicular, and axillary nodes normal. Neurologic: Alert and oriented X 3, normal strength and tone. Normal symmetric reflexes. Normal coordination and gait    Assessment:    Healthy female exam.      Plan:    ghm utd Check labs See After Visit Summary for Counseling Recommendations    1. Need for influenza vaccination  - Flu Vaccine QUAD 36+ mos IM  2. Preventative health care See above  - Lipid panel - CBC with Differential/Platelet - TSH - Comprehensive metabolic panel  3. Hyperlipidemia, unspecified hyperlipidemia type Tolerating statin, encouraged heart healthy diet, avoid trans fats, minimize simple carbs and saturated fats. Increase exercise as tolerated - Lipid panel -  TSH - Comprehensive metabolic panel  4. Cyst of right ovary Check [place]  - US Pelvic Complete With Transvaginal; Future  5. Need for hepatitis C screening test   - Hepatitis C antibody

## 2020-04-12 NOTE — Assessment & Plan Note (Signed)
ghm utd Check labs  See avs  

## 2020-04-13 LAB — COMPREHENSIVE METABOLIC PANEL
ALT: 12 U/L (ref 0–35)
AST: 11 U/L (ref 0–37)
Albumin: 3.6 g/dL (ref 3.5–5.2)
Alkaline Phosphatase: 108 U/L (ref 39–117)
BUN: 13 mg/dL (ref 6–23)
CO2: 30 mEq/L (ref 19–32)
Calcium: 8.9 mg/dL (ref 8.4–10.5)
Chloride: 103 mEq/L (ref 96–112)
Creatinine, Ser: 0.76 mg/dL (ref 0.40–1.20)
GFR: 88.41 mL/min (ref >60.00)
Glucose, Bld: 91 mg/dL (ref 70–99)
Potassium: 4.3 mEq/L (ref 3.5–5.1)
Sodium: 139 mEq/L (ref 135–145)
Total Bilirubin: 0.5 mg/dL (ref 0.2–1.2)
Total Protein: 6.9 g/dL (ref 6.0–8.3)

## 2020-04-13 LAB — LIPID PANEL
Cholesterol: 218 mg/dL — ABNORMAL HIGH (ref 0–200)
HDL: 50 mg/dL (ref >39.00)
LDL Cholesterol: 149 mg/dL — ABNORMAL HIGH (ref 0–99)
NonHDL: 168.37
Total CHOL/HDL Ratio: 4
Triglycerides: 95 mg/dL (ref 0.0–149.0)
VLDL: 19 mg/dL (ref 0.0–40.0)

## 2020-04-13 LAB — CBC WITH DIFFERENTIAL/PLATELET
Basophils Absolute: 0 10*3/uL (ref 0.0–0.1)
Basophils Relative: 0.6 % (ref 0.0–3.0)
Eosinophils Absolute: 0.2 10*3/uL (ref 0.0–0.7)
Eosinophils Relative: 2.6 % (ref 0.0–5.0)
HCT: 40.7 % (ref 36.0–46.0)
Hemoglobin: 13.3 g/dL (ref 12.0–15.0)
Lymphocytes Relative: 40 % (ref 12.0–46.0)
Lymphs Abs: 2.4 10*3/uL (ref 0.7–4.0)
MCHC: 32.6 g/dL (ref 30.0–36.0)
MCV: 86.5 fl (ref 78.0–100.0)
Monocytes Absolute: 0.4 10*3/uL (ref 0.1–1.0)
Monocytes Relative: 7.1 % (ref 3.0–12.0)
Neutro Abs: 2.9 10*3/uL (ref 1.4–7.7)
Neutrophils Relative %: 49.7 % (ref 43.0–77.0)
Platelets: 423 10*3/uL — ABNORMAL HIGH (ref 150.0–400.0)
RBC: 4.7 Mil/uL (ref 3.87–5.11)
RDW: 15 % (ref 11.5–15.5)
WBC: 5.9 10*3/uL (ref 4.0–10.5)

## 2020-04-13 LAB — HEPATITIS C ANTIBODY
Hepatitis C Ab: NONREACTIVE
SIGNAL TO CUT-OFF: 0.01 (ref <1.00)

## 2020-04-13 LAB — TSH: TSH: 2.14 u[IU]/mL (ref 0.35–4.50)

## 2020-04-14 ENCOUNTER — Ambulatory Visit (HOSPITAL_BASED_OUTPATIENT_CLINIC_OR_DEPARTMENT_OTHER): Payer: Self-pay

## 2020-04-16 ENCOUNTER — Ambulatory Visit (HOSPITAL_BASED_OUTPATIENT_CLINIC_OR_DEPARTMENT_OTHER)
Admission: RE | Admit: 2020-04-16 | Discharge: 2020-04-16 | Disposition: A | Discharge status: Home / Self Care | Payer: Self-pay | Source: Ambulatory Visit | Attending: Family Medicine | Admitting: Family Medicine

## 2020-04-16 ENCOUNTER — Other Ambulatory Visit: Payer: Self-pay

## 2020-04-16 DIAGNOSIS — N83201 Unspecified ovarian cyst, right side: Secondary | ICD-10-CM

## 2020-04-19 ENCOUNTER — Encounter: Payer: Self-pay | Admitting: Family Medicine

## 2020-05-20 ENCOUNTER — Other Ambulatory Visit: Payer: Self-pay | Admitting: Family Medicine

## 2020-05-20 DIAGNOSIS — F419 Anxiety disorder, unspecified: Secondary | ICD-10-CM

## 2020-05-24 NOTE — Telephone Encounter (Signed)
Requesting: alprazolam 0.25mg   Contract: 08/29/2018 UDS: 08/30/2018 Last Visit: 04/12/2020 Next Visit: None  Last Refill: 12/22/2019 #30 and 0RF Pt sig: 1 tab tid prn  Please Advise

## 2020-07-07 IMAGING — CT CT HEAD W/O CM
3 series · 16 of 47 positions shown, 19 images · non-contrast
Comparison: None.

CLINICAL DATA: Headache.

EXAM:
CT HEAD WITHOUT CONTRAST
TECHNIQUE: Contiguous axial images were obtained from the base of the skull
through the vertex without intravenous contrast.

[Series 2: head wo · axial · 0.47mm/px · z∈[-190,-55]mm · 10 of 33 slices shown, 13 images]
[im 3/33  brain]
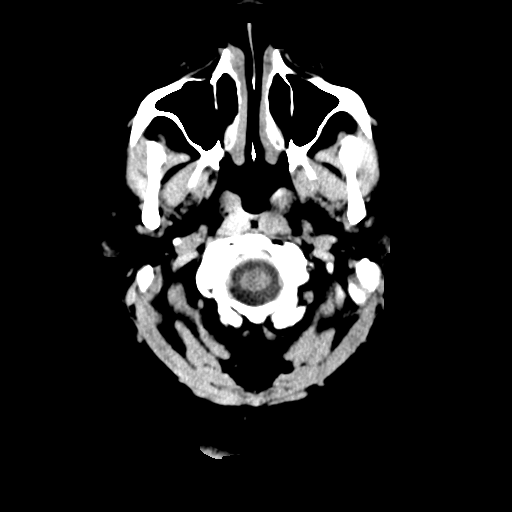
[im 3/33  bone]
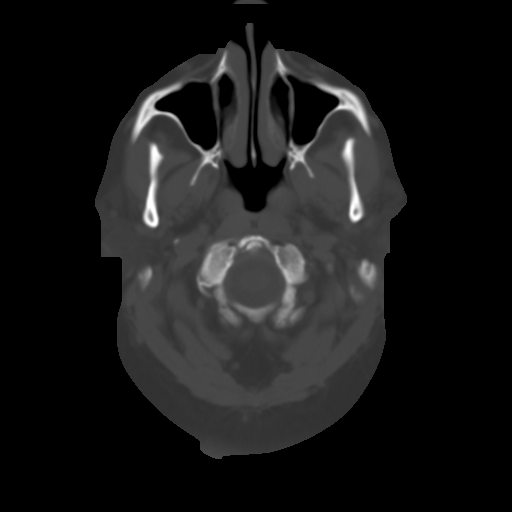
[im 6/33  brain]
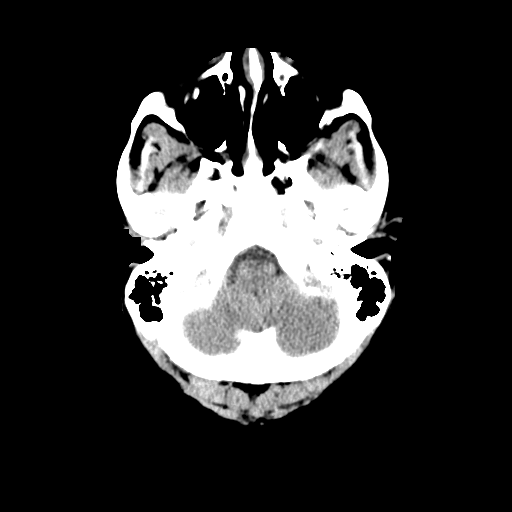
[im 9/33  brain]
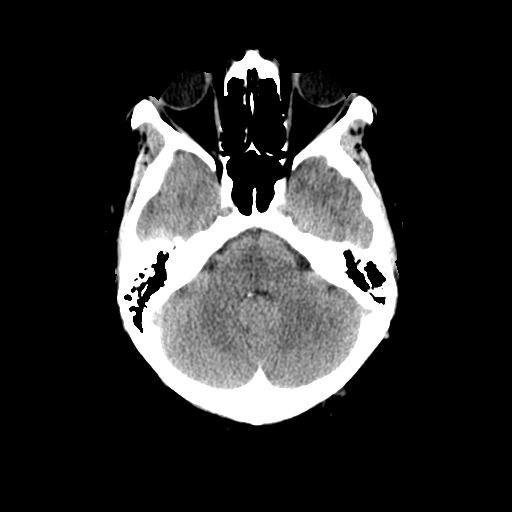
[im 12/33  brain]
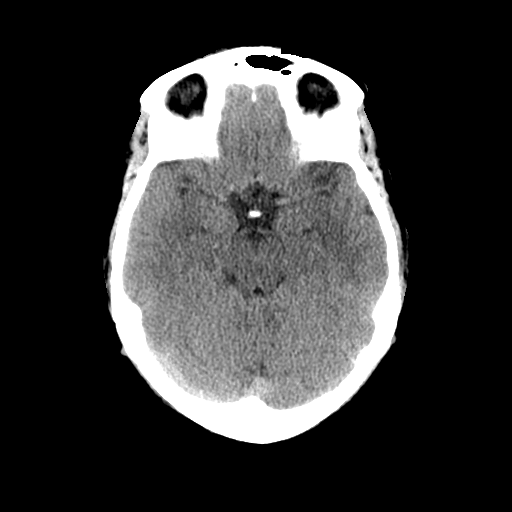
[im 15/33  brain]
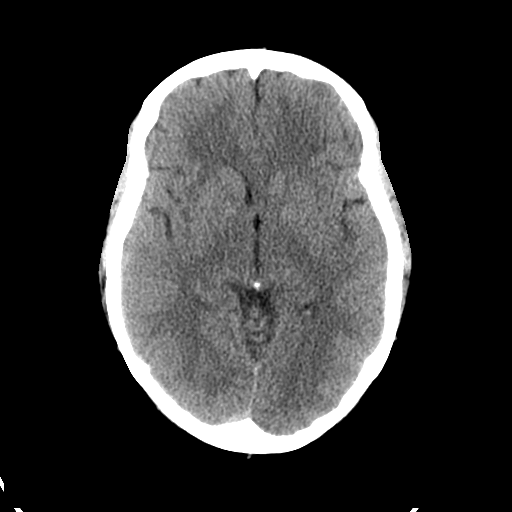
[im 15/33  bone]
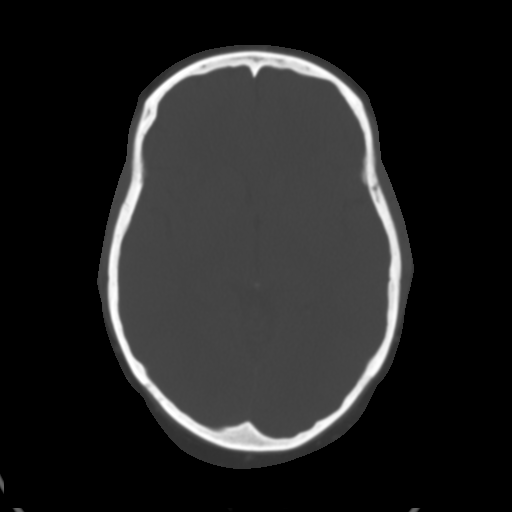
[im 18/33  brain]
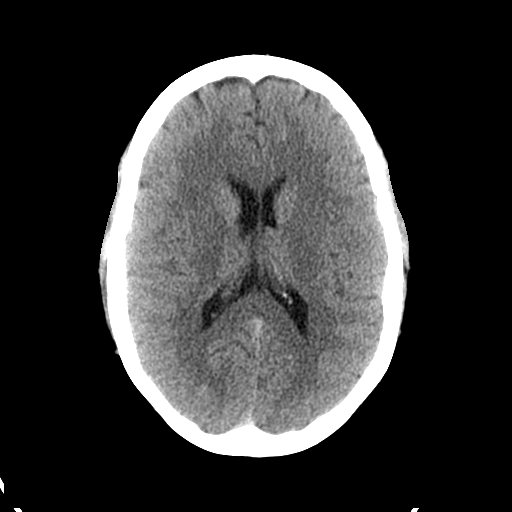
[im 21/33  brain]
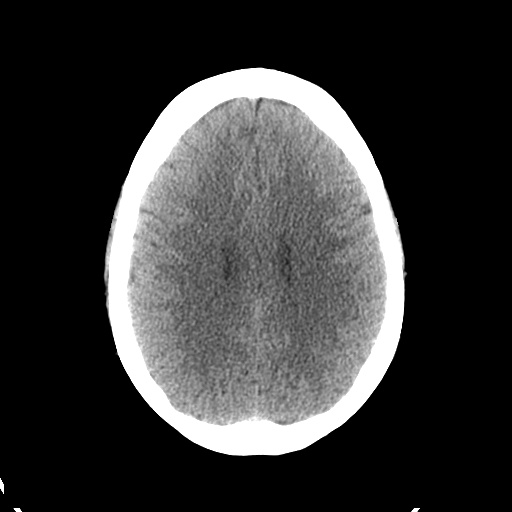
[im 25/33  brain]
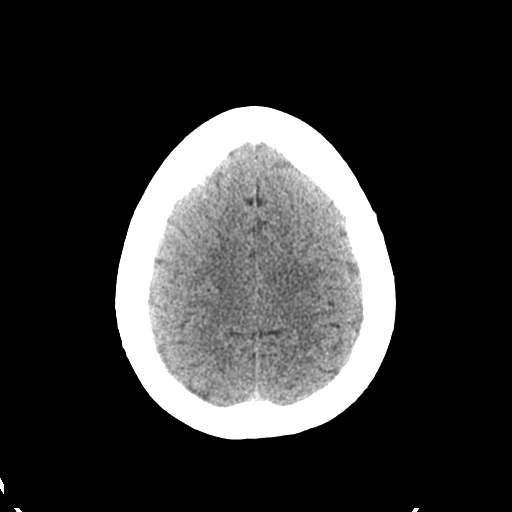
[im 27/33  brain]
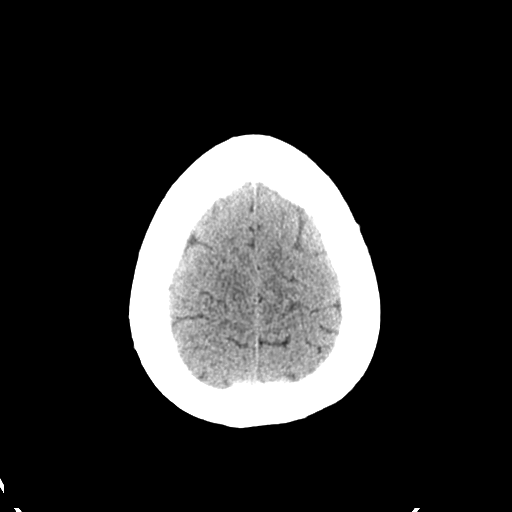
[im 27/33  bone]
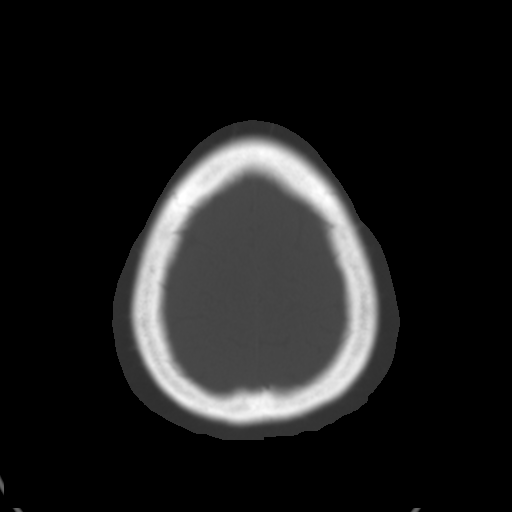
[im 30/33  brain]
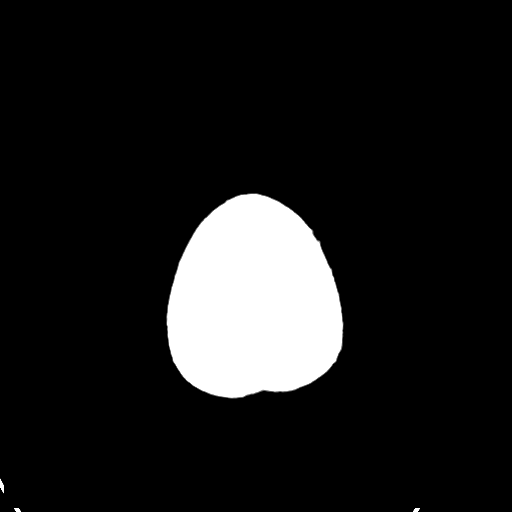

[Series 5: coronal soft tissue · coronal · 0.33mm/px · 3 of 67 slices shown]
[im 23/67  brain]
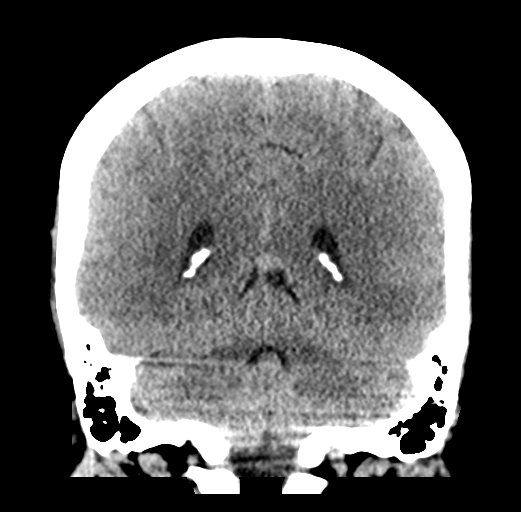
[im 30/67  brain]
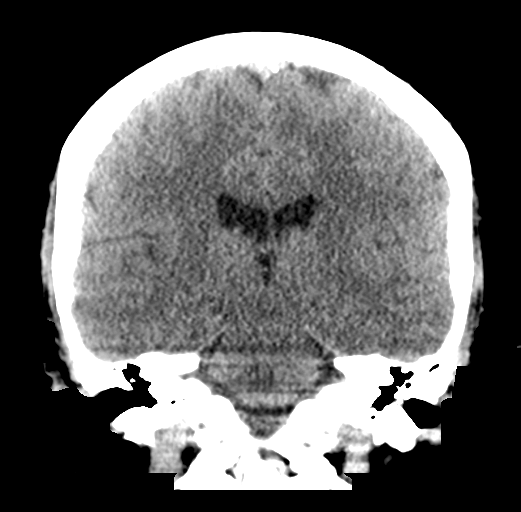
[im 37/67  brain]
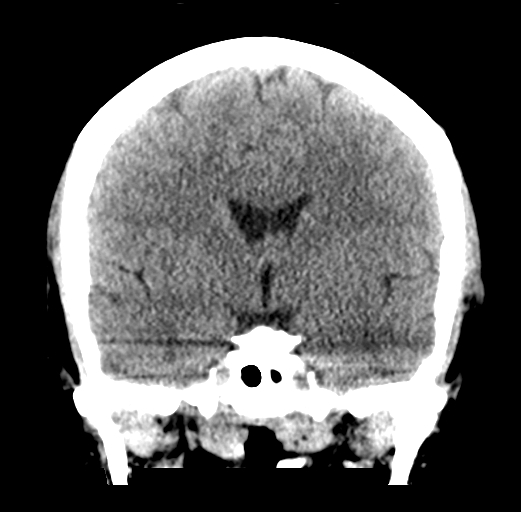

[Series 6: sagittal soft tissue · sagittal · 0.33mm/px · 3 of 58 slices shown]
[im 20/58  brain]
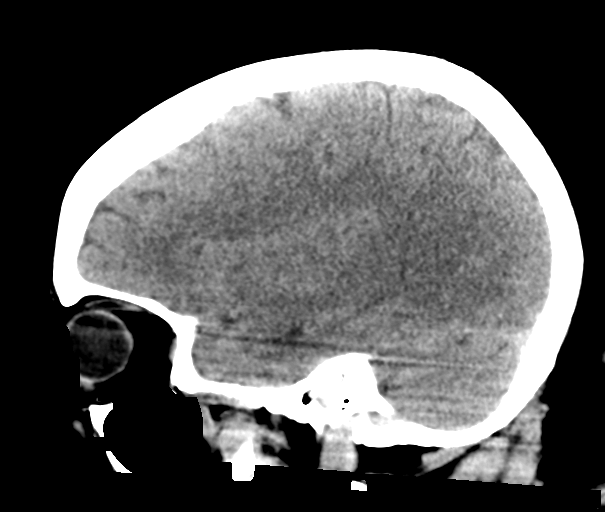
[im 29/58  brain]
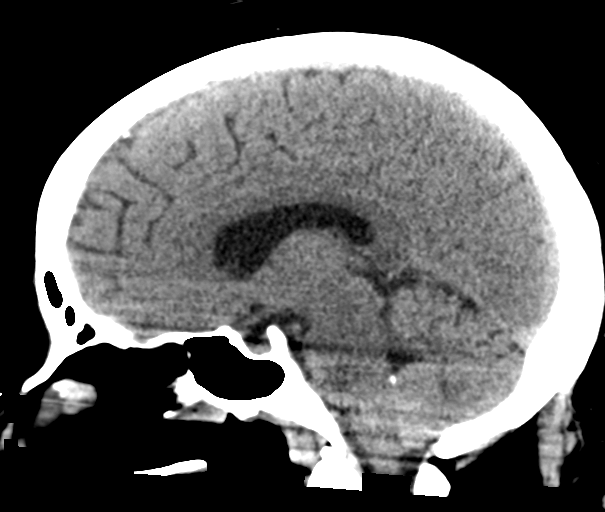
[im 39/58  brain]
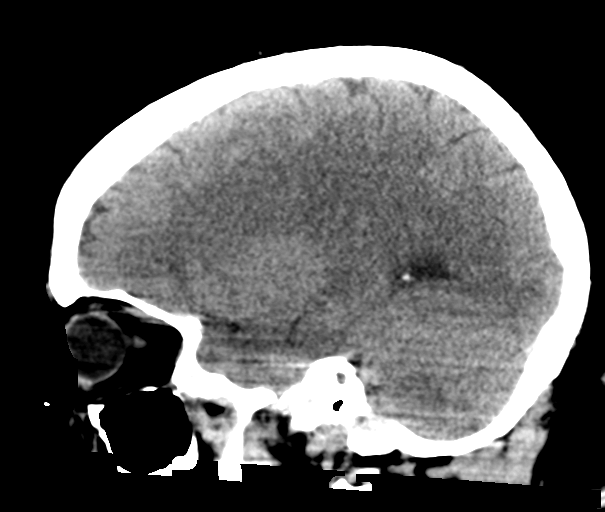

[16 of 47 positions shown; findings below may reference images not displayed]

FINDINGS: Brain: No evidence of acute infarction, hemorrhage, hydrocephalus,
extra-axial collection or mass lesion/mass effect.

Vascular: No hyperdense vessel or unexpected calcification.

Skull: Normal. Negative for fracture or focal lesion.

Sinuses/Orbits: No acute finding.

Other: None.
IMPRESSION: Normal CT head without contrast.

## 2020-10-26 ENCOUNTER — Encounter: Payer: Self-pay | Admitting: Family Medicine

## 2020-10-26 NOTE — Telephone Encounter (Signed)
If can cause abnormal bms ---have not heard anyone c/o green bms but it would not surprise me

## 2020-10-28 ENCOUNTER — Other Ambulatory Visit: Payer: Self-pay | Admitting: Family Medicine

## 2020-10-28 DIAGNOSIS — F419 Anxiety disorder, unspecified: Secondary | ICD-10-CM

## 2020-10-28 NOTE — Telephone Encounter (Signed)
Requesting:Alprazolam Contract:08/30/2018 UDS:08/30/2018 Last Visit:04/12/2020 Next Visit:none scheduled Last Refill:05/24/2020  Please Advise

## 2021-01-03 ENCOUNTER — Other Ambulatory Visit: Payer: Self-pay | Admitting: Family Medicine

## 2021-01-03 DIAGNOSIS — F419 Anxiety disorder, unspecified: Secondary | ICD-10-CM

## 2021-01-03 NOTE — Telephone Encounter (Signed)
Requesting: alprazolam 0.'25mg'$   Contract: 08/29/2018 UDS: 08/30/2018 Last Visit:04/12/2020 Next Visit: None Last Refill: 10/28/2020 #30 and 0RF  Please Advise

## 2021-02-08 ENCOUNTER — Other Ambulatory Visit: Payer: Self-pay | Admitting: Family Medicine

## 2021-02-08 DIAGNOSIS — Z1231 Encounter for screening mammogram for malignant neoplasm of breast: Secondary | ICD-10-CM

## 2021-02-16 ENCOUNTER — Other Ambulatory Visit: Payer: Self-pay

## 2021-02-16 ENCOUNTER — Ambulatory Visit
Admission: RE | Admit: 2021-02-16 | Discharge: 2021-02-16 | Disposition: A | Discharge status: Home / Self Care | Payer: No Typology Code available for payment source | Source: Ambulatory Visit

## 2021-02-16 DIAGNOSIS — Z1231 Encounter for screening mammogram for malignant neoplasm of breast: Secondary | ICD-10-CM

## 2021-03-03 ENCOUNTER — Encounter: Payer: Self-pay | Admitting: Family Medicine

## 2021-03-23 ENCOUNTER — Other Ambulatory Visit: Payer: Self-pay

## 2021-03-24 ENCOUNTER — Encounter: Payer: Self-pay | Admitting: Family Medicine

## 2021-03-28 ENCOUNTER — Ambulatory Visit (INDEPENDENT_AMBULATORY_CARE_PROVIDER_SITE_OTHER): Payer: Self-pay | Admitting: Family Medicine

## 2021-03-28 ENCOUNTER — Other Ambulatory Visit: Payer: Self-pay

## 2021-03-28 ENCOUNTER — Encounter: Payer: Self-pay | Admitting: Family Medicine

## 2021-03-28 VITALS — BP 118/49 | HR 73 | Temp 98.6°F | Resp 16 | Ht 65.0 in | Wt 286.0 lb

## 2021-03-28 DIAGNOSIS — N760 Acute vaginitis: Secondary | ICD-10-CM

## 2021-03-28 DIAGNOSIS — Z0000 Encounter for general adult medical examination without abnormal findings: Secondary | ICD-10-CM

## 2021-03-28 DIAGNOSIS — J0140 Acute pansinusitis, unspecified: Secondary | ICD-10-CM | POA: Insufficient documentation

## 2021-03-28 DIAGNOSIS — J40 Bronchitis, not specified as acute or chronic: Secondary | ICD-10-CM

## 2021-03-28 DIAGNOSIS — E785 Hyperlipidemia, unspecified: Secondary | ICD-10-CM

## 2021-03-28 MED ORDER — FLUCONAZOLE 150 MG PO TABS: ORAL_TABLET | ORAL | 0 refills | Status: DC

## 2021-03-28 MED ORDER — PREDNISONE 10 MG PO TABS: ORAL_TABLET | ORAL | 0 refills | Status: DC

## 2021-03-28 MED ORDER — FLUTICASONE PROPIONATE 50 MCG/ACT NA SUSP: 2.0000 | Freq: Every day | NASAL | 6 refills | Status: DC

## 2021-03-28 MED ORDER — AMOXICILLIN-POT CLAVULANATE 875-125 MG PO TABS: 1.0000 | ORAL_TABLET | Freq: Two times a day (BID) | ORAL | 0 refills | Status: DC

## 2021-03-28 NOTE — Patient Instructions (Signed)
Preventive Care 40-56 Years Old, Female Preventive care refers to lifestyle choices and visits with your health care provider that can promote health and wellness. This includes: A yearly physical exam. This is also called an annual wellness visit. Regular dental and eye exams. Immunizations. Screening for certain conditions. Healthy lifestyle choices, such as: Eating a healthy diet. Getting regular exercise. Not using drugs or products that contain nicotine and tobacco. Limiting alcohol use. What can I expect for my preventive care visit? Physical exam Your health care provider will check your: Height and weight. These may be used to calculate your BMI (body mass index). BMI is a measurement that tells if you are at a healthy weight. Heart rate and blood pressure. Body temperature. Skin for abnormal spots. Counseling Your health care provider may ask you questions about your: Past medical problems. Family's medical history. Alcohol, tobacco, and drug use. Emotional well-being. Home life and relationship well-being. Sexual activity. Diet, exercise, and sleep habits. Work and work environment. Access to firearms. Method of birth control. Menstrual cycle. Pregnancy history. What immunizations do I need? Vaccines are usually given at various ages, according to a schedule. Your health care provider will recommend vaccines for you based on your age, medical history, and lifestyle or other factors, such as travel or where you work. What tests do I need? Blood tests Lipid and cholesterol levels. These may be checked every 5 years, or more often if you are over 50 years old. Hepatitis C test. Hepatitis B test. Screening Lung cancer screening. You may have this screening every year starting at age 55 if you have a 30-pack-year history of smoking and currently smoke or have quit within the past 15 years. Colorectal cancer screening. All adults should have this screening starting at  age 50 and continuing until age 75. Your health care provider may recommend screening at age 45 if you are at increased risk. You will have tests every 1-10 years, depending on your results and the type of screening test. Diabetes screening. This is done by checking your blood sugar (glucose) after you have not eaten for a while (fasting). You may have this done every 1-3 years. Mammogram. This may be done every 1-2 years. Talk with your health care provider about when you should start having regular mammograms. This may depend on whether you have a family history of breast cancer. BRCA-related cancer screening. This may be done if you have a family history of breast, ovarian, tubal, or peritoneal cancers. Pelvic exam and Pap test. This may be done every 3 years starting at age 21. Starting at age 30, this may be done every 5 years if you have a Pap test in combination with an HPV test. Other tests STD (sexually transmitted disease) testing, if you are at risk. Bone density scan. This is done to screen for osteoporosis. You may have this scan if you are at high risk for osteoporosis. Talk with your health care provider about your test results, treatment options, and if necessary, the need for more tests. Follow these instructions at home: Eating and drinking  Eat a diet that includes fresh fruits and vegetables, whole grains, lean protein, and low-fat dairy products. Take vitamin and mineral supplements as recommended by your health care provider. Do not drink alcohol if: Your health care provider tells you not to drink. You are pregnant, may be pregnant, or are planning to become pregnant. If you drink alcohol: Limit how much you have to 0-1 drink a day. Be   aware of how much alcohol is in your drink. In the [place] one drink equals one 12 oz bottle of beer (355 mL), one 5 oz glass of wine (148 mL), or one 1 oz glass of hard liquor (44 mL). Lifestyle Take daily care of your teeth and  gums. Brush your teeth every morning and night with fluoride toothpaste. Floss one time each day. Stay active. Exercise for at least 30 minutes 5 or more days each week. Do not use any products that contain nicotine or tobacco, such as cigarettes, e-cigarettes, and chewing tobacco. If you need help quitting, ask your health care provider. Do not use drugs. If you are sexually active, practice safe sex. Use a condom or other form of protection to prevent STIs (sexually transmitted infections). If you do not wish to become pregnant, use a form of birth control. If you plan to become pregnant, see your health care provider for a prepregnancy visit. If told by your health care provider, take low-dose aspirin daily starting at age 63. Find healthy ways to cope with stress, such as: Meditation, yoga, or listening to music. Journaling. Talking to a trusted person. Spending time with friends and family. Safety Always wear your seat belt while driving or riding in a vehicle. Do not drive: If you have been drinking alcohol. Do not ride with someone who has been drinking. When you are tired or distracted. While texting. Wear a helmet and other protective equipment during sports activities. If you have firearms in your house, make sure you follow all gun safety procedures. What's next? Visit your health care provider once a year for an annual wellness visit. Ask your health care provider how often you should have your eyes and teeth checked. Stay up to date on all vaccines. This information is not intended to replace advice given to you by your health care provider. Make sure you discuss any questions you have with your health care provider. Document Revised: 07/23/2020 Document Reviewed: 01/24/2018 Elsevier Patient Education  2022 [employer].

## 2021-03-28 NOTE — Progress Notes (Signed)
Subjective:   By signing my name below, I, Shehryar Baig, attest that this documentation has been prepared under the direction and in the presence of Dr. Roma Schanz, DO. 03/28/2021     Patient ID: Jenny Cohen, female    DOB: 11/21/1965, 55 y.o.   MRN: [id number]  Chief Complaint  Patient presents with   Annual Exam   URI    Complains of cold symptoms x 2 weeks. Tested negative for covid twice.     HPI Patient is in today for a comprehensive physical exam.   Congestion/Cough/Sore throat- She complains of congestion, sore throat, cough for the past week. She also has pain in her sinus when coughing. She thought it was an allergic reaction and started taking benadryl and found no relief. Her symptoms have worsened since. She started taking dayquil and mucinex yesterday and found relief. She is taking Flonase nasal spray at night to relief congestion while sleeping. She has taken 2 covid-19 tests and was negative for both.  Anti-biotic- She not allergic to anti-biotics but typically needs to take diflucan while on anti-biotics.  Anxiety- She continues taking 0.25 mg xanax 3x daily PRN and finds no new issues while taking it.   CPE She denies having any fever, new moles, muscle pain, joint pain, chest pain, SOB, wheezing, n/v/d, constipation, blood in stool, dysuria, frequency, hematuria at this time. Exercise- She participates in exercise by doing cardiovascular exercises for 30 minutes daily.  Immunizations- She is eligible for the shingles vaccine and is not interested in getting it at this time. She is interested in getting the flu vaccine. She was informed of the bivalent Covid-19 vaccine.  Social history- She has no recent changes to her family medical history. She has no recent surgical procedures.  Vision- She is UTD on vision care. She has an upcomming appointment this week.     Past Medical History:  Diagnosis Date   Allergy    Anemia    Arthritis    LEFT  SHOULDER   Chicken pox    DUB (dysfunctional uterine bleeding)    Fibroids    Heart murmur    as a child   Hyperlipidemia    Morbid obesity ([place])    BMI 47    Past Surgical History:  Procedure Laterality Date   ABDOMINAL HYSTERECTOMY     CESAREAN SECTION     DILATION AND CURETTAGE OF UTERUS     ENDOMETRIAL ABLATION     LAPAROSCOPY  1990   with laparotomy   PARTIAL HYSTERECTOMY      Family History  Problem Relation Age of Onset   Hyperlipidemia Father    Hypertension Father    Syncope episode Father    Diabetes Mother    Sarcoidosis Mother    Pancreatic cancer Maternal Uncle    Colon cancer Maternal Grandfather    Alzheimer's disease Maternal Grandfather    Alzheimer's disease Maternal Grandmother    Heart failure Paternal Grandmother    Throat cancer Paternal Grandfather    Esophageal cancer Neg Hx    Rectal cancer Neg Hx    Stomach cancer Neg Hx     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[place]  Tobacco Use  Smoking status: Never   Smokeless tobacco: Never  Vaping Use   Vaping Use: Never used  Substance and Sexual Activity   Alcohol use: Yes    Alcohol/week: 0.0 standard drinks    Comment: social   Drug use: No   Sexual activity: Yes    Partners: Male    Birth control/protection: Condom  Other Topics Concern   Not on file  Social History Narrative   Exercise--= daily 54min 5 days a week    Social Determinants of Health   Financial Resource Strain: Not on file  Food Insecurity: Not on file  Transportation Needs: Not on file  Physical Activity: Not on file  Stress: Not on file  Social Connections: Not on file  Intimate Partner Violence: Not on file    Outpatient Medications Prior to Visit  Medication Sig Dispense Refill    ALPRAZolam (XANAX) 0.25 MG tablet TAKE 1 TABLET(0.25 MG) BY MOUTH THREE TIMES DAILY AS NEEDED FOR ANXIETY 30 tablet 1   ibuprofen (ADVIL) 200 MG tablet Take 600-800 mg by mouth every 6 (six) hours as needed for moderate pain.     fluticasone (FLONASE) 50 MCG/ACT nasal spray Place 2 sprays into both nostrils daily. 16 g 6   lidocaine (LIDODERM) 5 % Place 1 patch onto the skin daily. Remove & Discard patch within 12 hours or as directed by MD 30 patch 0   meloxicam (MOBIC) 15 MG tablet Take 1 tablet (15 mg total) by mouth daily. 7 tablet 0   0.9 %  sodium chloride infusion      0.9 %  sodium chloride infusion      No facility-administered medications prior to visit.    Allergies  Allergen Reactions   Miconazole Nitrate Itching   Vagisil Itching    Review of Systems  Constitutional:  Negative for fever and malaise/fatigue.  HENT:  Positive for congestion, sinus pain (while coughing) and sore throat.   Eyes:  Negative for blurred vision.  Respiratory:  Positive for cough. Negative for shortness of breath and wheezing.   Cardiovascular:  Negative for chest pain, palpitations and leg swelling.  Gastrointestinal:  Negative for abdominal pain, blood in stool, constipation, diarrhea, nausea and vomiting.  Genitourinary:  Negative for dysuria, frequency and hematuria.  Musculoskeletal:  Negative for falls, joint pain and myalgias.  Skin:  Negative for rash.       (-)New moles  Neurological:  Negative for dizziness, loss of consciousness and headaches.  Endo/Heme/Allergies:  Negative for environmental allergies.  Psychiatric/Behavioral:  Negative for depression. The patient is not nervous/anxious.       Objective:    Physical Exam Vitals and nursing note reviewed.  Constitutional:      General: She is not in acute distress.    Appearance: Normal appearance. She is not ill-appearing.  HENT:     Head: Normocephalic and atraumatic.     Right Ear: Tympanic membrane, ear canal and  external ear normal.     Left Ear: Tympanic membrane, ear canal and external ear normal.     Ears:     Comments: Yellow fluid behind right TM Eyes:     Extraocular Movements: Extraocular movements intact.     Pupils: Pupils are equal, round, and reactive to light.  Cardiovascular:     Rate and Rhythm: Normal rate and regular rhythm.     Heart sounds: Normal heart sounds. No murmur heard.   No gallop.  Pulmonary:     Effort: Pulmonary effort is normal.  No respiratory distress.     Breath sounds: Wheezing (with cough) present. No rales.  Abdominal:     General: Bowel sounds are normal. There is no distension.     Palpations: Abdomen is soft.     Tenderness: There is no abdominal tenderness. There is no guarding.  Skin:    General: Skin is warm and dry.  Neurological:     Mental Status: She is alert and oriented to person, place, and time.  Psychiatric:        Behavior: Behavior normal.        Judgment: Judgment normal.    BP (!) 118/49 (BP Location: Right Arm, Patient Position: Sitting, Cuff Size: Large)   Pulse 73   Temp 98.6 F (37 C) (Oral)   Resp 16   Ht 5\' 5"  (1.651 m)   Wt 286 lb (129.7 kg)   LMP 07/14/2013   SpO2 99%   BMI 47.59 kg/m  Wt Readings from Last 3 Encounters:  03/28/21 286 lb (129.7 kg)  04/12/20 299 lb 12.8 oz (136 kg)  11/20/19 294 lb (133.4 kg)    Diabetic Foot Exam - Simple   No data filed    Lab Results  Component Value Date   WBC 5.9 04/12/2020   HGB 13.3 04/12/2020   HCT 40.7 04/12/2020   PLT 423.0 (H) 04/12/2020   GLUCOSE 91 04/12/2020   CHOL 218 (H) 04/12/2020   TRIG 95.0 04/12/2020   HDL 50.00 04/12/2020   LDLCALC 149 (H) 04/12/2020   ALT 12 04/12/2020   AST 11 04/12/2020   NA 139 04/12/2020   K 4.3 04/12/2020   CL 103 04/12/2020   CREATININE 0.76 04/12/2020   BUN 13 04/12/2020   CO2 30 04/12/2020   TSH 2.14 04/12/2020   HGBA1C 5.8 (H) 07/30/2013    Lab Results  Component Value Date   TSH 2.14 04/12/2020   Lab  Results  Component Value Date   WBC 5.9 04/12/2020   HGB 13.3 04/12/2020   HCT 40.7 04/12/2020   MCV 86.5 04/12/2020   PLT 423.0 (H) 04/12/2020   Lab Results  Component Value Date   NA 139 04/12/2020   K 4.3 04/12/2020   CO2 30 04/12/2020   GLUCOSE 91 04/12/2020   BUN 13 04/12/2020   CREATININE 0.76 04/12/2020   BILITOT 0.5 04/12/2020   ALKPHOS 108 04/12/2020   AST 11 04/12/2020   ALT 12 04/12/2020   PROT 6.9 04/12/2020   ALBUMIN 3.6 04/12/2020   CALCIUM 8.9 04/12/2020   ANIONGAP 8 03/18/2020   GFR 88.41 04/12/2020   Lab Results  Component Value Date   CHOL 218 (H) 04/12/2020   Lab Results  Component Value Date   HDL 50.00 04/12/2020   Lab Results  Component Value Date   LDLCALC 149 (H) 04/12/2020   Lab Results  Component Value Date   TRIG 95.0 04/12/2020   Lab Results  Component Value Date   CHOLHDL 4 04/12/2020   Lab Results  Component Value Date   HGBA1C 5.8 (H) 07/30/2013   Mammogram- Last completed 02/16/2021. Results are normal. Repeat in 1 year.  Colonoscopy- Last completed 09/12/2019. Results showed one 8-10 mm polyp at the hepatic flexure which was removed, one diminutive polyp in the proximal descending colon which was removed, otherwise results are normal.      Assessment & Plan:   Problem List Items Addressed This Visit       Unprioritized   Acute non-recurrent pansinusitis   Relevant  Medications   amoxicillin-clavulanate (AUGMENTIN) 875-125 MG tablet   fluticasone (FLONASE) 50 MCG/ACT nasal spray   fluconazole (DIFLUCAN) 150 MG tablet   predniSONE (DELTASONE) 10 MG tablet   Acute vaginitis   Relevant Medications   fluconazole (DIFLUCAN) 150 MG tablet   Bronchitis   Relevant Medications   amoxicillin-clavulanate (AUGMENTIN) 875-125 MG tablet   predniSONE (DELTASONE) 10 MG tablet   Hyperlipidemia    Encourage heart healthy diet such as MIND or DASH diet, increase exercise, avoid trans fats, simple carbohydrates and processed foods,  consider a krill or fish or flaxseed oil cap daily.        Preventative health care - Primary    ghm utd Check labs' See avs       Relevant Orders   CBC with Differential/Platelet   Comprehensive metabolic panel   Lipid panel   TSH     Meds ordered this encounter  Medications   amoxicillin-clavulanate (AUGMENTIN) 875-125 MG tablet    Sig: Take 1 tablet by mouth 2 (two) times daily.    Dispense:  20 tablet    Refill:  0   fluticasone (FLONASE) 50 MCG/ACT nasal spray    Sig: Place 2 sprays into both nostrils daily.    Dispense:  16 g    Refill:  6   fluconazole (DIFLUCAN) 150 MG tablet    Sig: 1 po x1, may repeat in 3 days prn    Dispense:  2 tablet    Refill:  0   predniSONE (DELTASONE) 10 MG tablet    Sig: TAKE 3 TABLETS PO QD FOR 3 DAYS THEN TAKE 2 TABLETS PO QD FOR 3 DAYS THEN TAKE 1 TABLET PO QD FOR 3 DAYS THEN TAKE 1/2 TAB PO QD FOR 3 DAYS    Dispense:  20 tablet    Refill:  0    I, Dr. Roma Schanz, DO, personally preformed the services described in this documentation.  All medical record entries made by the scribe were at my direction and in my presence.  I have reviewed the chart and discharge instructions (if applicable) and agree that the record reflects my personal performance and is accurate and complete. 03/28/2021   I,Shehryar Baig,acting as a scribe for Ann Held, DO.,have documented all relevant documentation on the behalf of Ann Held, DO,as directed by  Ann Held, DO while in the presence of Ann Held, DO.   Ann Held, DO

## 2021-03-28 NOTE — Assessment & Plan Note (Signed)
ghm utd Check labs  See avs  

## 2021-03-28 NOTE — Assessment & Plan Note (Signed)
Encourage heart healthy diet such as MIND or DASH diet, increase exercise, avoid trans fats, simple carbohydrates and processed foods, consider a krill or fish or flaxseed oil cap daily.  °

## 2021-03-29 LAB — LIPID PANEL
Cholesterol: 215 mg/dL — ABNORMAL HIGH (ref 0–200)
HDL: 43.7 mg/dL (ref >39.00)
LDL Cholesterol: 151 mg/dL — ABNORMAL HIGH (ref 0–99)
NonHDL: 170.99
Total CHOL/HDL Ratio: 5
Triglycerides: 98 mg/dL (ref 0.0–149.0)
VLDL: 19.6 mg/dL (ref 0.0–40.0)

## 2021-03-29 LAB — CBC WITH DIFFERENTIAL/PLATELET
Basophils Absolute: 0 10*3/uL (ref 0.0–0.1)
Basophils Relative: 0.5 % (ref 0.0–3.0)
Eosinophils Absolute: 0.1 10*3/uL (ref 0.0–0.7)
Eosinophils Relative: 1.8 % (ref 0.0–5.0)
HCT: 39.8 % (ref 36.0–46.0)
Hemoglobin: 12.8 g/dL (ref 12.0–15.0)
Lymphocytes Relative: 42.8 % (ref 12.0–46.0)
Lymphs Abs: 2.7 10*3/uL (ref 0.7–4.0)
MCHC: 32.3 g/dL (ref 30.0–36.0)
MCV: 85.4 fl (ref 78.0–100.0)
Monocytes Absolute: 0.5 10*3/uL (ref 0.1–1.0)
Monocytes Relative: 7.6 % (ref 3.0–12.0)
Neutro Abs: 3 10*3/uL (ref 1.4–7.7)
Neutrophils Relative %: 47.3 % (ref 43.0–77.0)
Platelets: 432 10*3/uL — ABNORMAL HIGH (ref 150.0–400.0)
RBC: 4.66 Mil/uL (ref 3.87–5.11)
RDW: 14.6 % (ref 11.5–15.5)
WBC: 6.4 10*3/uL (ref 4.0–10.5)

## 2021-03-29 LAB — COMPREHENSIVE METABOLIC PANEL
ALT: 13 U/L (ref 0–35)
AST: 12 U/L (ref 0–37)
Albumin: 3.6 g/dL (ref 3.5–5.2)
Alkaline Phosphatase: 106 U/L (ref 39–117)
BUN: 13 mg/dL (ref 6–23)
CO2: 31 mEq/L (ref 19–32)
Calcium: 9.1 mg/dL (ref 8.4–10.5)
Chloride: 102 mEq/L (ref 96–112)
Creatinine, Ser: 0.69 mg/dL (ref 0.40–1.20)
GFR: 97.26 mL/min (ref >60.00)
Glucose, Bld: 86 mg/dL (ref 70–99)
Potassium: 3.7 mEq/L (ref 3.5–5.1)
Sodium: 140 mEq/L (ref 135–145)
Total Bilirubin: 0.5 mg/dL (ref 0.2–1.2)
Total Protein: 6.6 g/dL (ref 6.0–8.3)

## 2021-03-29 LAB — TSH: TSH: 1.36 u[IU]/mL (ref 0.35–5.50)

## 2021-03-30 ENCOUNTER — Encounter: Payer: Self-pay | Admitting: Family Medicine

## 2021-03-30 NOTE — Telephone Encounter (Signed)
Pt was seen for this concern on 03/28/21. Please advise

## 2021-03-31 ENCOUNTER — Other Ambulatory Visit: Payer: Self-pay | Admitting: Family Medicine

## 2021-03-31 DIAGNOSIS — R051 Acute cough: Secondary | ICD-10-CM

## 2021-03-31 MED ORDER — PROMETHAZINE-DM 6.25-15 MG/5ML PO SYRP: 5.0000 mL | ORAL_SOLUTION | Freq: Four times a day (QID) | ORAL | 0 refills | Status: DC | PRN

## 2021-05-20 IMAGING — US US PELVIS COMPLETE WITH TRANSVAGINAL
1 series · 13 of 25 positions shown · non-contrast
Comparison: Prior CT from 03/19/2020.

CLINICAL DATA: Initial evaluation for sporadic right lower quadrant
pain. Abnormal CT. History of prior hysterectomy and left
oophorectomy.



[Series 2: us pelvis complete with transvaginal · 13 of 55 slices shown]
[im 1/55]
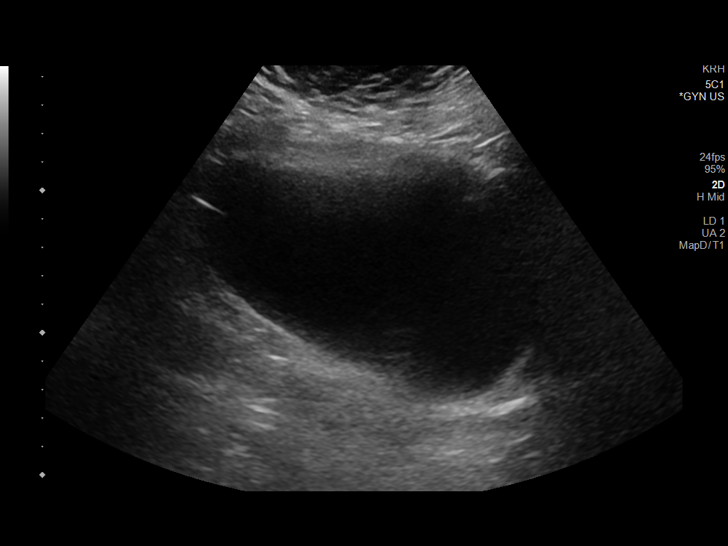
[im 5/55]
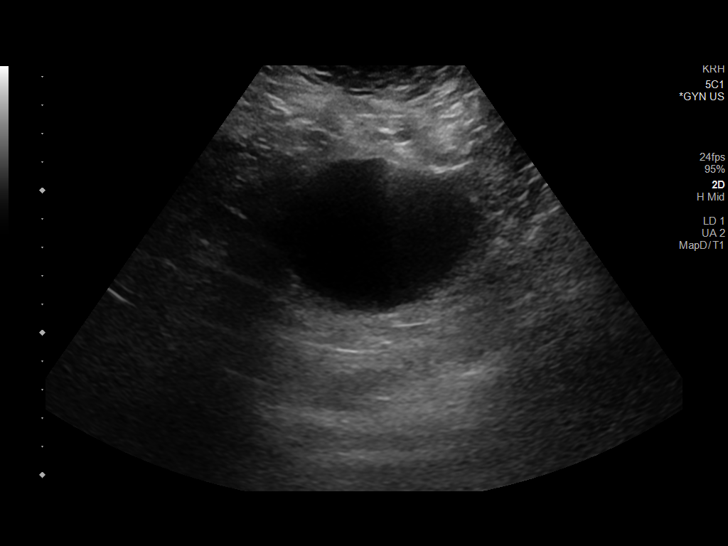
[im 10/55]
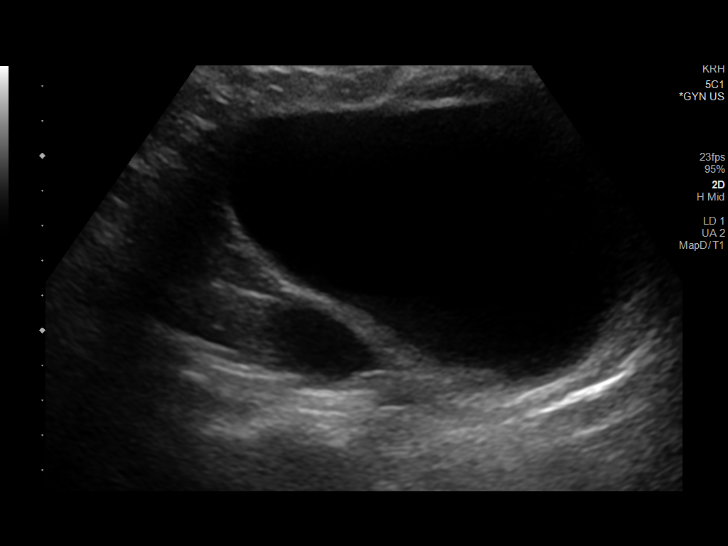
[im 14/55]
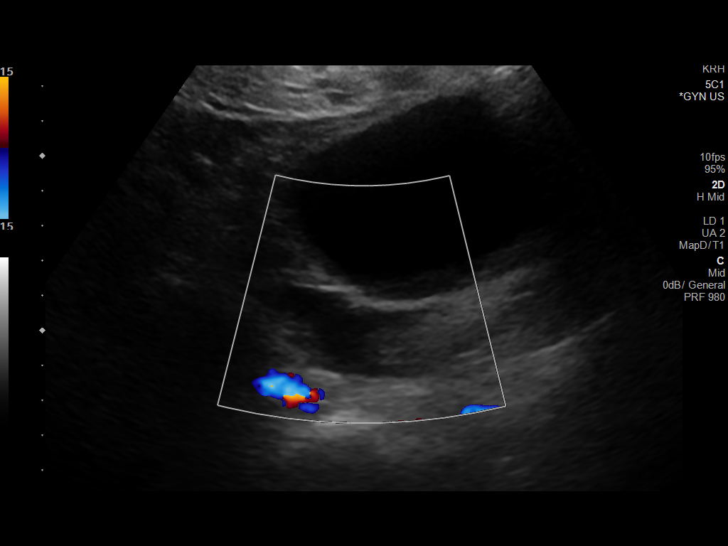
[im 19/55]
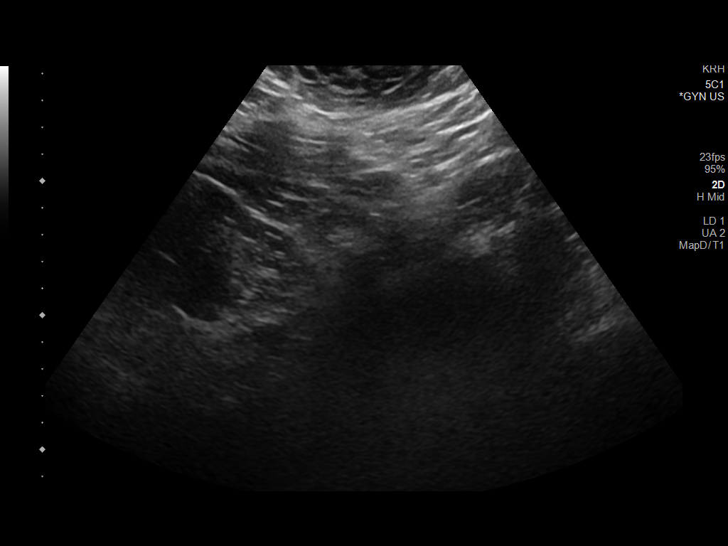
[im 23/55]
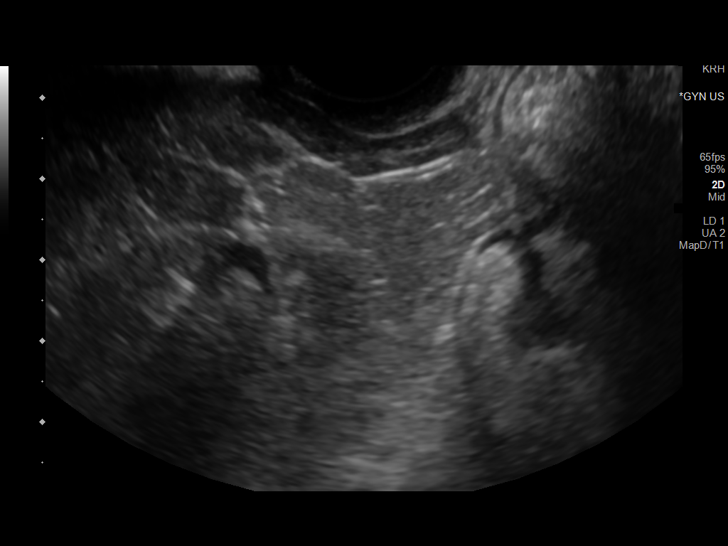
[im 28/55]
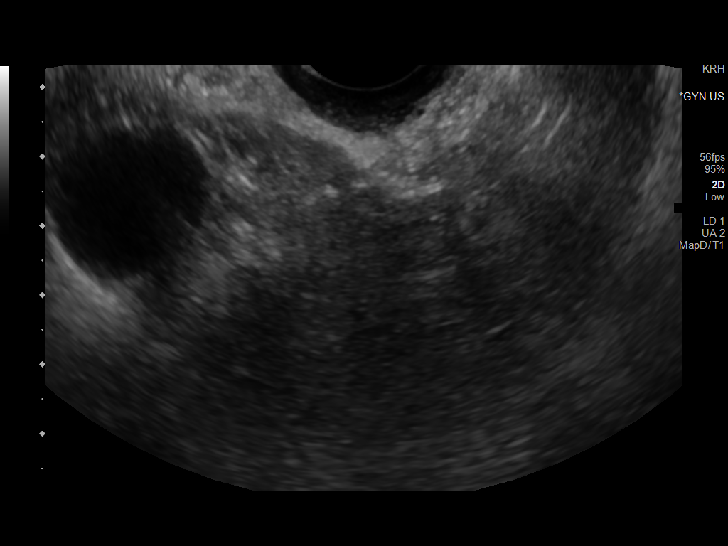
[im 32/55]
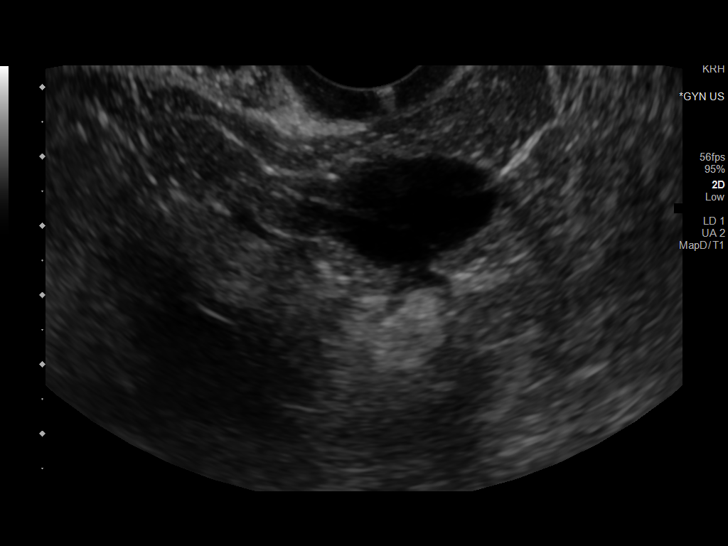
[im 37/55]
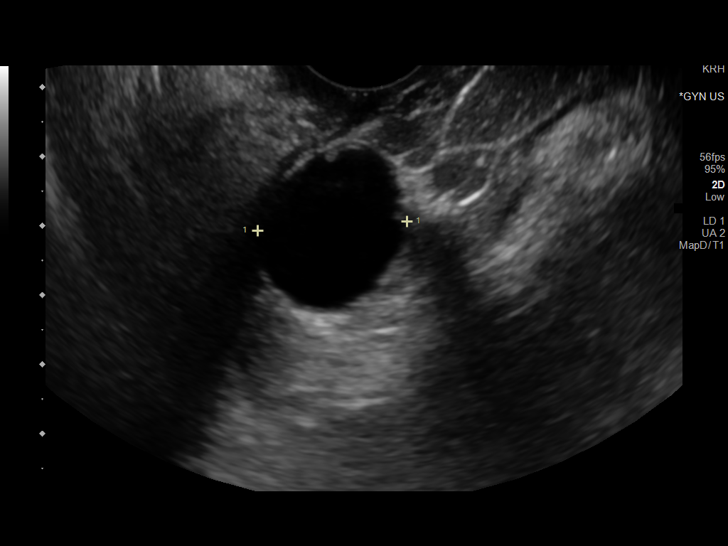
[im 41/55]
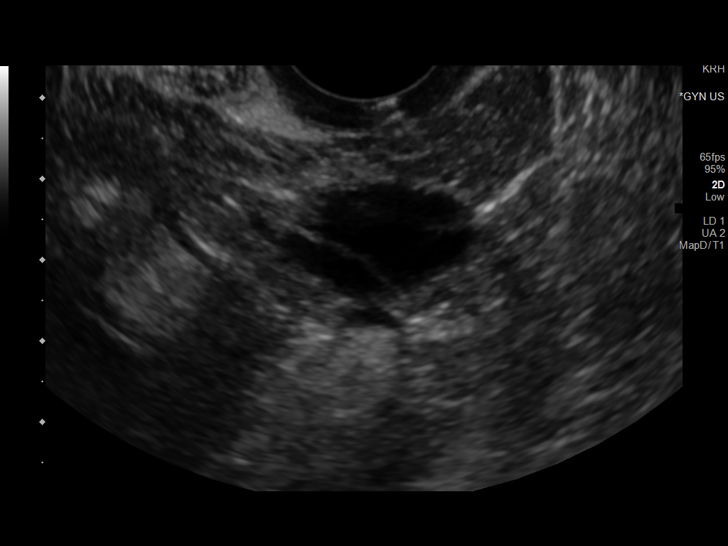
[im 46/55]
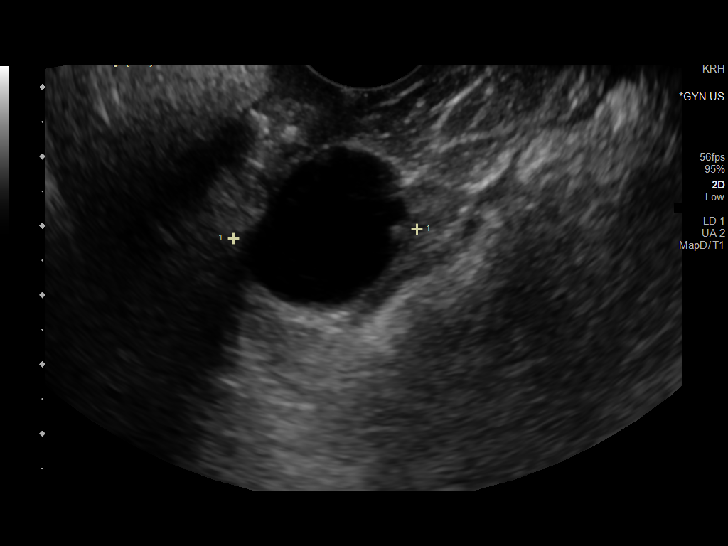
[im 50/55]
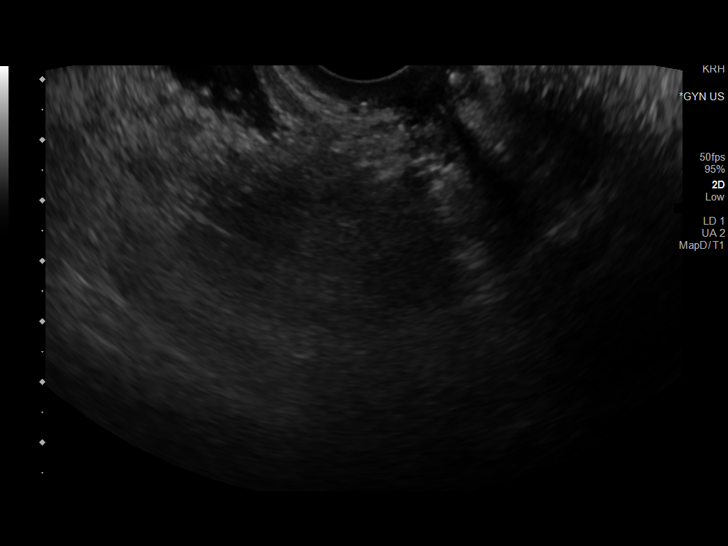
[im 55/55]
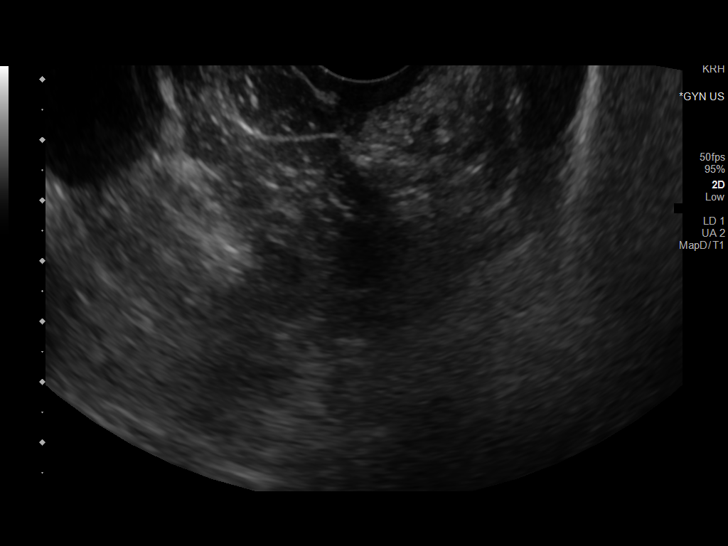

[13 of 25 positions shown; findings below may reference images not displayed]

FINDINGS: Uterus

Prior hysterectomy.  No abnormality about the vaginal cuff.

Endometrium

Prior hysterectomy.

Right ovary

Measurements: 3.8 x 2.4 x 2.7 cm = volume: 12.8 mL. 4.9 x 2.3 x
cm mildly complex cyst seen arising from the right ovary. Single
thin internal septation. Few possible subcentimeter mural based
nodular foci, best appreciated on transverse cine clips. No
associated vascularity.

Left ovary

Not visualized, reportedly surgically absent.  No adnexal mass.

Other findings

No abnormal free fluid.
IMPRESSION: 1. 4.9 x 2.3 x 2.2 cm mildly complex right ovarian cyst with single
thin internal septation and possible subcentimeter mural based
nodular foci, indeterminate. Short interval follow-up ultrasound in
6-12 weeks recommended for further evaluation.
2. Prior hysterectomy and left oophorectomy. No other pelvic or
adnexal mass. No free fluid.

## 2021-06-01 ENCOUNTER — Ambulatory Visit: Payer: No Typology Code available for payment source | Attending: Internal Medicine

## 2021-06-01 ENCOUNTER — Other Ambulatory Visit (HOSPITAL_BASED_OUTPATIENT_CLINIC_OR_DEPARTMENT_OTHER): Payer: Self-pay

## 2021-06-01 DIAGNOSIS — Z23 Encounter for immunization: Secondary | ICD-10-CM

## 2021-06-01 MED ORDER — FLUARIX QUADRIVALENT 0.5 ML IM SUSY
PREFILLED_SYRINGE | INTRAMUSCULAR | 0 refills | Status: DC
  Filled 2021-06-01: qty 0.5, 1d supply, fill #0

## 2021-06-02 ENCOUNTER — Other Ambulatory Visit (HOSPITAL_BASED_OUTPATIENT_CLINIC_OR_DEPARTMENT_OTHER): Payer: Self-pay

## 2021-06-02 MED ORDER — PFIZER COVID-19 VAC BIVALENT 30 MCG/0.3ML IM SUSP
INTRAMUSCULAR | 0 refills | Status: DC
  Filled 2021-06-02: qty 0.3, 1d supply, fill #0

## 2021-06-02 NOTE — Progress Notes (Signed)
° °  Covid-19 Vaccination Clinic  Name:  Jenny Cohen    MRN: [id number] DOB: 1965/06/23  06/02/2021  [name] was observed post Covid-19 immunization for 15 minutes without incident. She was provided with Vaccine Information Sheet and instruction to access the V-Safe system.   [name] was instructed to call 911 with any severe reactions post vaccine: Difficulty breathing  Swelling of face and throat  A fast heartbeat  A bad rash all over body  Dizziness and weakness   Immunizations Administered     Name Date Dose VIS Date Route   Pfizer Covid-19 Vaccine Bivalent Booster 06/01/2021 12:11 PM 0.3 mL 01/26/2021 Intramuscular   Manufacturer: [place]   Lot: [id number]   [place]: [phone]

## 2021-06-30 ENCOUNTER — Encounter: Payer: Self-pay | Admitting: Family Medicine

## 2021-07-04 ENCOUNTER — Encounter: Payer: Self-pay | Admitting: Family Medicine

## 2021-07-04 ENCOUNTER — Ambulatory Visit (INDEPENDENT_AMBULATORY_CARE_PROVIDER_SITE_OTHER): Payer: Self-pay | Admitting: Family Medicine

## 2021-07-04 VITALS — BP 124/71 | HR 77 | Ht 65.0 in | Wt 290.4 lb

## 2021-07-04 DIAGNOSIS — R42 Dizziness and giddiness: Secondary | ICD-10-CM

## 2021-07-04 NOTE — Telephone Encounter (Signed)
Called the pt and scheduled a appt for today with beck

## 2021-07-04 NOTE — Progress Notes (Signed)
Acute Office Visit  Subjective:    Patient ID: Jenny Cohen, female    DOB: 06/08/1965, 56 y.o.   MRN: [id number]  CC: dizziness   HPI Patient is in today for dizziness.  Patient reports about 10 days ago she woke up and sat up in bed, immediately feeling like the room was spinning. The next few days it happened multiple times a day with any head movements and she ended up staying in the bed for 2 days because it was happening so frequently. Rolling over in the bed triggered symptoms as well. She describes the sensation as a spinning that made her nauseous, but she never passed out or felt lightheaded. States she checked her BP and it remained stable. Reports it happened regardless of how much she had eaten, so she did not think it was glucose related - no history of hypo/hyperglycemia.         Past Medical History:  Diagnosis Date   Allergy    Anemia    Arthritis    LEFT SHOULDER   Chicken pox    DUB (dysfunctional uterine bleeding)    Fibroids    Heart murmur    as a child   Hyperlipidemia    Morbid obesity ([place])    BMI 47    Past Surgical History:  Procedure Laterality Date   ABDOMINAL HYSTERECTOMY     CESAREAN SECTION     DILATION AND CURETTAGE OF UTERUS     ENDOMETRIAL ABLATION     LAPAROSCOPY  1990   with laparotomy   PARTIAL HYSTERECTOMY      Family History  Problem Relation Age of Onset   Hyperlipidemia Father    Hypertension Father    Syncope episode Father    Diabetes Mother    Sarcoidosis Mother    Pancreatic cancer Maternal Uncle    Colon cancer Maternal Grandfather    Alzheimer's disease Maternal Grandfather    Alzheimer's disease Maternal Grandmother    Heart failure Paternal Grandmother    Throat cancer Paternal Grandfather    Esophageal cancer Neg Hx    Rectal cancer Neg Hx    Stomach cancer Neg Hx     Social History   Socioeconomic History   Marital status: Single    Spouse name: Not on file   Number of children: Not on  file   Years of education: Not on file   Highest education level: Not on file  Occupational History    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[place]  Tobacco Use   Smoking status: Never   Smokeless tobacco: Never  Vaping Use   Vaping Use: Never used  Substance and Sexual Activity   Alcohol use: Yes    Alcohol/week: 0.0 standard drinks    Comment: social   Drug use: No   Sexual activity: Yes    Partners: Male    Birth control/protection: Condom  Other Topics Concern   Not on file  Social History Narrative   Exercise--= daily 7min 5 days a week    Social Determinants of Health   Financial Resource Strain: Not on file  Food Insecurity: Not on file  Transportation Needs: Not on file  Physical Activity: Not on file  Stress: Not on file  Social Connections: Not on file  Intimate Partner Violence: Not on file    Outpatient Medications Prior to Visit  Medication Sig Dispense Refill   ALPRAZolam (XANAX) 0.25 MG tablet TAKE 1 TABLET(0.25 MG) BY MOUTH THREE TIMES DAILY  AS NEEDED FOR ANXIETY 30 tablet 1   amoxicillin-clavulanate (AUGMENTIN) 875-125 MG tablet Take 1 tablet by mouth 2 (two) times daily. 20 tablet 0   COVID-19 mRNA bivalent vaccine, Pfizer, (PFIZER COVID-19 VAC BIVALENT) injection Inject into the muscle. 0.3 mL 0   fluconazole (DIFLUCAN) 150 MG tablet 1 po x1, may repeat in 3 days prn 2 tablet 0   fluticasone (FLONASE) 50 MCG/ACT nasal spray Place 2 sprays into both nostrils daily. 16 g 6   ibuprofen (ADVIL) 200 MG tablet Take 600-800 mg by mouth every 6 (six) hours as needed for moderate pain.     influenza vac split quadrivalent PF (FLUARIX QUADRIVALENT) 0.5 ML injection Inject into the muscle. 0.5 mL 0   predniSONE (DELTASONE) 10 MG tablet TAKE 3 TABLETS PO QD FOR 3 DAYS THEN TAKE 2 TABLETS PO QD FOR 3 DAYS THEN TAKE 1 TABLET PO QD FOR 3 DAYS THEN TAKE 1/2 TAB PO QD FOR 3 DAYS 20 tablet 0   promethazine-dextromethorphan (PROMETHAZINE-DM) 6.25-15 MG/5ML syrup Take 5 mLs  by mouth 4 (four) times daily as needed. 118 mL 0   No facility-administered medications prior to visit.    Allergies  Allergen Reactions   Miconazole Nitrate Itching   Vagisil Itching    Review of Systems All review of systems negative except what is listed in the HPI     Objective:    Physical Exam Vitals reviewed.  Constitutional:      Appearance: Normal appearance.  HENT:     Head: Normocephalic and atraumatic.     Comments: + Dix-Hallpike, L>R    Right Ear: Tympanic membrane normal.     Left Ear: Tympanic membrane normal.  Cardiovascular:     Rate and Rhythm: Normal rate and regular rhythm.  Musculoskeletal:     Cervical back: Normal range of motion and neck supple. No tenderness.  Lymphadenopathy:     Cervical: No cervical adenopathy.  Skin:    General: Skin is warm and dry.  Neurological:     General: No focal deficit present.     Mental Status: She is alert and oriented to person, place, and time. Mental status is at baseline.  Psychiatric:        Mood and Affect: Mood normal.        Behavior: Behavior normal.        Thought Content: Thought content normal.        Judgment: Judgment normal.    BP 124/71    Pulse 77    Ht 5\' 5"  (1.651 m)    Wt 290 lb 6.4 oz (131.7 kg)    LMP 07/14/2013    SpO2 100%    BMI 48.33 kg/m  Wt Readings from Last 3 Encounters:  07/04/21 290 lb 6.4 oz (131.7 kg)  03/28/21 286 lb (129.7 kg)  04/12/20 299 lb 12.8 oz (136 kg)    Health Maintenance Due  Topic Date Due   Zoster Vaccines- Shingrix (1 of 2) Never done    There are no preventive care reminders to display for this patient.   Lab Results  Component Value Date   TSH 1.36 03/28/2021   Lab Results  Component Value Date   WBC 6.4 03/28/2021   HGB 12.8 03/28/2021   HCT 39.8 03/28/2021   MCV 85.4 03/28/2021   PLT 432.0 (H) 03/28/2021   Lab Results  Component Value Date   NA 140 03/28/2021   K 3.7 03/28/2021   CO2 31 03/28/2021   GLUCOSE  86 03/28/2021   BUN  13 03/28/2021   CREATININE 0.69 03/28/2021   BILITOT 0.5 03/28/2021   ALKPHOS 106 03/28/2021   AST 12 03/28/2021   ALT 13 03/28/2021   PROT 6.6 03/28/2021   ALBUMIN 3.6 03/28/2021   CALCIUM 9.1 03/28/2021   ANIONGAP 8 03/18/2020   GFR 97.26 03/28/2021   Lab Results  Component Value Date   CHOL 215 (H) 03/28/2021   Lab Results  Component Value Date   HDL 43.70 03/28/2021   Lab Results  Component Value Date   LDLCALC 151 (H) 03/28/2021   Lab Results  Component Value Date   TRIG 98.0 03/28/2021   Lab Results  Component Value Date   CHOLHDL 5 03/28/2021   Lab Results  Component Value Date   HGBA1C 5.8 (H) 07/30/2013       Assessment & Plan:   1. Vertigo Start home exercises. Let me know if you want formal physical therapy.  You deferred Meclizine prescription at this time - let me know if you change your mind. You can also try over-the-counter Dramamine (do not drive, this may make you drowsy).  Patient aware of signs/symptoms requiring further/urgent evaluation.   Follow-up if symptoms worsen or fail to improve     Terrilyn Saver, NP

## 2021-07-04 NOTE — Patient Instructions (Signed)
Start home exercises. Let me know if you want formal physical therapy.  You deferred Meclizine prescription at this time - let me know if you change your mind. You can also try over-the-counter Dramamine (do not drive, this may make you drowsy).  Please contact office for follow-up if symptoms do not improve or worsen. Seek emergency care if symptoms become severe.

## 2021-11-03 ENCOUNTER — Ambulatory Visit: Payer: Self-pay | Admitting: Family Medicine

## 2021-11-08 ENCOUNTER — Ambulatory Visit: Payer: Self-pay | Admitting: Family Medicine

## 2022-02-24 ENCOUNTER — Telehealth: Payer: Self-pay | Admitting: Family Medicine

## 2022-02-24 NOTE — Telephone Encounter (Signed)
Patient returned call from [place]. She stated that the chest pains were not serious. She feels it randomly and it's almost like a catch that pulls around her rib cage. Patient knows to go to ED if it becomes concerning or to call us if it she wants to be seen sooner.

## 2022-02-24 NOTE — Telephone Encounter (Signed)
Can we get patient in earlier before appt in November please?

## 2022-02-27 ENCOUNTER — Telehealth (INDEPENDENT_AMBULATORY_CARE_PROVIDER_SITE_OTHER): Payer: Self-pay | Admitting: Family Medicine

## 2022-02-27 ENCOUNTER — Encounter: Payer: Self-pay | Admitting: Family Medicine

## 2022-02-27 VITALS — Ht 65.0 in | Wt 290.4 lb

## 2022-02-27 DIAGNOSIS — U071 COVID-19: Secondary | ICD-10-CM

## 2022-02-27 MED ORDER — NIRMATRELVIR/RITONAVIR (PAXLOVID)TABLET: 3.0000 | ORAL_TABLET | Freq: Two times a day (BID) | ORAL | 0 refills | Status: AC

## 2022-02-27 NOTE — Progress Notes (Signed)
Patient ID: LOYS HOSELTON, female   DOB: [birth year], 56 y.o.   MRN: [id number]  Virtual Visit via Video Note  I connected with Jenny Cohen on 02/27/22 at  3:45 PM EDT by a video enabled telemedicine application and verified that I am speaking with the correct person using two identifiers.  Location patient: home Location provider:work or home office Persons participating in the virtual visit: patient, provider  I discussed the limitations of evaluation and management by telemedicine and the availability of in person appointments. The patient expressed understanding and agreed to proceed.   HPI: Jenny Cohen has COVID infection.  She states that she woke up Saturday with headache and chills and productive cough.  She noticed a little bit of nasal congestion late Friday along with some mild sore throat.  She went to pharmacy earlier today and tested positive for COVID and negative for influenza.  She states she had COVID a couple times previously.  Generally healthy.  No chronic lung or heart problems.  Takes no regular medications.  Took Paxlovid once previously and tolerated without difficulty.   ROS: See pertinent positives and negatives per HPI.  Past Medical History:  Diagnosis Date   Allergy    Anemia    Arthritis    LEFT SHOULDER   Chicken pox    DUB (dysfunctional uterine bleeding)    Fibroids    Heart murmur    as a child   Hyperlipidemia    Morbid obesity ([place])    BMI 47    Past Surgical History:  Procedure Laterality Date   ABDOMINAL HYSTERECTOMY     CESAREAN SECTION     DILATION AND CURETTAGE OF UTERUS     ENDOMETRIAL ABLATION     LAPAROSCOPY  1990   with laparotomy   PARTIAL HYSTERECTOMY      Family History  Problem Relation Age of Onset   Hyperlipidemia Father    Hypertension Father    Syncope episode Father    Diabetes Mother    Sarcoidosis Mother    Pancreatic cancer Maternal Uncle    Colon cancer Maternal Grandfather    Alzheimer's disease Maternal  Grandfather    Alzheimer's disease Maternal Grandmother    Heart failure Paternal Grandmother    Throat cancer Paternal Grandfather    Esophageal cancer Neg Hx    Rectal cancer Neg Hx    Stomach cancer Neg Hx     SOCIAL HX:   Non-smoker   Current Outpatient Medications:    ALPRAZolam (XANAX) 0.25 MG tablet, TAKE 1 TABLET(0.25 MG) BY MOUTH THREE TIMES DAILY AS NEEDED FOR ANXIETY, Disp: 30 tablet, Rfl: 1   nirmatrelvir/ritonavir EUA (PAXLOVID) 20 x 150 MG & 10 x '100MG'$  TABS, Take 3 tablets by mouth 2 (two) times daily for 5 days. (Take nirmatrelvir 150 mg two tablets twice daily for 5 days and ritonavir 100 mg one tablet twice daily for 5 days) Patient GFR is >60, Disp: 30 tablet, Rfl: 0  EXAM:  VITALS per patient if applicable:  GENERAL: alert, oriented, appears well and in no acute distress  HEENT: atraumatic, conjunttiva clear, no obvious abnormalities on inspection of external nose and ears  NECK: normal movements of the head and neck  LUNGS: on inspection no signs of respiratory distress, breathing rate appears normal, no obvious gross SOB, gasping or wheezing  CV: no obvious cyanosis  MS: moves all visible extremities without noticeable abnormality  PSYCH/NEURO: pleasant and cooperative, no obvious depression or anxiety, speech and thought processing grossly  intact  ASSESSMENT AND PLAN:  Discussed the following assessment and plan:  COVID infection.  Patient has had COVID 2 times previously.  She is on day 3-4 of symptoms.  She would like to start antiviral therapy.  Sent in Paxlovid 3 capsules twice daily for 5 days.  Recommend monitoring O2 sats with pulse oximeter and be in touch if falling down 90% or lower.  Patient aware of isolation recommendations.  Follow-up with primary for any persistent or worsening symptoms     I discussed the assessment and treatment plan with the patient. The patient was provided an opportunity to ask questions and all were answered. The  patient agreed with the plan and demonstrated an understanding of the instructions.   The patient was advised to call back or seek an in-person evaluation if the symptoms worsen or if the condition fails to improve as anticipated.     Carolann Littler, MD

## 2022-03-07 ENCOUNTER — Encounter: Payer: Self-pay | Admitting: Family Medicine

## 2022-03-09 ENCOUNTER — Encounter: Payer: Self-pay | Admitting: Family Medicine

## 2022-03-09 ENCOUNTER — Ambulatory Visit (INDEPENDENT_AMBULATORY_CARE_PROVIDER_SITE_OTHER): Payer: Self-pay | Admitting: Family Medicine

## 2022-03-09 VITALS — BP 110/60 | HR 78 | Temp 97.8°F | Resp 20 | Ht 65.0 in | Wt 287.8 lb

## 2022-03-09 DIAGNOSIS — J40 Bronchitis, not specified as acute or chronic: Secondary | ICD-10-CM

## 2022-03-09 MED ORDER — AZITHROMYCIN 250 MG PO TABS: ORAL_TABLET | ORAL | 0 refills | Status: DC

## 2022-03-09 MED ORDER — PREDNISONE 10 MG PO TABS: ORAL_TABLET | ORAL | 0 refills | Status: DC

## 2022-03-09 MED ORDER — FLUCONAZOLE 150 MG PO TABS: ORAL_TABLET | ORAL | 0 refills | Status: DC

## 2022-03-09 MED ORDER — PROMETHAZINE-DM 6.25-15 MG/5ML PO SYRP: 5.0000 mL | ORAL_SOLUTION | Freq: Four times a day (QID) | ORAL | 0 refills | Status: DC | PRN

## 2022-03-09 NOTE — Progress Notes (Signed)
Subjective:   By signing my name below, I, Shehryar Baig, attest that this documentation has been prepared under the direction and in the presence of Ann Held, DO. 03/09/2022    Patient ID: Jenny Cohen, female    DOB: July 30, 1965, 56 y.o.   MRN: [id number]  Chief Complaint  Patient presents with   Covid Positive    Pt states tested positive on Oct 2nd and tested negative on Oct 8 and was negative. Pt states states cough has not gotten better.     HPI Patient is in today for a office visit.   She complains of coughing and hoarse voice for the past week. She tested positive on 02/27/2022 and was given an anti-viral medication to manage her symptoms. She denies having any fevers. She has occasional headaches. She is coughing up dark-light green phlegm. She has occasional wheezing at night. She was not given any cough medication following her Covid-19 diagnosis.    Past Medical History:  Diagnosis Date   Allergy    Anemia    Arthritis    LEFT SHOULDER   Chicken pox    DUB (dysfunctional uterine bleeding)    Fibroids    Heart murmur    as a child   Hyperlipidemia    Morbid obesity ([place])    BMI 47    Past Surgical History:  Procedure Laterality Date   ABDOMINAL HYSTERECTOMY     CESAREAN SECTION     DILATION AND CURETTAGE OF UTERUS     ENDOMETRIAL ABLATION     LAPAROSCOPY  1990   with laparotomy   PARTIAL HYSTERECTOMY      Family History  Problem Relation Age of Onset   Hyperlipidemia Father    Hypertension Father    Syncope episode Father    Diabetes Mother    Sarcoidosis Mother    Pancreatic cancer Maternal Uncle    Colon cancer Maternal Grandfather    Alzheimer's disease Maternal Grandfather    Alzheimer's disease Maternal Grandmother    Heart failure Paternal Grandmother    Throat cancer Paternal Grandfather    Esophageal cancer Neg Hx    Rectal cancer Neg Hx    Stomach cancer Neg Hx     Social History   Socioeconomic History    Marital status: Single    Spouse name: Not on file   Number of children: Not on file   Years of education: Not on file   Highest education level: Not on file  Occupational History    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[place]  Tobacco Use   Smoking status: Never   Smokeless tobacco: Never  Vaping Use   Vaping Use: Never used  Substance and Sexual Activity   Alcohol use: Yes    Alcohol/week: 0.0 standard drinks of alcohol    Comment: social   Drug use: No   Sexual activity: Yes    Partners: Male    Birth control/protection: Condom  Other Topics Concern   Not on file  Social History Narrative   Exercise--= daily 73mn 5 days a week    Social Determinants of Health   Financial Resource Strain: Not on file  Food Insecurity: Not on file  Transportation Needs: Not on file  Physical Activity: Insufficiently Active (07/09/2017)   Exercise Vital Sign    Days of Exercise per Week: 2 days    Minutes of Exercise per Session: 50 min  Stress: No Stress Concern Present (07/09/2017)   [employer]  Occupational Health - Occupational Stress Questionnaire    Feeling of Stress : Not at all  Social Connections: Not on file  Intimate Partner Violence: Not on file    Outpatient Medications Prior to Visit  Medication Sig Dispense Refill   ALPRAZolam (XANAX) 0.25 MG tablet TAKE 1 TABLET(0.25 MG) BY MOUTH THREE TIMES DAILY AS NEEDED FOR ANXIETY 30 tablet 1   No facility-administered medications prior to visit.    Allergies  Allergen Reactions   Miconazole Nitrate Itching   Vagisil Itching    Review of Systems  Constitutional:  Negative for fever and malaise/fatigue.  HENT:  Negative for congestion.        (+)hoarse voice  Eyes:  Negative for blurred vision.  Respiratory:  Positive for cough, sputum production (green) and wheezing. Negative for shortness of breath.   Cardiovascular:  Negative for chest pain, palpitations and leg swelling.  Gastrointestinal:  Negative for  abdominal pain, blood in stool and nausea.  Genitourinary:  Negative for dysuria and frequency.  Musculoskeletal:  Negative for falls.  Skin:  Negative for rash.  Neurological:  Positive for headaches. Negative for dizziness and loss of consciousness.  Endo/Heme/Allergies:  Negative for environmental allergies.  Psychiatric/Behavioral:  Negative for depression. The patient is not nervous/anxious.        Objective:    Physical Exam Vitals and nursing note reviewed.  Constitutional:      General: She is not in acute distress.    Appearance: Normal appearance. She is not ill-appearing.  HENT:     Head: Normocephalic and atraumatic.     Right Ear: External ear normal.     Left Ear: External ear normal.     Nose: Rhinorrhea present. No congestion.  Eyes:     Extraocular Movements: Extraocular movements intact.     Pupils: Pupils are equal, round, and reactive to light.  Cardiovascular:     Rate and Rhythm: Normal rate and regular rhythm.     Heart sounds: Normal heart sounds. No murmur heard.    No gallop.  Pulmonary:     Effort: Pulmonary effort is normal.     Breath sounds: Decreased breath sounds and wheezing present. No rales.  Musculoskeletal:     Cervical back: No tenderness.  Lymphadenopathy:     Cervical: No cervical adenopathy.  Skin:    General: Skin is warm and dry.  Neurological:     Mental Status: She is alert and oriented to person, place, and time.  Psychiatric:        Judgment: Judgment normal.     BP 110/60 (BP Location: Left Arm, Patient Position: Sitting, Cuff Size: Large)   Pulse 78   Temp 97.8 F (36.6 C) (Oral)   Resp 20   Ht '5\' 5"'$  (1.651 m)   Wt 287 lb 12.8 oz (130.5 kg)   LMP 07/14/2013   SpO2 98%   BMI 47.89 kg/m  Wt Readings from Last 3 Encounters:  03/09/22 287 lb 12.8 oz (130.5 kg)  02/27/22 290 lb 6.4 oz (131.7 kg)  07/04/21 290 lb 6.4 oz (131.7 kg)    Diabetic Foot Exam - Simple   No data filed    Lab Results  Component Value  Date   WBC 6.4 03/28/2021   HGB 12.8 03/28/2021   HCT 39.8 03/28/2021   PLT 432.0 (H) 03/28/2021   GLUCOSE 86 03/28/2021   CHOL 215 (H) 03/28/2021   TRIG 98.0 03/28/2021   HDL 43.70 03/28/2021   LDLCALC 151 (H) 03/28/2021  ALT 13 03/28/2021   AST 12 03/28/2021   NA 140 03/28/2021   K 3.7 03/28/2021   CL 102 03/28/2021   CREATININE 0.69 03/28/2021   BUN 13 03/28/2021   CO2 31 03/28/2021   TSH 1.36 03/28/2021   HGBA1C 5.8 (H) 07/30/2013    Lab Results  Component Value Date   TSH 1.36 03/28/2021   Lab Results  Component Value Date   WBC 6.4 03/28/2021   HGB 12.8 03/28/2021   HCT 39.8 03/28/2021   MCV 85.4 03/28/2021   PLT 432.0 (H) 03/28/2021   Lab Results  Component Value Date   NA 140 03/28/2021   K 3.7 03/28/2021   CO2 31 03/28/2021   GLUCOSE 86 03/28/2021   BUN 13 03/28/2021   CREATININE 0.69 03/28/2021   BILITOT 0.5 03/28/2021   ALKPHOS 106 03/28/2021   AST 12 03/28/2021   ALT 13 03/28/2021   PROT 6.6 03/28/2021   ALBUMIN 3.6 03/28/2021   CALCIUM 9.1 03/28/2021   ANIONGAP 8 03/18/2020   GFR 97.26 03/28/2021   Lab Results  Component Value Date   CHOL 215 (H) 03/28/2021   Lab Results  Component Value Date   HDL 43.70 03/28/2021   Lab Results  Component Value Date   LDLCALC 151 (H) 03/28/2021   Lab Results  Component Value Date   TRIG 98.0 03/28/2021   Lab Results  Component Value Date   CHOLHDL 5 03/28/2021   Lab Results  Component Value Date   HGBA1C 5.8 (H) 07/30/2013       Assessment & Plan:   Problem List Items Addressed This Visit       Unprioritized   Bronchitis - Primary   Relevant Medications   azithromycin (ZITHROMAX Z-PAK) 250 MG tablet   promethazine-dextromethorphan (PROMETHAZINE-DM) 6.25-15 MG/5ML syrup   fluconazole (DIFLUCAN) 150 MG tablet   predniSONE (DELTASONE) 10 MG tablet     Meds ordered this encounter  Medications   azithromycin (ZITHROMAX Z-PAK) 250 MG tablet    Sig: As directed    Dispense:  6  each    Refill:  0   promethazine-dextromethorphan (PROMETHAZINE-DM) 6.25-15 MG/5ML syrup    Sig: Take 5 mLs by mouth 4 (four) times daily as needed.    Dispense:  118 mL    Refill:  0   fluconazole (DIFLUCAN) 150 MG tablet    Sig: 1 po x1, may repeat in 3 days prn    Dispense:  2 tablet    Refill:  0   predniSONE (DELTASONE) 10 MG tablet    Sig: TAKE 3 TABLETS PO QD FOR 3 DAYS THEN TAKE 2 TABLETS PO QD FOR 3 DAYS THEN TAKE 1 TABLET PO QD FOR 3 DAYS THEN TAKE 1/2 TAB PO QD FOR 3 DAYS    Dispense:  20 tablet    Refill:  0    I, Ann Held, DO, personally preformed the services described in this documentation.  All medical record entries made by the scribe were at my direction and in my presence.  I have reviewed the chart and discharge instructions (if applicable) and agree that the record reflects my personal performance and is accurate and complete. 03/09/2022   I,Shehryar Baig,acting as a scribe for Ann Held, DO.,have documented all relevant documentation on the behalf of Ann Held, DO,as directed by  Ann Held, DO while in the presence of Ann Held, DO.   Ann Held, DO

## 2022-03-09 NOTE — Patient Instructions (Signed)

## 2022-03-09 NOTE — Assessment & Plan Note (Signed)
z pak  pred taper and cough meds  cxr if no improvement

## 2022-03-31 ENCOUNTER — Encounter: Payer: Self-pay | Admitting: Family Medicine

## 2022-04-06 ENCOUNTER — Encounter: Payer: Self-pay | Admitting: Family Medicine

## 2022-04-07 ENCOUNTER — Encounter: Payer: Self-pay | Admitting: Family Medicine

## 2022-05-12 ENCOUNTER — Encounter: Payer: Self-pay | Admitting: Family Medicine

## 2022-05-12 ENCOUNTER — Ambulatory Visit (INDEPENDENT_AMBULATORY_CARE_PROVIDER_SITE_OTHER): Payer: Self-pay | Admitting: Family Medicine

## 2022-05-12 VITALS — BP 102/60 | HR 70 | Temp 98.3°F | Resp 18 | Ht 65.0 in | Wt 294.4 lb

## 2022-05-12 DIAGNOSIS — Z0000 Encounter for general adult medical examination without abnormal findings: Secondary | ICD-10-CM

## 2022-05-12 DIAGNOSIS — F419 Anxiety disorder, unspecified: Secondary | ICD-10-CM

## 2022-05-12 DIAGNOSIS — Z23 Encounter for immunization: Secondary | ICD-10-CM

## 2022-05-12 MED ORDER — ALPRAZOLAM 0.25 MG PO TABS: ORAL_TABLET | ORAL | 1 refills | Status: DC

## 2022-05-12 NOTE — Patient Instructions (Signed)
Preventive Care 40-57 Years Old, Female Preventive care refers to lifestyle choices and visits with your health care provider that can promote health and wellness. Preventive care visits are also called wellness exams. What can I expect for my preventive care visit? Counseling Your health care provider may ask you questions about your: Medical history, including: Past medical problems. Family medical history. Pregnancy history. Current health, including: Menstrual cycle. Method of birth control. Emotional well-being. Home life and relationship well-being. Sexual activity and sexual health. Lifestyle, including: Alcohol, nicotine or tobacco, and drug use. Access to firearms. Diet, exercise, and sleep habits. Work and work environment. Sunscreen use. Safety issues such as seatbelt and bike helmet use. Physical exam Your health care provider will check your: Height and weight. These may be used to calculate your BMI (body mass index). BMI is a measurement that tells if you are at a healthy weight. Waist circumference. This measures the distance around your waistline. This measurement also tells if you are at a healthy weight and may help predict your risk of certain diseases, such as type 2 diabetes and high blood pressure. Heart rate and blood pressure. Body temperature. Skin for abnormal spots. What immunizations do I need?  Vaccines are usually given at various ages, according to a schedule. Your health care provider will recommend vaccines for you based on your age, medical history, and lifestyle or other factors, such as travel or where you work. What tests do I need? Screening Your health care provider may recommend screening tests for certain conditions. This may include: Lipid and cholesterol levels. Diabetes screening. This is done by checking your blood sugar (glucose) after you have not eaten for a while (fasting). Pelvic exam and Pap test. Hepatitis B test. Hepatitis C  test. HIV (human immunodeficiency virus) test. STI (sexually transmitted infection) testing, if you are at risk. Lung cancer screening. Colorectal cancer screening. Mammogram. Talk with your health care provider about when you should start having regular mammograms. This may depend on whether you have a family history of breast cancer. BRCA-related cancer screening. This may be done if you have a family history of breast, ovarian, tubal, or peritoneal cancers. Bone density scan. This is done to screen for osteoporosis. Talk with your health care provider about your test results, treatment options, and if necessary, the need for more tests. Follow these instructions at home: Eating and drinking  Eat a diet that includes fresh fruits and vegetables, whole grains, lean protein, and low-fat dairy products. Take vitamin and mineral supplements as recommended by your health care provider. Do not drink alcohol if: Your health care provider tells you not to drink. You are pregnant, may be pregnant, or are planning to become pregnant. If you drink alcohol: Limit how much you have to 0-1 drink a day. Know how much alcohol is in your drink. In the [place] one drink equals one 12 oz bottle of beer (355 mL), one 5 oz glass of wine (148 mL), or one 1 oz glass of hard liquor (44 mL). Lifestyle Brush your teeth every morning and night with fluoride toothpaste. Floss one time each day. Exercise for at least 30 minutes 5 or more days each week. Do not use any products that contain nicotine or tobacco. These products include cigarettes, chewing tobacco, and vaping devices, such as e-cigarettes. If you need help quitting, ask your health care provider. Do not use drugs. If you are sexually active, practice safe sex. Use a condom or other form of protection to   prevent STIs. If you do not wish to become pregnant, use a form of birth control. If you plan to become pregnant, see your health care provider for a  prepregnancy visit. Take aspirin only as told by your health care provider. Make sure that you understand how much to take and what form to take. Work with your health care provider to find out whether it is safe and beneficial for you to take aspirin daily. Find healthy ways to manage stress, such as: Meditation, yoga, or listening to music. Journaling. Talking to a trusted person. Spending time with friends and family. Minimize exposure to UV radiation to reduce your risk of skin cancer. Safety Always wear your seat belt while driving or riding in a vehicle. Do not drive: If you have been drinking alcohol. Do not ride with someone who has been drinking. When you are tired or distracted. While texting. If you have been using any mind-altering substances or drugs. Wear a helmet and other protective equipment during sports activities. If you have firearms in your house, make sure you follow all gun safety procedures. Seek help if you have been physically or sexually abused. What's next? Visit your health care provider once a year for an annual wellness visit. Ask your health care provider how often you should have your eyes and teeth checked. Stay up to date on all vaccines. This information is not intended to replace advice given to you by your health care provider. Make sure you discuss any questions you have with your health care provider. Document Revised: 11/10/2020 Document Reviewed: 11/10/2020 Elsevier Patient Education  [place].

## 2022-05-12 NOTE — Progress Notes (Unsigned)
Subjective:     Jenny Cohen is a 56 y.o. female and is here for a comprehensive physical exam. The patient reports no problems.  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[place]  Tobacco Use   Smoking status: Never   Smokeless tobacco: Never  Vaping Use   Vaping Use: Never used  Substance and Sexual Activity   Alcohol use: Yes    Alcohol/week: 0.0 standard drinks of alcohol    Comment: social   Drug use: No   Sexual activity: Yes    Partners: Male    Birth control/protection: Condom  Other Topics Concern   Not on file  Social History Narrative   Exercise--= daily 22mn 5 days a week    Social Determinants of Health   Financial Resource Strain: Not on file  Food Insecurity: Not on file  Transportation Needs: Not on file  Physical Activity: Insufficiently Active (07/09/2017)   Exercise Vital Sign    Days of Exercise per Week: 2 days    Minutes of Exercise per Session: 50 min  Stress: No Stress Concern Present (07/09/2017)   [place] Stress : Not at all  Social Connections: Not on file  Intimate Partner Violence: Not on file   Health Maintenance  Topic Date Due   Zoster Vaccines- Shingrix (1 of 2) Never done   INFLUENZA VACCINE  12/27/2021   COVID-19 Vaccine (4 - 2023-24 season) 01/27/2022   MAMMOGRAM  02/16/2022   PAP SMEAR-Modifier  08/13/2022   COLONOSCOPY (Pts [phone] coverage will need to be confirmed)  09/12/2022   DTaP/Tdap/Td (2 - Td or Tdap) 12/21/2024   Hepatitis C Screening  Completed   HIV Screening  Completed   HPV VACCINES  Aged Out    The following portions of the patient's history were reviewed and updated as appropriate: She  has a  past medical history of Allergy, Anemia, Arthritis, Chicken pox, DUB (dysfunctional uterine bleeding), Fibroids, Heart murmur, Hyperlipidemia, and Morbid obesity ([place] She does not have any pertinent problems on file. She  has a past surgical history that includes Cesarean section; Endometrial ablation; laparoscopy (1990); Partial hysterectomy; Dilation and curettage of uterus; and Abdominal hysterectomy. Her family history includes Alzheimer's disease in her maternal grandfather and maternal grandmother; Colon cancer in her maternal grandfather; Diabetes in her mother; Heart failure in her paternal grandmother; Hyperlipidemia in her father; Hypertension in her father; Pancreatic cancer in her maternal uncle; Sarcoidosis in her mother; Syncope episode in her father; Throat cancer in her paternal grandfather. She  reports that she has never smoked. She has never used smokeless tobacco. She reports current alcohol use. She reports that she does not use drugs. She has a current medication list which includes the following prescription(s): alprazolam, fluconazole, and promethazine-dextromethorphan. Current Outpatient Medications on File Prior to Visit  Medication Sig Dispense Refill   fluconazole (DIFLUCAN) 150 MG tablet 1 po x1, may repeat in 3 days prn (Patient not taking: Reported on 05/12/2022) 2 tablet 0   promethazine-dextromethorphan (PROMETHAZINE-DM) 6.25-15 MG/5ML syrup Take 5 mLs by mouth 4 (four) times daily as needed. (Patient not taking: Reported on 05/12/2022) 118 mL 0  No current facility-administered medications on file prior to visit.   She is allergic to miconazole nitrate and vagisil..  Review of Systems   Objective:  Review of Systems  Constitutional: Negative for activity change, appetite change and fatigue.  HENT: Negative for hearing loss, congestion, tinnitus and ear discharge.  dentist q16mEyes: Negative for visual disturbance (see optho q1y -- vision corrected to 20/20  with glasses).  Respiratory: Negative for cough, chest tightness and shortness of breath.   Cardiovascular: Negative for chest pain, palpitations and leg swelling.  Gastrointestinal: Negative for abdominal pain, diarrhea, constipation and abdominal distention.  Genitourinary: Negative for urgency, frequency, decreased urine volume and difficulty urinating.  Musculoskeletal: Negative for back pain, arthralgias and gait problem.  Skin: Negative for color change, pallor and rash.  Neurological: Negative for dizziness, light-headedness, numbness and headaches.  Hematological: Negative for adenopathy. Does not bruise/bleed easily.  Psychiatric/Behavioral: Negative for suicidal ideas, confusion, sleep disturbance, self-injury, dysphoric mood, decreased concentration and agitation.      BP 102/60 (BP Location: Left Arm, Patient Position: Sitting, Cuff Size: Large)   Pulse 70   Temp 98.3 F (36.8 C) (Oral)   Resp 18   Ht '5\' 5"'$  (1.651 m)   Wt 294 lb 6.4 oz (133.5 kg)   LMP 07/14/2013   SpO2 97%   BMI 48.99 kg/m  General appearance: alert, cooperative, appears stated age, and no distress Head: Normocephalic, without obvious abnormality, atraumatic Eyes: conjunctivae/corneas clear. PERRL, EOM's intact. Fundi benign. Ears: normal TM's and external ear canals both ears Nose: Nares normal. Septum midline. Mucosa normal. No drainage or sinus tenderness. Throat: lips, mucosa, and tongue normal; teeth and gums normal Neck: no adenopathy, no carotid bruit, no JVD, supple, symmetrical, trachea midline, and thyroid not enlarged, symmetric, no tenderness/mass/nodules Back: symmetric, no curvature. ROM normal. No CVA tenderness. Lungs: clear to auscultation bilaterally Heart: regular rate and rhythm, S1, S2 normal, no murmur, click, rub or gallop Abdomen: soft, non-tender; bowel sounds normal; no masses,  no organomegaly Extremities: extremities normal, atraumatic, no cyanosis or edema Pulses: 2+ and  symmetric Skin: {skin exam:31329::"Skin color, texture, turgor normal. No rashes or lesions"} Lymph nodes: {lymph node exam:14039::"Cervical, supraclavicular, and axillary nodes normal."} Neurologic: {neuro exam:17854}    Assessment:    Healthy female exam. ***     Plan:     See After Visit Summary for Counseling Recommendations

## 2022-05-13 LAB — LIPID PANEL
Cholesterol: 205 mg/dL — ABNORMAL HIGH (ref <200)
HDL: 56 mg/dL (ref >=50)
LDL Cholesterol (Calc): 127 mg/dL (calc) — ABNORMAL HIGH
Non-HDL Cholesterol (Calc): 149 mg/dL (calc) — ABNORMAL HIGH (ref <130)
Total CHOL/HDL Ratio: 3.7 (calc) (ref <5.0)
Triglycerides: 113 mg/dL (ref <150)

## 2022-05-13 LAB — CBC WITH DIFFERENTIAL/PLATELET
Absolute Monocytes: 470 cells/uL (ref 200–950)
Basophils Absolute: 52 cells/uL (ref 0–200)
Basophils Relative: 0.9 %
Eosinophils Absolute: 139 cells/uL (ref 15–500)
Eosinophils Relative: 2.4 %
HCT: 38.1 % (ref 35.0–45.0)
Hemoglobin: 12.6 g/dL (ref 11.7–15.5)
Lymphs Abs: 2523 cells/uL (ref 850–3900)
MCH: 28 pg (ref 27.0–33.0)
MCHC: 33.1 g/dL (ref 32.0–36.0)
MCV: 84.7 fL (ref 80.0–100.0)
MPV: 9.7 fL (ref 7.5–12.5)
Monocytes Relative: 8.1 %
Neutro Abs: 2616 cells/uL (ref 1500–7800)
Neutrophils Relative %: 45.1 %
Platelets: 403 10*3/uL — ABNORMAL HIGH (ref 140–400)
RBC: 4.5 10*6/uL (ref 3.80–5.10)
RDW: 13.8 % (ref 11.0–15.0)
Total Lymphocyte: 43.5 %
WBC: 5.8 10*3/uL (ref 3.8–10.8)

## 2022-05-13 LAB — COMPREHENSIVE METABOLIC PANEL
AG Ratio: 1.3 (calc) (ref 1.0–2.5)
ALT: 15 U/L (ref 6–29)
AST: 14 U/L (ref 10–35)
Albumin: 3.6 g/dL (ref 3.6–5.1)
Alkaline phosphatase (APISO): 104 U/L (ref 37–153)
BUN: 12 mg/dL (ref 7–25)
CO2: 26 mmol/L (ref 20–32)
Calcium: 8.7 mg/dL (ref 8.6–10.4)
Chloride: 107 mmol/L (ref 98–110)
Creat: 0.78 mg/dL (ref 0.50–1.03)
Globulin: 2.8 g/dL (calc) (ref 1.9–3.7)
Glucose, Bld: 90 mg/dL (ref 65–99)
Potassium: 4.1 mmol/L (ref 3.5–5.3)
Sodium: 141 mmol/L (ref 135–146)
Total Bilirubin: 0.5 mg/dL (ref 0.2–1.2)
Total Protein: 6.4 g/dL (ref 6.1–8.1)

## 2022-05-13 LAB — TSH: TSH: 1.76 mIU/L (ref 0.40–4.50)

## 2022-05-16 ENCOUNTER — Other Ambulatory Visit: Payer: Self-pay | Admitting: Family Medicine

## 2022-05-16 DIAGNOSIS — J40 Bronchitis, not specified as acute or chronic: Secondary | ICD-10-CM

## 2022-06-22 ENCOUNTER — Other Ambulatory Visit: Payer: Self-pay | Admitting: Family Medicine

## 2022-06-22 ENCOUNTER — Encounter: Payer: Self-pay | Admitting: Family Medicine

## 2022-06-22 DIAGNOSIS — J40 Bronchitis, not specified as acute or chronic: Secondary | ICD-10-CM

## 2022-08-09 ENCOUNTER — Encounter: Payer: Self-pay | Admitting: Gastroenterology

## 2022-09-07 ENCOUNTER — Encounter: Payer: Self-pay | Admitting: Gastroenterology

## 2022-09-13 ENCOUNTER — Ambulatory Visit (AMBULATORY_SURGERY_CENTER): Payer: Self-pay

## 2022-09-13 VITALS — Ht 65.0 in | Wt 285.0 lb

## 2022-09-13 DIAGNOSIS — Z86010 Personal history of colonic polyps: Secondary | ICD-10-CM

## 2022-09-13 MED ORDER — PEG 3350-KCL-NA BICARB-NACL 420 G PO SOLR: 4000.0000 mL | Freq: Once | ORAL | 0 refills | Status: AC

## 2022-09-13 NOTE — Progress Notes (Signed)
Pre visit completed via phone call; Patient verified name, DOB, and address;  No egg or soy allergy known to patient;  No issues known to pt with past sedation with any surgeries or procedures; Patient denies ever being told they had issues or difficulty with intubation;  No FH of Malignant Hyperthermia; Pt is not on diet pills; Pt is not on home 02;  Pt is not on blood thinners;  Pt reports issues with constipation -takes IRON, patient reports she has been increasing water intake, and will continue to do so, RN also advised to increase activity, and fruits/veggies that are allowed;  No A fib or A flutter Have any cardiac testing pending--NO Pt instructed to use Singlecare.com or GoodRx for a price reduction on prep;   Insurance verified during PV appt=self pay  Previsit completed and red dot placed by patient's name on their procedure day (on provider's schedule).

## 2022-09-19 ENCOUNTER — Encounter: Payer: Self-pay | Admitting: Gastroenterology

## 2022-09-19 ENCOUNTER — Ambulatory Visit (AMBULATORY_SURGERY_CENTER): Payer: Self-pay | Admitting: Gastroenterology

## 2022-09-19 VITALS — BP 116/69 | HR 70 | Temp 96.9°F | Resp 18 | Ht 65.0 in | Wt 285.0 lb

## 2022-09-19 DIAGNOSIS — D123 Benign neoplasm of transverse colon: Secondary | ICD-10-CM

## 2022-09-19 DIAGNOSIS — Z86010 Personal history of colonic polyps: Secondary | ICD-10-CM

## 2022-09-19 DIAGNOSIS — Z09 Encounter for follow-up examination after completed treatment for conditions other than malignant neoplasm: Secondary | ICD-10-CM

## 2022-09-19 MED ORDER — SODIUM CHLORIDE 0.9 % IV SOLN
500.0000 mL | Freq: Once | INTRAVENOUS | Status: DC
Start: 2022-09-19 — End: 2022-09-19

## 2022-09-19 NOTE — Op Note (Signed)
Jenny Cohen Patient Name: Jenny Cohen Procedure Date: 09/19/2022 8:10 AM MRN: [id number] Endoscopist: Sherilyn Cooter L. 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 The patient's tolerance of                            previous anesthesia was also reviewed. The risks                            and benefits of the procedure and the sedation                            options and risks were discussed with the patient.                            All questions were answered, and informed consent                            was obtained. Prior Anticoagulants: The patient has                            taken no anticoagulant or antiplatelet agents. ASA  Grade Assessment: III - A patient with severe                            systemic disease. After reviewing the risks and                            benefits, the patient was deemed in satisfactory                            condition to undergo the procedure.                           After obtaining informed consent, the colonoscope                            was passed under direct vision. Throughout the                            procedure, the patient's  blood pressure, pulse, and                            oxygen saturations were monitored continuously. The                            Olympus CF-HQ190L ([id number]) Colonoscope was                            introduced through the anus and advanced to the the                            cecum, identified by appendiceal orifice and                            ileocecal valve. The colonoscopy was performed                            without difficulty. The patient tolerated the                            procedure well. The quality of the bowel                            preparation was good. The ileocecal valve,                            appendiceal orifice, and rectum were photographed.                            Patient had a 2-day prep with Suprep and MiraLAX Scope In: 8:13:12 AM Scope Out: 8:28:52 AM Scope Withdrawal Time: 0 hours 11 minutes 24 seconds  Total Procedure Duration: 0 hours 15 minutes 40 seconds  Findings:                 The perianal and digital rectal examinations were  normal.                           There was a small lipoma, in the ascending colon.                           A diminutive polyp was found in the transverse                            colon. The polyp was sessile. The polyp was removed                            with a cold snare. Resection and retrieval were                            complete.                           Diverticula were found in the left colon.                           The exam was otherwise without abnormality on                            direct and retroflexion views.                           Repeat examination of right colon under NBI                            performed. Complications:            No immediate complications. Estimated Blood Loss:     Estimated blood loss was minimal. Impression:               - Small lipoma in the ascending colon.                           - One diminutive polyp in the  transverse colon,                            removed with a cold snare. Resected and retrieved.                           - Diverticulosis in the left colon.                           - The examination was otherwise normal on direct                            and retroflexion views. Recommendation:           - Patient has a contact number available for                            emergencies. The signs and symptoms of potential  delayed complications were discussed with the                            patient. Return to normal activities tomorrow.                            Written discharge instructions were provided to the                            patient.                           - Resume previous diet.                           - Continue present medications.                           - Await pathology results.                           - Repeat colonoscopy in 5 years for surveillance. [name] L. Myrtie Neither, MD 09/19/2022 8:34:34 AM This report has been signed electronically.

## 2022-09-19 NOTE — Progress Notes (Signed)
Called to room to assist during endoscopic procedure.  Patient ID and intended procedure confirmed with present staff. Received instructions for my participation in the procedure from the performing physician.  

## 2022-09-19 NOTE — Patient Instructions (Signed)
Handouts provided about diverticulosis and polyps.  Resume previous diet.  Continue present medications.  Await pathology results.  Repeat colonoscopy in 5 years for surveillance.   YOU HAD AN ENDOSCOPIC PROCEDURE TODAY AT THE [facility]:   Refer to the procedure report that was given to you for any specific questions about what was found during the examination.  If the procedure report does not answer your questions, please call your gastroenterologist to clarify.  If you requested that your care partner not be given the details of your procedure findings, then the procedure report has been included in a sealed envelope for you to review at your convenience later.  YOU SHOULD EXPECT: Some feelings of bloating in the abdomen. Passage of more gas than usual.  Walking can help get rid of the air that was put into your GI tract during the procedure and reduce the bloating. If you had a lower endoscopy (such as a colonoscopy or flexible sigmoidoscopy) you may notice spotting of blood in your stool or on the toilet paper. If you underwent a bowel prep for your procedure, you may not have a normal bowel movement for a few days.  Please Note:  You might notice some irritation and congestion in your nose or some drainage.  This is from the oxygen used during your procedure.  There is no need for concern and it should clear up in a day or so.  SYMPTOMS TO REPORT IMMEDIATELY:  Following lower endoscopy (colonoscopy or flexible sigmoidoscopy):  Excessive amounts of blood in the stool  Significant tenderness or worsening of abdominal pains  Swelling of the abdomen that is new, acute  Fever of 100F or higher   For urgent or emergent issues, a gastroenterologist can be reached at any hour by calling [phone]. Do not use MyChart messaging for urgent concerns.    DIET:  We do recommend a small meal at first, but then you may proceed to your regular diet.  Drink plenty of fluids but you  should avoid alcoholic beverages for 24 hours.  ACTIVITY:  You should plan to take it easy for the rest of today and you should NOT DRIVE or use heavy machinery until tomorrow (because of the sedation medicines used during the test).    FOLLOW UP: Our staff will call the number listed on your records the next business day following your procedure.  We will call around 7:15- 8:00 am to check on you and address any questions or concerns that you may have regarding the information given to you following your procedure. If we do not reach you, we will leave a message.     If any biopsies were taken you will be contacted by phone or by letter within the next 1-3 weeks.  Please call us at [phone] if you have not heard about the biopsies in 3 weeks.    SIGNATURES/CONFIDENTIALITY: You and/or your care partner have signed paperwork which will be entered into your electronic medical record.  These signatures attest to the fact that that the information above on your After Visit Summary has been reviewed and is understood.  Full responsibility of the confidentiality of this discharge information lies with you and/or your care-partner.

## 2022-09-19 NOTE — Progress Notes (Signed)
History and Physical:  This patient presents for endoscopic testing for: Encounter Diagnosis  Name Primary?   Personal history of colonic polyps Yes    Last colonoscopy April 2021 - SSP and TA TVA in 2015 as well  Patient denies chronic abdominal pain, rectal bleeding, constipation or diarrhea.   Patient is otherwise without complaints or active issues today.   Past Medical History: Past Medical History:  Diagnosis Date   Anemia    on meds   Anxiety    on meds   Arthritis    LEFT SHOULDER   Chicken pox    DUB (dysfunctional uterine bleeding)    Fibroids    Heart murmur    as a child   Hyperlipidemia    black seed oil   Morbid obesity    BMI 47   Seasonal allergies      Past Surgical History: Past Surgical History:  Procedure Laterality Date   ABDOMINAL HYSTERECTOMY     CESAREAN SECTION     COLONOSCOPY  2021   HD-MAC-2 day miralax/suprep (good prep)-SSP x 1 TA x 1-3 yr recall   DILATION AND CURETTAGE OF UTERUS     ENDOMETRIAL ABLATION     LAPAROSCOPY  1990   with laparotomy   PARTIAL HYSTERECTOMY     POLYPECTOMY  2021   SSP x 1 TA  x 1   TONSILLECTOMY AND ADENOIDECTOMY     WISDOM TOOTH EXTRACTION      Allergies: Allergies  Allergen Reactions   Miconazole Nitrate Itching   Vagisil Itching    Outpatient Meds: Current Outpatient Medications  Medication Sig Dispense Refill   ASHWAGANDHA PO Take 1 capsule by mouth daily at 6 (six) AM. With sea moss     BLACK CURRANT SEED OIL PO Take 1 capsule by mouth daily at 6 (six) AM.     Ferrous Sulfate (IRON) 325 (65 Fe) MG TABS Take 1 tablet by mouth daily at 6 (six) AM.     ALPRAZolam (XANAX) 0.25 MG tablet TAKE 1 TABLET(0.25 MG) BY MOUTH THREE TIMES DAILY AS NEEDED FOR ANXIETY (Patient not taking: Reported on 09/13/2022) 30 tablet 1   neomycin-polymyxin b-dexamethasone (MAXITROL) 3.5-10000-0.1 SUSP Place 1 drop into the right eye 3 (three) times daily as needed. (Patient not taking: Reported on 09/13/2022)      Current Facility-Administered Medications  Medication Dose Route Frequency Provider Last Rate Last Admin   0.9 %  sodium chloride infusion  500 mL Intravenous Once Charlie Pitter III, MD          ___________________________________________________________________ Objective   Exam:  BP (!) 110/50   Pulse 72   Temp (!) 96.9 F (36.1 C)   Ht  (1.651 m)   Wt 285 lb (129.3 kg)   LMP 07/14/2013   SpO2 96%   BMI 47.43 kg/m   CV: regular , S1/S2 Resp: clear to auscultation bilaterally, normal RR and effort noted GI: soft, no tenderness, with active bowel sounds.   Assessment: Encounter Diagnosis  Name Primary?   Personal history of colonic polyps Yes     Plan: Colonoscopy  The benefits and risks of the planned procedure were described in detail with the patient or (when appropriate) their health care proxy.  Risks were outlined as including, but not limited to, bleeding, infection, perforation, adverse medication reaction leading to cardiac or pulmonary decompensation, pancreatitis (if ERCP).  The limitation of incomplete mucosal visualization was also discussed.  No guarantees or warranties were given.  The patient is appropriate for an endoscopic procedure in the ambulatory setting.   - Wilfrid Lund, MD

## 2022-09-19 NOTE — Progress Notes (Signed)
Report to PACU, RN, vss, BBS= Clear.  

## 2022-09-19 NOTE — Progress Notes (Signed)
Pt's states no medical or surgical changes since previsit or office visit. 

## 2022-09-20 ENCOUNTER — Telehealth: Payer: Self-pay

## 2022-09-20 NOTE — Telephone Encounter (Signed)
  Follow up Call-     09/19/2022    7:15 AM  Call back number  Post procedure Call Back phone  # [phone]  Permission to leave phone message Yes     Patient questions:  Do you have a fever, pain , or abdominal swelling? No. Pain Score  0 *  Have you tolerated food without any problems? Yes.    Have you been able to return to your normal activities? Yes.    Do you have any questions about your discharge instructions: Diet   No. Medications  No. Follow up visit  No.  Do you have questions or concerns about your Care? No.  Actions: * If pain score is 4 or above: No action needed, pain <4.

## 2022-09-22 ENCOUNTER — Emergency Department (HOSPITAL_COMMUNITY): Payer: Self-pay

## 2022-09-22 ENCOUNTER — Encounter: Payer: Self-pay | Admitting: Gastroenterology

## 2022-09-22 ENCOUNTER — Encounter (HOSPITAL_COMMUNITY): Payer: Self-pay

## 2022-09-22 ENCOUNTER — Emergency Department (HOSPITAL_COMMUNITY)
Admission: EM | Admit: 2022-09-22 | Discharge: 2022-09-22 | Disposition: A | Discharge status: Home / Self Care | Payer: Self-pay | Attending: Emergency Medicine | Admitting: Emergency Medicine

## 2022-09-22 ENCOUNTER — Other Ambulatory Visit: Payer: Self-pay

## 2022-09-22 DIAGNOSIS — N200 Calculus of kidney: Secondary | ICD-10-CM | POA: Insufficient documentation

## 2022-09-22 DIAGNOSIS — N83201 Unspecified ovarian cyst, right side: Secondary | ICD-10-CM

## 2022-09-22 LAB — URINALYSIS, ROUTINE W REFLEX MICROSCOPIC
Bilirubin Urine: NEGATIVE
Glucose, UA: NEGATIVE mg/dL
Ketones, ur: NEGATIVE mg/dL
Leukocytes,Ua: NEGATIVE
Nitrite: NEGATIVE
Protein, ur: 30 mg/dL — AB
RBC / HPF: >50 RBC/hpf (ref 0–5)
Specific Gravity, Urine: 1.02 (ref 1.005–1.030)
pH: 6 (ref 5.0–8.0)

## 2022-09-22 LAB — COMPREHENSIVE METABOLIC PANEL
ALT: 14 U/L (ref 0–44)
AST: 13 U/L — ABNORMAL LOW (ref 15–41)
Albumin: 3.7 g/dL (ref 3.5–5.0)
Alkaline Phosphatase: 99 U/L (ref 38–126)
Anion gap: 9 (ref 5–15)
BUN: 12 mg/dL (ref 6–20)
CO2: 23 mmol/L (ref 22–32)
Calcium: 8.7 mg/dL — ABNORMAL LOW (ref 8.9–10.3)
Chloride: 105 mmol/L (ref 98–111)
Creatinine, Ser: 0.72 mg/dL (ref 0.44–1.00)
GFR, Estimated: >60 mL/min (ref >60)
Glucose, Bld: 103 mg/dL — ABNORMAL HIGH (ref 70–99)
Potassium: 3.8 mmol/L (ref 3.5–5.1)
Sodium: 137 mmol/L (ref 135–145)
Total Bilirubin: 0.7 mg/dL (ref 0.3–1.2)
Total Protein: 7.6 g/dL (ref 6.5–8.1)

## 2022-09-22 LAB — CBC WITH DIFFERENTIAL/PLATELET
Abs Immature Granulocytes: 0.01 10*3/uL (ref 0.00–0.07)
Basophils Absolute: 0.1 10*3/uL (ref 0.0–0.1)
Basophils Relative: 1 %
Eosinophils Absolute: 0.1 10*3/uL (ref 0.0–0.5)
Eosinophils Relative: 1 %
HCT: 44.8 % (ref 36.0–46.0)
Hemoglobin: 14.2 g/dL (ref 12.0–15.0)
Immature Granulocytes: 0 %
Lymphocytes Relative: 30 %
Lymphs Abs: 1.7 10*3/uL (ref 0.7–4.0)
MCH: 27.8 pg (ref 26.0–34.0)
MCHC: 31.7 g/dL (ref 30.0–36.0)
MCV: 87.8 fL (ref 80.0–100.0)
Monocytes Absolute: 0.3 10*3/uL (ref 0.1–1.0)
Monocytes Relative: 6 %
Neutro Abs: 3.5 10*3/uL (ref 1.7–7.7)
Neutrophils Relative %: 62 %
Platelets: 462 10*3/uL — ABNORMAL HIGH (ref 150–400)
RBC: 5.1 MIL/uL (ref 3.87–5.11)
RDW: 13.9 % (ref 11.5–15.5)
WBC: 5.6 10*3/uL (ref 4.0–10.5)
nRBC: 0 % (ref 0.0–0.2)

## 2022-09-22 LAB — LIPASE, BLOOD: Lipase: 27 U/L (ref 11–51)

## 2022-09-22 MED ORDER — TAMSULOSIN HCL 0.4 MG PO CAPS: 0.4000 mg | ORAL_CAPSULE | Freq: Every day | ORAL | 0 refills | Status: DC

## 2022-09-22 MED ORDER — HYDROCODONE-ACETAMINOPHEN 5-325 MG PO TABS: 1.0000 | ORAL_TABLET | ORAL | 0 refills | Status: DC | PRN

## 2022-09-22 MED ORDER — ONDANSETRON 4 MG PO TBDP
4.0000 mg | ORAL_TABLET | Freq: Once | ORAL | Status: AC
  Administered 2022-09-22: 4 mg via ORAL
  Filled 2022-09-22: qty 1

## 2022-09-22 MED ORDER — ONDANSETRON 4 MG PO TBDP: ORAL_TABLET | ORAL | 0 refills | Status: DC

## 2022-09-22 NOTE — ED Provider Notes (Signed)
[place] EMERGENCY DEPARTMENT AT [facility] Provider Note   CSN: [id number] Arrival date & time: 09/22/22  1531     History  Chief Complaint  Patient presents with   Flank Pain    Jenny Cohen is a 57 y.o. female.  Patient is a 57 year old female who presents with right flank pain.  It started earlier this morning.  She says that it has been gradually worsening through the day.  She has had some nausea but no vomiting.  No change in her stools other than she had a recent colonoscopy and is still working on getting a regular pattern back to her stools.  No urinary symptoms.  No known fevers.  No prior abdominal surgeries.       Home Medications Prior to Admission medications   Medication Sig Start Date End Date Taking? Authorizing Provider  HYDROcodone-acetaminophen (NORCO/VICODIN) 5-325 MG tablet Take 1-2 tablets by mouth every 4 (four) hours as needed. 09/22/22  Yes Rolan Bucco, MD  ondansetron (ZOFRAN-ODT) 4 MG disintegrating tablet 4mg  ODT q4 hours prn nausea/vomit 09/22/22  Yes Rolan Bucco, MD  tamsulosin (FLOMAX) 0.4 MG CAPS capsule Take 1 capsule (0.4 mg total) by mouth daily. 09/22/22  Yes Rolan Bucco, MD  ALPRAZolam (XANAX) 0.25 MG tablet TAKE 1 TABLET(0.25 MG) BY MOUTH THREE TIMES DAILY AS NEEDED FOR ANXIETY Patient not taking: Reported on 09/13/2022 05/12/22   Seabron Spates R, DO  ASHWAGANDHA PO Take 1 capsule by mouth daily at 6 (six) AM. With sea moss    [provider]  BLACK CURRANT SEED OIL PO Take 1 capsule by mouth daily at 6 (six) AM.    [provider]  Ferrous Sulfate (IRON) 325 (65 Fe) MG TABS Take 1 tablet by mouth daily at 6 (six) AM.    [provider]  neomycin-polymyxin b-dexamethasone (MAXITROL) 3.5-10000-0.1 SUSP Place 1 drop into the right eye 3 (three) times daily as needed. Patient not taking: Reported on 09/13/2022 04/09/22   [provider]      Allergies    Miconazole nitrate and  Vagisil    Review of Systems   Review of Systems  Constitutional:  Negative for chills, diaphoresis, fatigue and fever.  HENT:  Negative for congestion, rhinorrhea and sneezing.   Eyes: Negative.   Respiratory:  Negative for cough, chest tightness and shortness of breath.   Cardiovascular:  Negative for chest pain and leg swelling.  Gastrointestinal:  Positive for abdominal pain and nausea. Negative for blood in stool, diarrhea and vomiting.  Genitourinary:  Negative for difficulty urinating, flank pain, frequency and hematuria.  Musculoskeletal:  Negative for arthralgias and back pain.  Skin:  Negative for rash.  Neurological:  Negative for dizziness, speech difficulty, weakness, numbness and headaches.    Physical Exam Updated Vital Signs BP (!) 140/59   Pulse 66   Temp 98 F (36.7 C) (Oral)   Resp (!) 24   LMP 07/14/2013   SpO2 97%  Physical Exam Constitutional:      Appearance: She is well-developed.  HENT:     Head: Normocephalic and atraumatic.  Eyes:     Pupils: Pupils are equal, round, and reactive to light.  Cardiovascular:     Rate and Rhythm: Normal rate and regular rhythm.     Heart sounds: Normal heart sounds.  Pulmonary:     Effort: Pulmonary effort is normal. No respiratory distress.     Breath sounds: Normal breath sounds. No wheezing or rales.  Chest:  Chest wall: No tenderness.  Abdominal:     General: Bowel sounds are normal.     Palpations: Abdomen is soft.     Tenderness: There is abdominal tenderness (Right mid abdomen). There is no guarding or rebound.  Musculoskeletal:        General: Normal range of motion.     Cervical back: Normal range of motion and neck supple.  Lymphadenopathy:     Cervical: No cervical adenopathy.  Skin:    General: Skin is warm and dry.     Findings: No rash.  Neurological:     Mental Status: She is alert and oriented to person, place, and time.     ED Results / Procedures / Treatments   Labs (all labs  ordered are listed, but only abnormal results are displayed) Labs Reviewed  CBC WITH DIFFERENTIAL/PLATELET - Abnormal; Notable for the following components:      Result Value   Platelets 462 (*)    All other components within normal limits  COMPREHENSIVE METABOLIC PANEL - Abnormal; Notable for the following components:   Glucose, Bld 103 (*)    Calcium 8.7 (*)    AST 13 (*)    All other components within normal limits  URINALYSIS, ROUTINE W REFLEX MICROSCOPIC - Abnormal; Notable for the following components:   APPearance HAZY (*)    Hgb urine dipstick LARGE (*)    Protein, ur 30 (*)    Bacteria, UA RARE (*)    All other components within normal limits  LIPASE, BLOOD    EKG EKG Interpretation  Date/Time:  Friday September 22 2022 16:12:37 EDT Ventricular Rate:  64 PR Interval:  170 QRS Duration: 89 QT Interval:  422 QTC Calculation: 436 R Axis:   4 Text Interpretation: Sinus rhythm Low voltage, precordial leads since last tracing no significant change Confirmed by Rolan Bucco [phone]) on 09/22/2022 5:11:16 PM  Radiology CT RENAL STONE STUDY  Result Date: 09/22/2022 CLINICAL DATA:  Right-sided flank pain EXAM: CT ABDOMEN AND PELVIS WITHOUT CONTRAST TECHNIQUE: Multidetector CT imaging of the abdomen and pelvis was performed following the standard protocol without IV contrast. RADIATION DOSE REDUCTION: This exam was performed according to the departmental dose-optimization program which includes automated exposure control, adjustment of the mA and/or kV according to patient size and/or use of iterative reconstruction technique. COMPARISON:  03/19/2020, ultrasound from 04/16/2020 FINDINGS: Lower chest: Lung bases are free of acute infiltrate or sizable effusion. Hepatobiliary: Gallbladder is within normal limits. Liver is well visualized. There is a cyst identified within the caudate lobe measuring up to 3.2 cm. This is stable in appearance from the prior exam. A smaller cyst is noted  adjacent to the IVC near the dome of the liver also stable from the prior exam. Pancreas: Unremarkable. No pancreatic ductal dilatation or surrounding inflammatory changes. Spleen: Normal in size without focal abnormality. Adrenals/Urinary Tract: Right adrenal gland is within normal limits. Left adrenal gland demonstrates a 15 mm fatty lesion consistent with small adenoma. This is stable from the prior exam. Left kidney demonstrates some nonobstructing renal calculi. Right kidney demonstrates mild hydronephrosis and evidence of a right UPJ stone measuring up to 5 mm causing the obstructive change. The more distal right ureter is within normal limits. The bladder is partially distended. Stomach/Bowel: The appendix is within normal limits. No obstructive or inflammatory changes of colon are seen. Stomach and small bowel are within normal limits. Vascular/Lymphatic: Aortic atherosclerosis. No enlarged abdominal or pelvic lymph nodes. Reproductive: Uterus has been surgically  removed. No left adnexal mass is seen. In the right adnexa there is a mildly complex cystic lesion identified which measures 6.4 x 3.8 cm. A thin septation is noted within similar to that seen on prior examinations. The lesion measures fluid attenuation consistent with minimally complicated cyst. Other: No abdominal wall hernia or abnormality. No abdominopelvic ascites. Musculoskeletal: No acute or significant osseous findings. IMPRESSION: 5 mm UPJ stone on the right causing mild hydronephrosis. Nonobstructing left renal stone. 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 Right adnexal cyst with thin septation internally. The overall lesion has grown, now measuring 6.4 cm versus 4.9 cm on the prior exam. Fluid attenuation is noted within. Gynecological consultation is recommended with follow-up as clinically indicated. Electronically Signed   By: Alcide Clever M.D.   On: 09/22/2022 19:13    Procedures Procedures    Medications Ordered  in ED Medications  ondansetron (ZOFRAN-ODT) disintegrating tablet 4 mg (4 mg Oral Given 09/22/22 1558)    ED Course/ Medical Decision Making/ A&P                             Medical Decision Making Risk Prescription drug management.   Patient is a 57 year old who presents with pain in her right flank area.  She has had some associated nausea.  Her urinalysis has some hematuria.  No suggestions of infection.  Other labs are nonconcerning.  CT scan shows evidence of a right proximal ureteral stone.  There is also a right adnexal cyst that has enlarged since her last imaging.  She denies need for any pain medication.  She is afebrile.  She is otherwise well-appearing.  She is appropriate for outpatient treatment.  She was discharged home in good condition.  She was given a prescription for Vicodin, Zofran and Flomax.  I discussed the adnexal cyst and the fact that we will need follow-up with her gynecologist.  She was given a referral to follow-up with urology.  Return precautions were given.  Final Clinical Impression(s) / ED Diagnoses Final diagnoses:  Kidney stone    Rx / DC Orders ED Discharge Orders          Ordered    HYDROcodone-acetaminophen (NORCO/VICODIN) 5-325 MG tablet  Every 4 hours PRN        09/22/22 1934    ondansetron (ZOFRAN-ODT) 4 MG disintegrating tablet        09/22/22 1934    tamsulosin (FLOMAX) 0.4 MG CAPS capsule  Daily        09/22/22 1934              Rolan Bucco, MD 09/22/22 1938

## 2022-09-22 NOTE — Discharge Instructions (Addendum)
Make an appointment to follow-up with the urologist as discussed.  You also need to follow-up with your gynecologist about the enlarging cyst on your right ovary.  Return to the emergency room if you have any worsening pain, vomiting, fevers, difficulty urinating or other worsening symptoms.

## 2022-09-22 NOTE — ED Provider Triage Note (Signed)
Emergency Medicine Provider Triage Evaluation Note  Jenny Cohen , a 57 y.o. female  was evaluated in triage.  Pt complains of flank pain that began this morning.  States right flank pain radiates to her back from her front.  Patient denies history of stones and hematuria.  Patient still has her gallbladder and appendix.  Patient endorses nausea without emesis.  Patient denies chest pain, shortness of breath, change in sensation/motor skills  Review of Systems  Positive: See HPI Negative: See HPI  Physical Exam  LMP 07/14/2013  Gen:   Awake, no distress   Resp:  Normal effort  MSK:   Moves extremities without difficulty  Other:  No CVA tenderness or abd tenderness  Medical Decision Making  Medically screening exam initiated at 3:38 PM.  Appropriate orders placed.  Jenny Cohen was informed that the remainder of the evaluation will be completed by another provider, this initial triage assessment does not replace that evaluation, and the importance of remaining in the ED until their evaluation is complete.  Workup initiated, patient given Zofran for nausea stable at this time   Remi Deter 09/22/22 1541

## 2022-09-22 NOTE — ED Triage Notes (Signed)
C/o right flank pain that started this am that radiated to her abd with nausea. Denies hematuria.  Recent endoscopy on 09/19/22

## 2022-11-21 ENCOUNTER — Emergency Department (HOSPITAL_COMMUNITY): Payer: Self-pay

## 2022-11-21 ENCOUNTER — Encounter (HOSPITAL_COMMUNITY): Payer: Self-pay

## 2022-11-21 ENCOUNTER — Emergency Department (HOSPITAL_COMMUNITY)
Admission: EM | Admit: 2022-11-21 | Discharge: 2022-11-22 | Disposition: A | Discharge status: Home / Self Care | Payer: Self-pay | Attending: Emergency Medicine | Admitting: Emergency Medicine

## 2022-11-21 ENCOUNTER — Other Ambulatory Visit: Payer: Self-pay

## 2022-11-21 DIAGNOSIS — N83201 Unspecified ovarian cyst, right side: Secondary | ICD-10-CM | POA: Insufficient documentation

## 2022-11-21 DIAGNOSIS — N201 Calculus of ureter: Secondary | ICD-10-CM | POA: Insufficient documentation

## 2022-11-21 LAB — CBC
HCT: 43.1 % (ref 36.0–46.0)
Hemoglobin: 13.7 g/dL (ref 12.0–15.0)
MCH: 28.3 pg (ref 26.0–34.0)
MCHC: 31.8 g/dL (ref 30.0–36.0)
MCV: 89 fL (ref 80.0–100.0)
Platelets: 428 10*3/uL — ABNORMAL HIGH (ref 150–400)
RBC: 4.84 MIL/uL (ref 3.87–5.11)
RDW: 13.8 % (ref 11.5–15.5)
WBC: 7.1 10*3/uL (ref 4.0–10.5)
nRBC: 0 % (ref 0.0–0.2)

## 2022-11-21 LAB — URINALYSIS, ROUTINE W REFLEX MICROSCOPIC
Bilirubin Urine: NEGATIVE
Glucose, UA: NEGATIVE mg/dL
Hgb urine dipstick: NEGATIVE
Ketones, ur: 5 mg/dL — AB
Leukocytes,Ua: NEGATIVE
Nitrite: NEGATIVE
Protein, ur: NEGATIVE mg/dL
Specific Gravity, Urine: 1.023 (ref 1.005–1.030)
pH: 6 (ref 5.0–8.0)

## 2022-11-21 LAB — BASIC METABOLIC PANEL
Anion gap: 9 (ref 5–15)
BUN: 13 mg/dL (ref 6–20)
CO2: 25 mmol/L (ref 22–32)
Calcium: 8.8 mg/dL — ABNORMAL LOW (ref 8.9–10.3)
Chloride: 102 mmol/L (ref 98–111)
Creatinine, Ser: 1.07 mg/dL — ABNORMAL HIGH (ref 0.44–1.00)
GFR, Estimated: >60 mL/min (ref >60)
Glucose, Bld: 113 mg/dL — ABNORMAL HIGH (ref 70–99)
Potassium: 3.8 mmol/L (ref 3.5–5.1)
Sodium: 136 mmol/L (ref 135–145)

## 2022-11-21 MED ORDER — HYDROMORPHONE HCL 1 MG/ML IJ SOLN
1.0000 mg | Freq: Once | INTRAMUSCULAR | Status: AC
  Administered 2022-11-21: 1 mg via INTRAVENOUS
  Filled 2022-11-21: qty 1

## 2022-11-21 MED ORDER — ONDANSETRON HCL 4 MG/2ML IJ SOLN
4.0000 mg | Freq: Once | INTRAMUSCULAR | Status: AC
  Administered 2022-11-21: 4 mg via INTRAVENOUS
  Filled 2022-11-21: qty 2

## 2022-11-21 MED ORDER — IOHEXOL 300 MG/ML  SOLN
100.0000 mL | Freq: Once | INTRAMUSCULAR | Status: AC | PRN
  Administered 2022-11-21: 100 mL via INTRAVENOUS

## 2022-11-21 MED ORDER — SODIUM CHLORIDE 0.9 % IV BOLUS
1000.0000 mL | Freq: Once | INTRAVENOUS | Status: AC
  Administered 2022-11-21: 1000 mL via INTRAVENOUS

## 2022-11-21 NOTE — ED Triage Notes (Signed)
Patient reports right flank pain and nausea starting this morning. Has known kidney stone on right and left side, diagnosed 3-4 weeks with this. Saw urologist and has follow up this week to make decision on procedure or not. States she has taken hydrocodone and zofran today with no relief.

## 2022-11-22 ENCOUNTER — Emergency Department (HOSPITAL_COMMUNITY): Payer: Self-pay

## 2022-11-22 MED ORDER — KETOROLAC TROMETHAMINE 30 MG/ML IJ SOLN
30.0000 mg | Freq: Once | INTRAMUSCULAR | Status: AC
  Administered 2022-11-22: 30 mg via INTRAVENOUS
  Filled 2022-11-22: qty 1

## 2022-11-22 MED ORDER — ONDANSETRON HCL 4 MG PO TABS: 4.0000 mg | ORAL_TABLET | Freq: Four times a day (QID) | ORAL | 0 refills | Status: DC

## 2022-11-22 MED ORDER — TAMSULOSIN HCL 0.4 MG PO CAPS: 0.4000 mg | ORAL_CAPSULE | Freq: Every day | ORAL | 0 refills | Status: AC

## 2022-11-22 MED ORDER — OXYCODONE-ACETAMINOPHEN 5-325 MG PO TABS: 1.0000 | ORAL_TABLET | Freq: Three times a day (TID) | ORAL | 0 refills | Status: AC | PRN

## 2022-11-22 MED ORDER — HYDROMORPHONE HCL 1 MG/ML IJ SOLN
1.0000 mg | Freq: Once | INTRAMUSCULAR | Status: AC
  Administered 2022-11-22: 1 mg via INTRAVENOUS
  Filled 2022-11-22: qty 1

## 2022-11-22 NOTE — ED Provider Notes (Signed)
[place] EMERGENCY DEPARTMENT AT [facility] Provider Note   CSN: [id number] Arrival date & time: 11/21/22  2001     History  Chief Complaint  Patient presents with   Flank Pain    Jenny Cohen is a 57 y.o. female.  HPI   Patient with medical history including anxiety, hyperlipidemia, fibroids, with complaints of right-sided flank pain, states she  having flank pain since diagnosed with a kidney stone, pain was initially intermittent, but starting today, pain has remained constant  in her right flank, will radiate into her right lower abdomen, associated hematuria with urinary frequency, denies dysuria, no vaginal discharge or vaginal bleeding, no  pelvic pain, states that she has associated nausea and vomiting denies any bloody emesis or coffee-ground emesis still passing gas having bowel movements.  Patient states that this pain feels like when she first was diagnosed with her kidney stone,  denies excessive alcohol use or NSAID use, abdominal history includes partial hysterectomy, C-section, fibroid removal.  She states that she was taking Vicodin and Zofran without much relief.  Reviewed patient's chart patient was seen on April 26, was diagnosed with 5 mm UPJ stone on the right causing mild pyelonephritis, he also has a noted right adnexal cyst which is enlarged from 4.9-6.4.  Discharged home with pain medications antiemetics Flomax and told to follow-up with urology.    Home Medications Prior to Admission medications   Medication Sig Start Date End Date Taking? Authorizing Provider  ALPRAZolam (XANAX) 0.25 MG tablet TAKE 1 TABLET(0.25 MG) BY MOUTH THREE TIMES DAILY AS NEEDED FOR ANXIETY 05/12/22  Yes Seabron Spates R, DO  cholecalciferol (VITAMIN D3) 25 MCG (1000 UNIT) tablet Take 1,000 Units by mouth daily.   Yes [provider]  Ferrous Sulfate (IRON PO) Take 1 tablet by mouth daily.   Yes [provider]  ondansetron (ZOFRAN) 4 MG tablet  Take 1 tablet (4 mg total) by mouth every 6 (six) hours. 11/22/22  Yes Carroll Sage, PA-C  oxyCODONE-acetaminophen (PERCOCET/ROXICET) 5-325 MG tablet Take 1 tablet by mouth every 8 (eight) hours as needed for up to 3 days for severe pain. 11/22/22 11/25/22 Yes Carroll Sage, PA-C  tamsulosin (FLOMAX) 0.4 MG CAPS capsule Take 1 capsule (0.4 mg total) by mouth daily for 14 days. 11/22/22 12/06/22 Yes Carroll Sage, PA-C  vitamin B-12 (CYANOCOBALAMIN) 100 MCG tablet Take 100 mcg by mouth daily.   Yes [provider]  HYDROcodone-acetaminophen (NORCO/VICODIN) 5-325 MG tablet Take 1-2 tablets by mouth every 4 (four) hours as needed. Patient not taking: Reported on 11/22/2022 09/22/22   Rolan Bucco, MD  ondansetron (ZOFRAN-ODT) 4 MG disintegrating tablet 4mg  ODT q4 hours prn nausea/vomit Patient not taking: Reported on 11/22/2022 09/22/22   Rolan Bucco, MD  tamsulosin (FLOMAX) 0.4 MG CAPS capsule Take 1 capsule (0.4 mg total) by mouth daily. Patient not taking: Reported on 11/22/2022 09/22/22   Rolan Bucco, MD      Allergies    Miconazole nitrate and Vagisil    Review of Systems   Review of Systems  Constitutional:  Negative for chills and fever.  Respiratory:  Negative for shortness of breath.   Cardiovascular:  Negative for chest pain.  Gastrointestinal:  Negative for abdominal pain.  Genitourinary:  Positive for difficulty urinating and flank pain.  Neurological:  Negative for headaches.    Physical Exam Updated Vital Signs BP 116/75   Pulse 69   Temp 98.1 F (36.7 C) (Oral)  Resp 18   Ht 5\' 5"  (1.651 m)   Wt 133.8 kg   LMP 07/14/2013   SpO2 96%   BMI 49.09 kg/m  Physical Exam Vitals and nursing note reviewed.  Constitutional:      General: She is not in acute distress.    Appearance: She is not ill-appearing.  HENT:     Head: Normocephalic and atraumatic.     Nose: No congestion.  Eyes:     Conjunctiva/sclera: Conjunctivae normal.   Cardiovascular:     Rate and Rhythm: Normal rate and regular rhythm.     Pulses: Normal pulses.     Heart sounds: No murmur heard.    No friction rub. No gallop.  Pulmonary:     Effort: No respiratory distress.     Breath sounds: No wheezing, rhonchi or rales.  Abdominal:     Palpations: Abdomen is soft.     Tenderness: There is abdominal tenderness. There is right CVA tenderness. There is no left CVA tenderness.     Comments: Abdomen nondistended, soft, noted right upper quadrant tenderness and right lower quadrant tenderness there is minimal right flank tenderness, no guarding rebound is or peritoneal sign negative Murphy sign or McBurney point.  Skin:    General: Skin is warm and dry.  Neurological:     Mental Status: She is alert.  Psychiatric:        Mood and Affect: Mood normal.     ED Results / Procedures / Treatments   Labs (all labs ordered are listed, but only abnormal results are displayed) Labs Reviewed  URINALYSIS, ROUTINE W REFLEX MICROSCOPIC - Abnormal; Notable for the following components:      Result Value   Ketones, ur 5 (*)    All other components within normal limits  BASIC METABOLIC PANEL - Abnormal; Notable for the following components:   Glucose, Bld 113 (*)    Creatinine, Ser 1.07 (*)    Calcium 8.8 (*)    All other components within normal limits  CBC - Abnormal; Notable for the following components:   Platelets 428 (*)    All other components within normal limits    EKG None  Radiology US Pelvis Complete  Result Date: 11/22/2022 CLINICAL DATA:  History of abdominal hysterectomy and left oophorectomy, presenting with adnexal pain. EXAM: TRANSABDOMINAL AND TRANSVAGINAL ULTRASOUND OF PELVIS DOPPLER ULTRASOUND OF OVARIES TECHNIQUE: Both transabdominal and transvaginal ultrasound examinations of the pelvis were performed. Transabdominal technique was performed for global imaging of the pelvis including uterus, ovaries, adnexal regions, and pelvic  cul-de-sac. It was necessary to proceed with endovaginal exam following the transabdominal exam to visualize the bilateral ovaries and bilateral adnexa. Color and duplex Doppler ultrasound was utilized to evaluate blood flow to the ovaries. COMPARISON:  April 16, 2020 FINDINGS: Uterus The uterus is surgically absent. Endometrium Thickness: N/A. Right ovary Measurements: 6.8 cm x 5.0 cm x 4.2 cm = volume: 74.9 mL. A 5.5 cm x 5.4 cm x 3.6 cm complex right ovarian cyst is seen. This is seen on the prior exam (measured 4.9 cm x 2.3 cm x 2.2 cm on the prior study). Left ovary The left ovary is surgically absent. Pulsed Doppler evaluation of the RIGHT ovary demonstrates normal low-resistance arterial and venous waveforms. Other findings No abnormal free fluid. IMPRESSION: 1. Findings consistent with history of prior hysterectomy and left oophorectomy. 2. Large complex right ovarian cyst, increased in size when compared to the prior study. Evaluation with nonemergent MRI is recommended to  further exclude the presence of an underlying neoplastic process. Electronically Signed   By: Aram Candela M.D.   On: 11/22/2022 02:38   US Transvaginal Non-OB  Result Date: 11/22/2022 CLINICAL DATA:  History of abdominal hysterectomy and left oophorectomy, presenting with adnexal pain. EXAM: TRANSABDOMINAL AND TRANSVAGINAL ULTRASOUND OF PELVIS DOPPLER ULTRASOUND OF OVARIES TECHNIQUE: Both transabdominal and transvaginal ultrasound examinations of the pelvis were performed. Transabdominal technique was performed for global imaging of the pelvis including uterus, ovaries, adnexal regions, and pelvic cul-de-sac. It was necessary to proceed with endovaginal exam following the transabdominal exam to visualize the bilateral ovaries and bilateral adnexa. Color and duplex Doppler ultrasound was utilized to evaluate blood flow to the ovaries. COMPARISON:  April 16, 2020 FINDINGS: Uterus The uterus is surgically absent. Endometrium  Thickness: N/A. Right ovary Measurements: 6.8 cm x 5.0 cm x 4.2 cm = volume: 74.9 mL. A 5.5 cm x 5.4 cm x 3.6 cm complex right ovarian cyst is seen. This is seen on the prior exam (measured 4.9 cm x 2.3 cm x 2.2 cm on the prior study). Left ovary The left ovary is surgically absent. Pulsed Doppler evaluation of the RIGHT ovary demonstrates normal low-resistance arterial and venous waveforms. Other findings No abnormal free fluid. IMPRESSION: 1. Findings consistent with history of prior hysterectomy and left oophorectomy. 2. Large complex right ovarian cyst, increased in size when compared to the prior study. Evaluation with nonemergent MRI is recommended to further exclude the presence of an underlying neoplastic process. Electronically Signed   By: Aram Candela M.D.   On: 11/22/2022 02:38   [place] Art/Ven Flow Abd Pelv Doppler  Result Date: 11/22/2022 CLINICAL DATA:  History of abdominal hysterectomy and left oophorectomy, presenting with adnexal pain. EXAM: TRANSABDOMINAL AND TRANSVAGINAL ULTRASOUND OF PELVIS DOPPLER ULTRASOUND OF OVARIES TECHNIQUE: Both transabdominal and transvaginal ultrasound examinations of the pelvis were performed. Transabdominal technique was performed for global imaging of the pelvis including uterus, ovaries, adnexal regions, and pelvic cul-de-sac. It was necessary to proceed with endovaginal exam following the transabdominal exam to visualize the bilateral ovaries and bilateral adnexa. Color and duplex Doppler ultrasound was utilized to evaluate blood flow to the ovaries. COMPARISON:  April 16, 2020 FINDINGS: Uterus The uterus is surgically absent. Endometrium Thickness: N/A. Right ovary Measurements: 6.8 cm x 5.0 cm x 4.2 cm = volume: 74.9 mL. A 5.5 cm x 5.4 cm x 3.6 cm complex right ovarian cyst is seen. This is seen on the prior exam (measured 4.9 cm x 2.3 cm x 2.2 cm on the prior study). Left ovary The left ovary is surgically absent. Pulsed Doppler evaluation of the RIGHT  ovary demonstrates normal low-resistance arterial and venous waveforms. Other findings No abnormal free fluid. IMPRESSION: 1. Findings consistent with history of prior hysterectomy and left oophorectomy. 2. Large complex right ovarian cyst, increased in size when compared to the prior study. Evaluation with nonemergent MRI is recommended to further exclude the presence of an underlying neoplastic process. Electronically Signed   By: Aram Candela M.D.   On: 11/22/2022 02:38   CT ABDOMEN PELVIS W CONTRAST  Result Date: 11/22/2022 CLINICAL DATA:  Right flank pain. EXAM: CT ABDOMEN AND PELVIS WITH CONTRAST TECHNIQUE: Multidetector CT imaging of the abdomen and pelvis was performed using the standard protocol following bolus administration of intravenous contrast. RADIATION DOSE REDUCTION: This exam was performed according to the departmental dose-optimization program which includes automated exposure control, adjustment of the mA and/or kV according to patient size and/or use of  iterative reconstruction technique. CONTRAST:  OMNIPAQUE IOHEXOL 300 MG/ML  SOLN COMPARISON:  September 22, 2022 FINDINGS: Lower chest: No acute abnormality. Hepatobiliary: Stable hepatic cysts are noted. No gallstones, gallbladder wall thickening, or biliary dilatation. Pancreas: Unremarkable. No pancreatic ductal dilatation or surrounding inflammatory changes. Spleen: Normal in size without focal abnormality. Adrenals/Urinary Tract: The right adrenal gland is unremarkable. A stable 13 mm diameter left adrenal adenoma is noted. Kidneys are normal in size, without focal lesions. A 2 mm nonobstructing renal calculus is seen within the left kidney. A 4 mm obstructing renal calculus is seen at the right UVJ, with moderate severity right-sided hydronephrosis and hydroureter. Delayed renal cortical enhancement is seen on the right. Bladder is unremarkable. Stomach/Bowel: Stomach is within normal limits. Appendix appears normal. No evidence  of bowel wall thickening, distention, or inflammatory changes. Vascular/Lymphatic: No significant vascular findings are present. No enlarged abdominal or pelvic lymph nodes. Reproductive: Status post hysterectomy. A stable 7.2 cm x 4.5 cm septated cyst is seen within the anteromedial aspect of the right adnexa. The left adnexa is unremarkable. Other: There is a small, fat containing umbilical hernia. No abdominopelvic ascites. Musculoskeletal: No acute or significant osseous findings. IMPRESSION: 1. 4 mm obstructing renal calculus at the right UVJ. 2. 2 mm nonobstructing left renal calculus. 3. Stable hepatic cysts. 4. Stable left adrenal adenoma. No follow-up imaging is recommended. This recommendation follows ACR consensus guidelines: Management of Incidental Adrenal Masses: A White Paper of the ACR Incidental Findings Committee. J Am Coll Radiol 2017;14:1038-1044. 5. Stable, septated right adnexal cyst, as described above. Gynecological consultation is recommended. Electronically Signed   By: Aram Candela M.D.   On: 11/22/2022 00:18    Procedures Procedures    Medications Ordered in ED Medications  ketorolac (TORADOL) 30 MG/ML injection 30 mg (has no administration in time range)  sodium chloride 0.9 % bolus 1,000 mL (0 mLs Intravenous Stopped 11/22/22 0137)  HYDROmorphone (DILAUDID) injection 1 mg (1 mg Intravenous Given 11/21/22 2347)  ondansetron (ZOFRAN) injection 4 mg (4 mg Intravenous Given 11/21/22 2346)  iohexol (OMNIPAQUE) 300 MG/ML solution 100 mL (100 mLs Intravenous Contrast Given 11/21/22 2353)  HYDROmorphone (DILAUDID) injection 1 mg (1 mg Intravenous Given 11/22/22 0141)    ED Course/ Medical Decision Making/ A&P                             Medical Decision Making Amount and/or Complexity of Data Reviewed Labs: ordered. Radiology: ordered.  Risk Prescription drug management.   This patient presents to the ED for concern of flank pain, this involves an extensive number  of treatment options, and is a complaint that carries with it a high risk of complications and morbidity.  The differential diagnosis includes cholecystitis, kidney stone, pyelo-, appendicitis, ovarian torsion    Additional history obtained:  Additional history obtained from N/A External records from outside source obtained and reviewed including recent ER note   Co morbidities that complicate the patient evaluation  Kidney stone  Social Determinants of Health:  N/a    Lab Tests:  I Ordered, and personally interpreted labs.  The pertinent results include: CBC unremarkable, BMP reveals glucose 113, creatinine 1.03, calcium 8.8, UA shows 5 ketones   Imaging Studies ordered:  I ordered imaging studies including CT and pelvis I independently visualized and interpreted imaging which showed 4 mm obstructing renal stone at the UVJ stable left with stable right adnexal cyst, ultrasound is negative for torsion  but shows a 7.5 cm cyst. I agree with the radiologist interpretation   Cardiac Monitoring:  The patient was maintained on a cardiac monitor.  I personally viewed and interpreted the cardiac monitored which showed an underlying rhythm of: N/A   Medicines ordered and prescription drug management:  I ordered medication including fluids, pain medication, antiemetics I have reviewed the patients home medicines and have made adjustments as needed  Critical Interventions:  N/A   Reevaluation:  Presents with flank pain, triage obtain basic lab workup which I personally viewed unremarkable, patient is noted flank pain, will obtain CT imaging for further assessment.  Reassessed updated on imaging, states that her pain has improved, but still has pain in the right flank, abdomen soft with minimal pain in the right flank and right lower quadrant, will further assess with ultrasound to rule out ovarian torsion.  Patient is reassessed she is resting comfortably, updated her on  imaging, patient states that she is following up with her OB/GYN next week we will likely remove the cyst.  Patient states that she is overall feeling much better, her abdomen soft nontender, agreement discharge at this time.    Consultations Obtained:  N/A    Test Considered:  N/A    Rule out Suspicion for UTI [place] or septic stone is very low at this time, UA is negative for signs of infection, patient is nontoxic-appearing, vital signs reassuring, no leukocytosis seen on CBC.  I doubt ovarian torsion as CT imaging as well as ultrasound is both negative for these findings, presentation seems more consistent with likely stone she does have a noted 4 mm stone endorsing hematuria, she has no vaginal discharge.  I doubt appendicitis, diverticulitis, bowel obstruction, volvulus, intra-abdominal infection, intra-abdominal mass, CT imaging is all negative these findings.  I doubt patient needs to be admitted for pain control or any emergent removal of stone as her pain is well-managed, no post bladder obstruction no evidence of infection.    Dispostion and problem list  After consideration of the diagnostic results and the patients response to treatment, I feel that the patent would benefit from discharge.  Right-sided kidney stone-will start patient back on Flomax, provide with additional pain meds nausea medication, and have her follow-up with her urologist for further evaluation. Right adnexal cyst-patient was made aware of these findings, will have her follow-up with her OB/GYN for further assessment.  Patient is given a copy of her ultrasound.            Final Clinical Impression(s) / ED Diagnoses Final diagnoses:  Ureterolithiasis  Right ovarian cyst    Rx / DC Orders ED Discharge Orders          Ordered    oxyCODONE-acetaminophen (PERCOCET/ROXICET) 5-325 MG tablet  Every 8 hours PRN        11/22/22 0434    ondansetron (ZOFRAN) 4 MG tablet  Every 6 hours         11/22/22 0434    tamsulosin (FLOMAX) 0.4 MG CAPS capsule  Daily        11/22/22 0434              Carroll Sage, PA-C 11/22/22 1610    Tilden Fossa, MD 11/22/22 [phone]

## 2022-11-22 NOTE — Discharge Instructions (Addendum)
Kidney stone-remains unchanged in right ureter, likely the pain is because it was moving causing slight blood in your urine.  I have given you pain medication as well as nausea medication please take as prescribed.  Also represcribed you Flomax to help dilate your hopefully help pass the stone.  Please follow-up with [facility] for further management.  Please be aware that Flomax can lower your blood pressure if he did feel lightheaded or dizziness while taking this medication please discontinue. Right ovarian cyst-noted again on your ultrasound, importantly follow-up with your OB/GYN for further assessment.  I have given you a short course of narcotics please take as prescribed.  This medication can make you drowsy do not consume alcohol or operate heavy machinery when taking this medication.  This medication is Tylenol in it do not take Tylenol and take this medication.    If you note that your symptoms are getting worse, i.e. worsening abdominal pain, unable to urinate, uncontrolled nausea vomiting, simply feel like you are not getting better please come back in for reassessment.

## 2022-11-22 NOTE — ED Notes (Signed)
Pt educated on DC instructions and verbalizes understanding, pt ambulatory from room to lobby, pt is waiting for her ride to arrive, pt appears to be in NAD at this time

## 2022-11-24 ENCOUNTER — Other Ambulatory Visit: Payer: Self-pay | Admitting: Urology

## 2022-12-06 ENCOUNTER — Encounter (HOSPITAL_BASED_OUTPATIENT_CLINIC_OR_DEPARTMENT_OTHER): Payer: Self-pay | Admitting: Urology

## 2022-12-06 NOTE — Progress Notes (Signed)
Spoke w/ via phone for pre-op interview--- Jenny Cohen Lab needs dos---- NONE              Lab results------Current EKG in Epic dated 09/22/22. COVID test -----patient states asymptomatic no test needed Arrive at -------0830 NPO after MN NO Solid Food.  Clear liquids from MN until---0730 Med rec completed Medications to take morning of surgery -----Xanax, Norco and Zofran PRN Diabetic medication ----- Patient instructed no nail polish to be worn day of surgery Patient instructed to bring photo id and insurance card day of surgery Patient aware to have Driver (ride ) / caregiver Mother Jenny Cohen   for 24 hours after surgery  Patient Special Instructions ----- Pre-Op special Instructions ----- Patient verbalized understanding of instructions that were given at this phone interview. Patient denies shortness of breath, chest pain, fever, cough at this phone interview.

## 2022-12-25 NOTE — H&P (Signed)
57 yo with h/o nephrolithiasis undergoing MET. Initially presented to ED with R flank pain in 05/24. Has been on MET for 1 month. went to the ED 2 days ago. CT scan performed with evidence of 4 mm stone at the right UVJ.   No significant fever, chills, nausea or vomiting. Did have some urinary symptoms such as frequency, urgency. She is not having flank pain today. Has not seen her stone pass.     ALLERGIES: None   MEDICATIONS: Iron  Multivtamin     GU PSH: Hysterectomy - 2014     NON-GU PSH: Cesarean Delivery - 1995         GU PMH: Renal and ureteral calculus - 10/12/2022    NON-GU PMH: Anxiety Hypercholesterolemia    FAMILY HISTORY: Hypercholesterolemia - Father, Mother Kidney Stones - Mother, Father    Notes: Father and mother with kidney stones   SOCIAL HISTORY: Marital Status: Single Preferred Language: English Current Smoking Status: Patient has never smoked.  <DIV'  Tobacco Use Assessment Completed:  Used Tobacco in last 30 days?   Does not use smokeless tobacco. Does drink.  Drinks 1 caffeinated drink per day. Patient's occupation Pension scheme manager.    REVIEW OF SYSTEMS:    GU Review Female:  Patient reports frequent urination. Patient denies hard to postpone urination, burning /pain with urination, get up at night to urinate, leakage of urine, stream starts and stops, trouble starting your stream, have to strain to urinate, and being pregnant.   Gastrointestinal (Upper):  Patient denies vomiting and indigestion/ heartburn.   Gastrointestinal (Lower):  Patient denies diarrhea and constipation.   Constitutional:  Patient denies fever, night sweats, weight loss, and fatigue.   Skin:  Patient denies skin rash/ lesion and itching.   Eyes:  Patient denies blurred vision and double vision.   Ears/ Nose/ Throat:  Patient denies sore throat and sinus problems.   Hematologic/Lymphatic:  Patient denies swollen glands and easy bruising.   Cardiovascular:  Patient denies  leg swelling and chest pains.   Respiratory:  Patient denies cough and shortness of breath.   Endocrine:  Patient denies excessive thirst.   Musculoskeletal:  Patient denies back pain and joint pain.   Neurological:  Patient denies headaches and dizziness.   Psychologic:  Patient denies depression and anxiety.   Notes: No hematuria    VITAL SIGNS: None   MULTI-SYSTEM PHYSICAL EXAMINATION:     Notes: Gen: NAD  Resp: NWOB on RA  Cards Reg rate  Abdomen-Soft NT  GU: No CVA tenderness  Neuro Alert         Complexity of Data:   Records Review:  Previous Hospital Records  X-Ray Review: C.T. Abdomen/Pelvis: Reviewed Films.     PROCEDURES:    Urinalysis  Dipstick Dipstick Cont'd  Color: Yellow Bilirubin: Neg mg/dL  Appearance: Clear Ketones: Neg mg/dL  Specific Gravity: 2.440 Blood: Neg ery/uL  pH: <=5.0 Protein: Neg mg/dL  Glucose: Neg mg/dL Urobilinogen: 0.2 mg/dL   Nitrites: Neg   Leukocyte Esterase: Neg leu/uL    ASSESSMENT:     ICD-10 Details  1 GU:  Renal and ureteral calculus - N20.2    PLAN:   Document  Letter(s):  Created for Patient: Clinical Summary   Notes:  #Right obstructing kidney stone.  CT from 6/27 4 mm R UVJ stone with upsteram hydronephrosis  -She has failed MET  -Plan for right ureteroscopic stone extraction next available. pt would like the stone removed given recent episode of  pain.  -Will plan to strain all urine and continue flomax. if passes stone while waiting for surgery, we will follow up with a RUS.  -if fever, chills, or severe flank pain, patient told to go to ED   Supervised by Dr Marlou Porch

## 2022-12-27 ENCOUNTER — Ambulatory Visit (HOSPITAL_BASED_OUTPATIENT_CLINIC_OR_DEPARTMENT_OTHER)
Admission: RE | Admit: 2022-12-27 | Discharge: 2022-12-27 | Disposition: A | Discharge status: Home / Self Care | Payer: Self-pay | Attending: Urology | Admitting: Urology

## 2022-12-27 ENCOUNTER — Encounter (HOSPITAL_BASED_OUTPATIENT_CLINIC_OR_DEPARTMENT_OTHER)
Admission: RE | Disposition: A | Discharge status: Home / Self Care | Payer: Self-pay | Source: Home / Self Care | Attending: Urology

## 2022-12-27 ENCOUNTER — Ambulatory Visit (HOSPITAL_BASED_OUTPATIENT_CLINIC_OR_DEPARTMENT_OTHER): Payer: Self-pay | Admitting: Anesthesiology

## 2022-12-27 ENCOUNTER — Encounter (HOSPITAL_BASED_OUTPATIENT_CLINIC_OR_DEPARTMENT_OTHER): Payer: Self-pay | Admitting: Urology

## 2022-12-27 DIAGNOSIS — N201 Calculus of ureter: Secondary | ICD-10-CM | POA: Insufficient documentation

## 2022-12-27 DIAGNOSIS — E785 Hyperlipidemia, unspecified: Secondary | ICD-10-CM

## 2022-12-27 DIAGNOSIS — Z6841 Body Mass Index (BMI) 40.0 and over, adult: Secondary | ICD-10-CM

## 2022-12-27 HISTORY — PX: CYSTOSCOPY/URETEROSCOPY/HOLMIUM LASER/STENT PLACEMENT: SHX6546

## 2022-12-27 HISTORY — DX: Personal history of urinary calculi: Z87.442

## 2022-12-27 SURGERY — CYSTOSCOPY/URETEROSCOPY/HOLMIUM LASER/STENT PLACEMENT
Anesthesia: General | Laterality: Right

## 2022-12-27 MED ORDER — CELECOXIB 200 MG PO CAPS: 200.0000 mg | ORAL_CAPSULE | Freq: Two times a day (BID) | ORAL | 1 refills | Status: AC

## 2022-12-27 MED ORDER — FENTANYL CITRATE (PF) 100 MCG/2ML IJ SOLN
INTRAMUSCULAR | Status: DC | PRN
  Administered 2022-12-27: 25 ug via INTRAVENOUS
  Administered 2022-12-27: 50 ug via INTRAVENOUS
  Administered 2022-12-27: 25 ug via INTRAVENOUS

## 2022-12-27 MED ORDER — SODIUM CHLORIDE 0.9 % IR SOLN
Status: DC | PRN
  Administered 2022-12-27: 3000 mL via INTRAVESICAL

## 2022-12-27 MED ORDER — OXYCODONE HCL 5 MG/5ML PO SOLN: 5.0000 mg | Freq: Once | ORAL | Status: DC | PRN

## 2022-12-27 MED ORDER — KETOROLAC TROMETHAMINE 30 MG/ML IJ SOLN
INTRAMUSCULAR | Status: DC | PRN
  Administered 2022-12-27: 30 mg via INTRAVENOUS

## 2022-12-27 MED ORDER — DEXMEDETOMIDINE HCL IN NACL 80 MCG/20ML IV SOLN
INTRAVENOUS | Status: DC | PRN
  Administered 2022-12-27: 4 ug via INTRAVENOUS

## 2022-12-27 MED ORDER — CIPROFLOXACIN IN D5W 400 MG/200ML IV SOLN
INTRAVENOUS | Status: AC
  Filled 2022-12-27: qty 200

## 2022-12-27 MED ORDER — FENTANYL CITRATE (PF) 100 MCG/2ML IJ SOLN
INTRAMUSCULAR | Status: AC
  Filled 2022-12-27: qty 2

## 2022-12-27 MED ORDER — EPHEDRINE SULFATE (PRESSORS) 50 MG/ML IJ SOLN
INTRAMUSCULAR | Status: DC | PRN
  Administered 2022-12-27: 5 mg via INTRAVENOUS

## 2022-12-27 MED ORDER — LIDOCAINE HCL (CARDIAC) PF 100 MG/5ML IV SOSY
PREFILLED_SYRINGE | INTRAVENOUS | Status: DC | PRN
  Administered 2022-12-27: 100 mg via INTRAVENOUS

## 2022-12-27 MED ORDER — LACTATED RINGERS IV SOLN: INTRAVENOUS | Status: DC

## 2022-12-27 MED ORDER — IOHEXOL 300 MG/ML  SOLN
INTRAMUSCULAR | Status: DC | PRN
  Administered 2022-12-27: 10 mL via URETHRAL

## 2022-12-27 MED ORDER — OXYCODONE HCL 5 MG PO TABS: 5.0000 mg | ORAL_TABLET | Freq: Once | ORAL | Status: DC | PRN

## 2022-12-27 MED ORDER — NITROFURANTOIN MONOHYD MACRO 100 MG PO CAPS: 100.0000 mg | ORAL_CAPSULE | Freq: Two times a day (BID) | ORAL | 0 refills | Status: AC

## 2022-12-27 MED ORDER — ACETAMINOPHEN 500 MG PO TABS
1000.0000 mg | ORAL_TABLET | Freq: Once | ORAL | Status: AC
  Administered 2022-12-27: 1000 mg via ORAL

## 2022-12-27 MED ORDER — CIPROFLOXACIN IN D5W 400 MG/200ML IV SOLN
400.0000 mg | INTRAVENOUS | Status: AC
  Administered 2022-12-27: 400 mg via INTRAVENOUS

## 2022-12-27 MED ORDER — PROPOFOL 10 MG/ML IV BOLUS
INTRAVENOUS | Status: AC
  Filled 2022-12-27: qty 20

## 2022-12-27 MED ORDER — ONDANSETRON HCL 4 MG/2ML IJ SOLN
INTRAMUSCULAR | Status: DC | PRN
  Administered 2022-12-27: 4 mg via INTRAVENOUS

## 2022-12-27 MED ORDER — MIDAZOLAM HCL 2 MG/2ML IJ SOLN
INTRAMUSCULAR | Status: DC | PRN
  Administered 2022-12-27: 2 mg via INTRAVENOUS

## 2022-12-27 MED ORDER — PROPOFOL 10 MG/ML IV BOLUS
INTRAVENOUS | Status: DC | PRN
  Administered 2022-12-27: 200 mg via INTRAVENOUS

## 2022-12-27 MED ORDER — FENTANYL CITRATE (PF) 100 MCG/2ML IJ SOLN: 25.0000 ug | INTRAMUSCULAR | Status: DC | PRN

## 2022-12-27 MED ORDER — DEXAMETHASONE SODIUM PHOSPHATE 4 MG/ML IJ SOLN
INTRAMUSCULAR | Status: DC | PRN
  Administered 2022-12-27: 8 mg via INTRAVENOUS

## 2022-12-27 MED ORDER — TRAMADOL HCL 50 MG PO TABS
50.0000 mg | ORAL_TABLET | Freq: Four times a day (QID) | ORAL | 0 refills | Status: DC | PRN
Start: 2022-12-27 — End: 2023-07-06

## 2022-12-27 MED ORDER — MIDAZOLAM HCL 2 MG/2ML IJ SOLN
INTRAMUSCULAR | Status: AC
  Filled 2022-12-27: qty 2

## 2022-12-27 MED ORDER — HYOSCYAMINE SULFATE 0.125 MG SL SUBL: 0.1250 mg | SUBLINGUAL_TABLET | Freq: Four times a day (QID) | SUBLINGUAL | 0 refills | Status: DC | PRN

## 2022-12-27 MED ORDER — AMISULPRIDE (ANTIEMETIC) 5 MG/2ML IV SOLN: 10.0000 mg | Freq: Once | INTRAVENOUS | Status: DC | PRN

## 2022-12-27 MED ORDER — ACETAMINOPHEN 500 MG PO TABS
ORAL_TABLET | ORAL | Status: AC
  Filled 2022-12-27: qty 2

## 2022-12-27 SURGICAL SUPPLY — 29 items
APL SKNCLS STERI-STRIP NONHPOA (GAUZE/BANDAGES/DRESSINGS)
BAG DRAIN URO-CYSTO SKYTR STRL (DRAIN) ×1 IMPLANT
BAG DRN UROCATH (DRAIN) ×1
BASKET ZERO TIP NITINOL 2.4FR (BASKET) IMPLANT
BENZOIN TINCTURE PRP APPL 2/3 (GAUZE/BANDAGES/DRESSINGS) IMPLANT
BSKT STON RTRVL ZERO TP 2.4FR (BASKET)
CATH URETL OPEN END 6FR 70 (CATHETERS) ×1 IMPLANT
CLOTH BEACON ORANGE TIMEOUT ST (SAFETY) ×1 IMPLANT
COVER DOME SNAP 22 D (MISCELLANEOUS) ×1 IMPLANT
DRSG TEGADERM 4X4.75 (GAUZE/BANDAGES/DRESSINGS) IMPLANT
ELECT REM PT RETURN 9FT ADLT (ELECTROSURGICAL)
ELECTRODE REM PT RTRN 9FT ADLT (ELECTROSURGICAL) IMPLANT
GLOVE BIO SURGEON STRL SZ7 (GLOVE) ×1 IMPLANT
GOWN STRL REUS W/TWL LRG LVL3 (GOWN DISPOSABLE) ×1 IMPLANT
GUIDEWIRE STR DUAL SENSOR (WIRE) IMPLANT
GUIDEWIRE ZIPWRE .038 STRAIGHT (WIRE) IMPLANT
IV NS IRRIG 3000ML ARTHROMATIC (IV SOLUTION) ×2 IMPLANT
KIT TURNOVER CYSTO (KITS) ×1 IMPLANT
LASER FIB FLEXIVA PULSE ID 365 (Laser) IMPLANT
MANIFOLD NEPTUNE II (INSTRUMENTS) ×1 IMPLANT
NS IRRIG 500ML POUR BTL (IV SOLUTION) ×1 IMPLANT
PACK CYSTO (CUSTOM PROCEDURE TRAY) ×1 IMPLANT
SHEATH NAVIGATOR HD 11/13X36 (SHEATH) IMPLANT
SLEEVE SCD COMPRESS KNEE MED (STOCKING) ×1 IMPLANT
STENT URET 6FRX24 CONTOUR (STENTS) IMPLANT
TRACTIP FLEXIVA PULS ID 200XHI (Laser) IMPLANT
TRACTIP FLEXIVA PULSE ID 200 (Laser)
TUBE CONNECTING 12X1/4 (SUCTIONS) IMPLANT
TUBING UROLOGY SET (TUBING) IMPLANT

## 2022-12-27 NOTE — H&P (Signed)
H&P  History of Present Illness: PASHEN JAMBOR is a 57 y.o. year old F who presents today for treatment of a right ureteral stone  No acute complaints  Past Medical History:  Diagnosis Date   Anemia    on meds   Anxiety    on meds   Arthritis    LEFT SHOULDER   Chicken pox    DUB (dysfunctional uterine bleeding)    Fibroids    Heart murmur    as a child   History of kidney stones    Hyperlipidemia    black seed oil   Morbid obesity (HCC)    BMI 47   Seasonal allergies     Past Surgical History:  Procedure Laterality Date   ABDOMINAL HYSTERECTOMY     CESAREAN SECTION     COLONOSCOPY  2021   HD-MAC-2 day miralax/suprep (good prep)-SSP x 1 TA x 1-3 yr recall   DILATION AND CURETTAGE OF UTERUS     ENDOMETRIAL ABLATION     LAPAROSCOPY  1990   with laparotomy   PARTIAL HYSTERECTOMY     POLYPECTOMY  2021   SSP x 1 TA  x 1   TONSILLECTOMY AND ADENOIDECTOMY     WISDOM TOOTH EXTRACTION      Home Medications:  Current Meds  Medication Sig   ALPRAZolam (XANAX) 0.25 MG tablet TAKE 1 TABLET(0.25 MG) BY MOUTH THREE TIMES DAILY AS NEEDED FOR ANXIETY   Ferrous Sulfate (IRON PO) Take 1 tablet by mouth daily.   ondansetron (ZOFRAN-ODT) 4 MG disintegrating tablet 4mg  ODT q4 hours prn nausea/vomit    Allergies:  Allergies  Allergen Reactions   Miconazole Nitrate Itching   Vagisil Itching    Family History  Problem Relation Age of Onset   Diabetes Mother    Sarcoidosis Mother    Colon polyps Father 36   Hyperlipidemia Father    Hypertension Father    Syncope episode Father    Pancreatic cancer Maternal Uncle    Alzheimer's disease Maternal Grandmother    Colon cancer Maternal Grandfather    Alzheimer's disease Maternal Grandfather    Heart failure Paternal Grandmother    Throat cancer Paternal Grandfather    Esophageal cancer Neg Hx    Rectal cancer Neg Hx    Stomach cancer Neg Hx     Social History:  reports that she has never smoked. She has never used  smokeless tobacco. She reports that she does not currently use alcohol. She reports that she does not use drugs.  ROS: A complete review of systems was performed.  All systems are negative except for pertinent findings as noted.  Physical Exam:  Vital signs in last 24 hours: Temp:  [98.1 F (36.7 C)] 98.1 F (36.7 C) (07/31 0842) Pulse Rate:  [71] 71 (07/31 0842) Resp:  [18] 18 (07/31 0842) BP: (138)/(64) 138/64 (07/31 0842) SpO2:  [98 %] 98 % (07/31 0842) Weight:  [129.4 kg] 129.4 kg (07/31 0842) Constitutional:  Alert and oriented, No acute distress Cardiovascular: Regular rate and rhythm Respiratory: Normal respiratory effort, Lungs clear bilaterally GI: Abdomen is soft, nontender, nondistended, no abdominal masses Lymphatic: No lymphadenopathy Neurologic: Grossly intact, no focal deficits Psychiatric: Normal mood and affect   Laboratory Data:  No results for input(s): "WBC", "HGB", "HCT", "PLT" in the last 72 hours.  No results for input(s): "NA", "K", "CL", "GLUCOSE", "BUN", "CALCIUM", "CREATININE" in the last 72 hours.  Invalid input(s): "CO3"   No results found for this or  any previous visit (from the past 24 hour(s)). No results found for this or any previous visit (from the past 240 hour(s)).  Renal Function: No results for input(s): "CREATININE" in the last 168 hours. CrCl cannot be calculated (Patient's most recent lab result is older than the maximum 21 days allowed.).  Radiologic Imaging: No results found.  Assessment:  DARBEY BORBON is a 57 y.o. year old F with right ureteral stone   Plan:  --to OR as planned for right ureteroscopy with laser litho, stent. Procedure and risks reviewed, including but not limited to hematuria, infection, sepsis, damage to GU tract, failure to complete procedure, retained stone fragments, need for future procedures, stent pain, prolonged stent.   Irine Seal, MD 12/27/2022, 10:42 AM  [facility] Pager:  [phone]

## 2022-12-27 NOTE — Anesthesia Procedure Notes (Deleted)
Procedure Name: LMA Insertion Date/Time: 12/27/2022 11:00 AM  Performed by: Earmon Phoenix, CRNAPre-anesthesia Checklist: Patient identified, Emergency Drugs available, Suction available, Patient being monitored and Timeout performed Patient Re-evaluated:Patient Re-evaluated prior to induction Oxygen Delivery Method: Circle system utilized Preoxygenation: Pre-oxygenation with 100% oxygen Induction Type: IV induction Ventilation: Mask ventilation without difficulty LMA: LMA inserted LMA Size: 4.0 Number of attempts: 1 Placement Confirmation: positive ETCO2 and breath sounds checked- equal and bilateral Tube secured with: Tape Dental Injury: Teeth and Oropharynx as per pre-operative assessment

## 2022-12-27 NOTE — Anesthesia Preprocedure Evaluation (Addendum)
Anesthesia Evaluation  Patient identified by MRN, date of birth, ID band Patient awake    Reviewed: Allergy & Precautions, NPO status , Patient's Chart, lab work & pertinent test results  History of Anesthesia Complications Negative for: history of anesthetic complications  Airway Mallampati: II  TM Distance: >3 FB Neck ROM: Full    Dental  (+) Dental Advisory Given   Pulmonary neg pulmonary ROS   Pulmonary exam normal breath sounds clear to auscultation       Cardiovascular (-) hypertension(-) angina (-) Past MI, (-) Cardiac Stents and (-) CABG (-) dysrhythmias + Valvular Problems/Murmurs (as a child)  Rhythm:Regular Rate:Normal  HLD   Neuro/Psych  PSYCHIATRIC DISORDERS Anxiety     negative neurological ROS     GI/Hepatic negative GI ROS, Neg liver ROS,,,  Endo/Other  neg diabetes  Morbid obesity  Renal/GU Renal disease (right ureteral stone)     Musculoskeletal  (+) Arthritis ,    Abdominal  (+) + obese  Peds  Hematology  (+) Blood dyscrasia, anemia   Anesthesia Other Findings   Reproductive/Obstetrics                             Anesthesia Physical Anesthesia Plan  ASA: 3  Anesthesia Plan: General   Post-op Pain Management: Tylenol PO (pre-op)*   Induction: Intravenous  PONV Risk Score and Plan: 3 and Ondansetron, Dexamethasone and Treatment may vary due to age or medical condition  Airway Management Planned: LMA  Additional Equipment:   Intra-op Plan:   Post-operative Plan: Extubation in OR  Informed Consent: I have reviewed the patients History and Physical, chart, labs and discussed the procedure including the risks, benefits and alternatives for the proposed anesthesia with the patient or authorized representative who has indicated his/her understanding and acceptance.     Dental advisory given  Plan Discussed with: CRNA and Anesthesiologist  Anesthesia Plan  Comments: (Risks of general anesthesia discussed including, but not limited to, sore throat, hoarse voice, chipped/damaged teeth, injury to vocal cords, nausea and vomiting, allergic reactions, lung infection, heart attack, stroke, and death. All questions answered. )        Anesthesia Quick Evaluation

## 2022-12-27 NOTE — Anesthesia Postprocedure Evaluation (Signed)
Anesthesia Post Note  Patient: Jenny Cohen  Procedure(s) Performed: CYSTOSCOPY, RIGHT URETEROSCOPY/ STENT PLACEMENT, Right retrogrades (Right)     Patient location during evaluation: PACU Anesthesia Type: General Level of consciousness: awake Pain management: pain level controlled Vital Signs Assessment: post-procedure vital signs reviewed and stable Respiratory status: spontaneous breathing, nonlabored ventilation and respiratory function stable Cardiovascular status: blood pressure returned to baseline and stable Postop Assessment: no apparent nausea or vomiting Anesthetic complications: no   No notable events documented.  Last Vitals:  Vitals:   12/27/22 1147 12/27/22 1200  BP: (!) 97/59 111/64  Pulse: 81 72  Resp: (!) 24 (!) 25  Temp: 36.4 C   SpO2: 99% 99%    Last Pain:  Vitals:   12/27/22 1200  TempSrc:   PainSc: 0-No pain                 Linton Rump

## 2022-12-27 NOTE — Discharge Instructions (Addendum)
[facility] [phone] Post Ureteroscopy With or Without Stent Instructions **remove stent by pulling on string Friday  Definitions:  Ureter: The duct that transports urine from the kidney to the bladder. Stent:   A plastic hollow tube that is placed into the ureter, from the kidney to the bladder to prevent the ureter from swelling shut.  GENERAL INSTRUCTIONS:  Despite the fact that no skin incisions were used, the area around the ureter and bladder is raw and irritated. The stent is a foreign body which will further irritate the bladder wall. This irritation is manifested by increased frequency of urination, both day and night, and by an increase in the urge to urinate. In some, the urge to urinate is present almost always. Sometimes the urge is strong enough that you may not be able to stop yourself from urinating. The only real cure is to remove the stent and then give time for the bladder wall to heal which can't be done until the danger of the ureter swelling shut has passed, which varies.  You may see some blood in your urine while the stent is in place and a few days afterwards. Do not be alarmed, even if the urine was clear for a while. Get off your feet and drink lots of fluids until clearing occurs. If you start to pass clots or don't improve, call us.  DIET: You may return to your normal diet immediately. Because of the raw surface of your bladder, alcohol, spicy foods, acid type foods and drinks with caffeine may cause irritation or frequency and should be used in moderation. To keep your urine flowing freely and to avoid constipation, drink plenty of fluids during the day ( 8-10 glasses ). Tip: Avoid cranberry juice because it is very acidic.  ACTIVITY: Your physical activity doesn't need to be restricted. However, if you are very active, you may see some blood in your urine. We suggest that you reduce your activity under these circumstances until the bleeding  has stopped.  BOWELS: It is important to keep your bowels regular during the postoperative period. Straining with bowel movements can cause bleeding. A bowel movement every other day is reasonable. Use a mild laxative if needed, such as Milk of Magnesia 2-3 tablespoons, or 2 Dulcolax tablets. Call if you continue to have problems. If you have been taking narcotics for pain, before, during or after your surgery, you may be constipated. Take a laxative if necessary.   MEDICATION: You should resume your pre-surgery medications unless told not to. In addition you will often be given an antibiotic to prevent infection and likely several as needed medications for stent related discomfort. These should be taken as prescribed until the bottles are finished unless you are having an unusual reaction to one of the drugs.  PROBLEMS YOU SHOULD REPORT TO [place]: Fevers over 100.5 Fahrenheit. Heavy bleeding, or clots ( See above notes about blood in urine ). Inability to urinate. Drug reactions ( hives, rash, nausea, vomiting, diarrhea ). Severe burning or pain with urination that is not improving.   No acetaminophen/Tylenol until after 3:00pm today if needed for pain.  No ibuprofen, Advil, Aleve, Motrin, ketorolac, meloxicam, naproxen, or other NSAIDS until after 5:30pm today if needed for pain.     Post Anesthesia Home Care Instructions  Activity: Get plenty of rest for the remainder of the day. A responsible individual must stay with you for 24 hours following the procedure.  For the next 24 hours, DO NOT: -Drive  a car -Advertising copywriter -Drink alcoholic beverages -Take any medication unless instructed by your physician -Make any legal decisions or sign important papers.  Meals: Start with liquid foods such as gelatin or soup. Progress to regular foods as tolerated. Avoid greasy, spicy, heavy foods. If nausea and/or vomiting occur, drink only clear liquids until the nausea and/or vomiting  subsides. Call your physician if vomiting continues.  Special Instructions/Symptoms: Your throat may feel dry or sore from the anesthesia or the breathing tube placed in your throat during surgery. If this causes discomfort, gargle with warm salt water. The discomfort should disappear within 24 hours.

## 2022-12-27 NOTE — Anesthesia Procedure Notes (Addendum)
Procedure Name: LMA Insertion Date/Time: 12/27/2022 11:00 AM  Performed by: Earmon Phoenix, CRNAPre-anesthesia Checklist: Patient identified, Emergency Drugs available, Suction available and Patient being monitored Patient Re-evaluated:Patient Re-evaluated prior to induction Oxygen Delivery Method: Circle system utilized Preoxygenation: Pre-oxygenation with 100% oxygen Induction Type: IV induction Ventilation: Mask ventilation without difficulty LMA: LMA inserted LMA Size: 4.0 Number of attempts: 1 Placement Confirmation: positive ETCO2 and breath sounds checked- equal and bilateral Tube secured with: Tape Dental Injury: Teeth and Oropharynx as per pre-operative assessment

## 2022-12-27 NOTE — Op Note (Signed)
Preoperative diagnosis: Right distal ureteral calculus  Postoperative diagnosis: Same  Procedure:  Cystoscopy Right diagnostic ureteroscopy  Right ureteral stent placement (6Fr x 24JJ) Right retrograde pyelography with interpretation  Surgeon: Irine Seal, MD  Anesthesia: General  Complications: None  Intraoperative findings:  -Right retrograde pyelography demonstrated no evidence of renal dilation. No filling defects were observed.  -Right ureter without evidence of stone.   EBL: Minimal  Specimens: None  Disposition of specimens: [facility] for stone analysis  Indication: Jenny Cohen  is a 57 y.o. patient with urolithiasis. After reviewing the management options for treatment, they elected to proceed with the above surgical procedure(s). We have discussed the potential benefits and risks of the procedure, side effects of the proposed treatment, the likelihood of the patient achieving the goals of the procedure, and any potential problems that might occur during the procedure or recuperation. Informed consent has been obtained.  Description of procedure:  The patient was taken to the operating room and general anesthesia was induced.  The patient was placed in the dorsal lithotomy position, prepped and draped in the usual sterile fashion, and preoperative antibiotics were administered. A preoperative time-out was performed.   Cystourethroscopy was performed.  The patient's urethra was examined and was normal. The bladder was then systematically examined in its entirety. There was no evidence for any bladder tumors, stones, or other mucosal pathology.    Attention then turned to the right ureteral orifice and a ureteral catheter was used to intubate the ureteral orifice.  Omnipaque contrast was injected through the ureteral catheter and a retrograde pyelogram was performed with findings as dictated above.  A 0.38 sensor guidewire was then advanced up the  right ureter into the renal pelvis under fluoroscopic guidance. Semirigid ureteroscope was used alongside the wire to evaluate the ureter. The semirigid ureteroscope was advanced to the right UPJ. There was no evidence of a ureteral stone. The ureter was examined once more on the way out of the kidney. Again there was no evidence of stone.   The decision was made to place a ureteral stent. Over the guide wire, a 6Fr x 24cm JJ stent on a string was advanced. The stent was positioned appropriately under fluoroscopic guidance.  The wire was then removed with an adequate stent curl noted in the renal pelvis as well as in the bladder.  The bladder was then emptied and the procedure ended.  The patient appeared to tolerate the procedure well and without complications.  The patient was able to be awakened and transferred to the recovery unit in satisfactory condition.   Jenny Kief, MD, PhD [facility] Resident  [id number] [facility]   Plan: -Pt to remove ureteral stent on string on Friday 8/2. -Plan to follow up in 3-4 weeks with a renal ultrasound.

## 2022-12-27 NOTE — Transfer of Care (Signed)
Immediate Anesthesia Transfer of Care Note  Patient: Jenny Cohen  Procedure(s) Performed: CYSTOSCOPY, RIGHT URETEROSCOPY/ STENT PLACEMENT, Right retrogrades (Right)  Patient Location: PACU  Anesthesia Type:General  Level of Consciousness: awake, alert , and patient cooperative  Airway & Oxygen Therapy: Patient Spontanous Breathing and Patient connected to nasal cannula oxygen  Post-op Assessment: Report given to RN and Post -op Vital signs reviewed and stable  Post vital signs: Reviewed and stable  Last Vitals:  Vitals Value Taken Time  BP    Temp    Pulse 83 12/27/22 1148  Resp    SpO2 98 % 12/27/22 1148  Vitals shown include unfiled device data.  Last Pain:  Vitals:   12/27/22 0842  TempSrc: Oral  PainSc: 0-No pain      Patients Stated Pain Goal: 5 (12/27/22 6295)  Complications: No notable events documented.

## 2022-12-28 ENCOUNTER — Encounter (HOSPITAL_BASED_OUTPATIENT_CLINIC_OR_DEPARTMENT_OTHER): Payer: Self-pay | Admitting: Urology

## 2023-02-16 ENCOUNTER — Ambulatory Visit (INDEPENDENT_AMBULATORY_CARE_PROVIDER_SITE_OTHER): Payer: Self-pay | Admitting: Family

## 2023-02-16 VITALS — BP 134/78 | HR 70 | Ht 65.0 in | Wt 289.6 lb

## 2023-02-16 DIAGNOSIS — L739 Follicular disorder, unspecified: Secondary | ICD-10-CM

## 2023-02-16 DIAGNOSIS — Z23 Encounter for immunization: Secondary | ICD-10-CM

## 2023-02-16 MED ORDER — DOXYCYCLINE HYCLATE 100 MG PO TABS: 100.0000 mg | ORAL_TABLET | Freq: Two times a day (BID) | ORAL | 0 refills | Status: DC

## 2023-02-16 MED ORDER — FLUCONAZOLE 150 MG PO TABS
ORAL_TABLET | ORAL | 0 refills | Status: DC
Start: 2023-02-16 — End: 2023-03-08

## 2023-02-16 NOTE — Progress Notes (Unsigned)
Jenny Cohen is a 57 y.o. female with the following history as recorded in EpicCare:  Patient Active Problem List   Diagnosis Date Noted   Acute non-recurrent pansinusitis 03/28/2021   Bronchitis 03/28/2021   Acute vaginitis 03/28/2021   Preventative health care 04/12/2020   Hyperlipidemia 04/12/2020   Cyst of right ovary 04/12/2020   Cough 09/28/2019   Anxiety 02/13/2017    Current Outpatient Medications  Medication Sig Dispense Refill   ALPRAZolam (XANAX) 0.25 MG tablet TAKE 1 TABLET(0.25 MG) BY MOUTH THREE TIMES DAILY AS NEEDED FOR ANXIETY 30 tablet 1   cholecalciferol (VITAMIN D3) 25 MCG (1000 UNIT) tablet Take 1,000 Units by mouth daily.     doxycycline (VIBRA-TABS) 100 MG tablet Take 1 tablet (100 mg total) by mouth 2 (two) times daily. 14 tablet 0   Ferrous Sulfate (IRON PO) Take 1 tablet by mouth daily.     fluconazole (DIFLUCAN) 150 MG tablet Take 1 tablet as directed; repeat after 72 hours 2 tablet 0   vitamin B-12 (CYANOCOBALAMIN) 100 MCG tablet Take 100 mcg by mouth daily.     hyoscyamine (LEVSIN/SL) 0.125 MG SL tablet Place 1 tablet (0.125 mg total) under the tongue every 6 (six) hours as needed (bladder spasms, stent pain). (Patient not taking: Reported on 02/16/2023) 20 tablet 0   ondansetron (ZOFRAN) 4 MG tablet Take 1 tablet (4 mg total) by mouth every 6 (six) hours. (Patient not taking: Reported on 02/16/2023) 12 tablet 0   ondansetron (ZOFRAN-ODT) 4 MG disintegrating tablet 4mg  ODT q4 hours prn nausea/vomit (Patient not taking: Reported on 02/16/2023) 8 tablet 0   traMADol (ULTRAM) 50 MG tablet Take 1 tablet (50 mg total) by mouth every 6 (six) hours as needed. (Patient not taking: Reported on 02/16/2023) 12 tablet 0   No current facility-administered medications for this visit.    Allergies: Miconazole nitrate and Vagisil  Past Medical History:  Diagnosis Date   Anemia    on meds   Anxiety    on meds   Arthritis    LEFT SHOULDER   Chicken pox    DUB  (dysfunctional uterine bleeding)    Fibroids    Heart murmur    as a child   History of kidney stones    Hyperlipidemia    black seed oil   Morbid obesity (HCC)    BMI 47   Seasonal allergies     Past Surgical History:  Procedure Laterality Date   ABDOMINAL HYSTERECTOMY     CESAREAN SECTION     COLONOSCOPY  2021   HD-MAC-2 day miralax/suprep (good prep)-SSP x 1 TA x 1-3 yr recall   CYSTOSCOPY/URETEROSCOPY/HOLMIUM LASER/STENT PLACEMENT Right 12/27/2022   Procedure: CYSTOSCOPY, RIGHT URETEROSCOPY/ STENT PLACEMENT, Right retrogrades;  Surgeon: Despina Arias, MD;  [facility];  Service: Urology;  Laterality: Right;  60 MINUTES NEEDED FOR CASE   DILATION AND CURETTAGE OF UTERUS     ENDOMETRIAL ABLATION     LAPAROSCOPY  1990   with laparotomy   PARTIAL HYSTERECTOMY     POLYPECTOMY  2021   SSP x 1 TA  x 1   TONSILLECTOMY AND ADENOIDECTOMY     WISDOM TOOTH EXTRACTION      Family History  Problem Relation Age of Onset   Diabetes Mother    Sarcoidosis Mother    Colon polyps Father 32   Hyperlipidemia Father    Hypertension Father    Syncope episode Father    Pancreatic cancer Maternal  Uncle    Alzheimer's disease Maternal Grandmother    Colon cancer Maternal Grandfather    Alzheimer's disease Maternal Grandfather    Heart failure Paternal Grandmother    Throat cancer Paternal Grandfather    Esophageal cancer Neg Hx    Rectal cancer Neg Hx    Stomach cancer Neg Hx     Social History   Tobacco Use   Smoking status: Never   Smokeless tobacco: Never  Substance Use Topics   Alcohol use: Not Currently    Comment: social    Subjective:   Presents today with concerns for possible skin infection; noticed "sore" on her buttock; wanted to be sure that does not have MRSA;  Would also like to start Shingles vaccine series;   Objective:  Vitals:   02/16/23 1329  BP: 134/78  Pulse: 70  SpO2: 98%  Weight: 289 lb 9.6 oz (131.4 kg)  Height: 5\' 5"   (1.651 m)    General: Well developed, well nourished, in no acute distress  Skin : Warm and dry. Small scabbed lesion noted on buttock- no warmth, redness, streaking Head: Normocephalic and atraumatic  Lungs: Respirations unlabored;  Neurologic: Alert and oriented; speech intact; face symmetrical; moves all extremities well; CNII-XII intact without focal deficit   Assessment:  1. Folliculitis   2. Need for shingles vaccine     Plan:  Rx for Doxycycline 100 mg bid x 7 days- reassurance but will treat due to upcoming surgery; Shingrix #1 given- return for 2nd vaccine in 2 months.   No follow-ups on file.  Orders Placed This Encounter  Procedures   Zoster Recombinant (Shingrix )    Requested Prescriptions   Signed Prescriptions Disp Refills   doxycycline (VIBRA-TABS) 100 MG tablet 14 tablet 0    Sig: Take 1 tablet (100 mg total) by mouth 2 (two) times daily.   fluconazole (DIFLUCAN) 150 MG tablet 2 tablet 0    Sig: Take 1 tablet as directed; repeat after 72 hours

## 2023-02-27 ENCOUNTER — Encounter (HOSPITAL_BASED_OUTPATIENT_CLINIC_OR_DEPARTMENT_OTHER): Payer: Self-pay | Admitting: Obstetrics and Gynecology

## 2023-02-27 NOTE — Progress Notes (Signed)
Spoke w/ via phone for pre-op interview--- Dorene Grebe Lab needs dos---- NONE        Lab results------Current EKG dated 09/22/22 in Epic COVID test -----patient states asymptomatic no test needed Arrive at -------0855 NPO after MN NO Solid Food.  Clear liquids from MN until---0755 Med rec completed Medications to take morning of surgery -----Xanax PRN Diabetic medication ----- Patient instructed no nail polish to be worn day of surgery Patient instructed to bring photo id and insurance card day of surgery Patient aware to have Driver (ride ) / caregiver    for 24 hours after surgery - [place] Patient Special Instructions ----- Pre-Op special Instructions ----- Patient verbalized understanding of instructions that were given at this phone interview. Patient denies chest pain, sob, fever, cough at the interview.

## 2023-03-07 NOTE — H&P (Signed)
Jenny Cohen is an 57 y.o. female presenting for scheduled procedure  Pertinent Gynecological History: Menses:  s/p hyst Contraception: status post hysterectomy DES exposure: denies Sexually transmitted diseases: no past history Previous GYN Procedures:  Ablation   Last mammogram: normal Date: 2022 Last pap: s/p hyst OB History: G1, P1001   Menstrual History: Patient's last menstrual period was 07/14/2013.    Past Medical History:  Diagnosis Date   Anemia    on meds   Anxiety    on meds   Arthritis    LEFT SHOULDER   Chicken pox    DUB (dysfunctional uterine bleeding)    Fibroids    Heart murmur    as a child   History of kidney stones    Hyperlipidemia    black seed oil   Morbid obesity (HCC)    BMI 47   Seasonal allergies     Past Surgical History:  Procedure Laterality Date   ABDOMINAL HYSTERECTOMY     CESAREAN SECTION     COLONOSCOPY  2021   HD-MAC-2 day miralax/suprep (good prep)-SSP x 1 TA x 1-3 yr recall   CYSTOSCOPY/URETEROSCOPY/HOLMIUM LASER/STENT PLACEMENT Right 12/27/2022   Procedure: CYSTOSCOPY, RIGHT URETEROSCOPY/ STENT PLACEMENT, Right retrogrades;  Surgeon: Despina Arias, MD;  Location: [facility];  Service: Urology;  Laterality: Right;  60 MINUTES NEEDED FOR CASE   DILATION AND CURETTAGE OF UTERUS     ENDOMETRIAL ABLATION     LAPAROSCOPY  1990   with laparotomy   PARTIAL HYSTERECTOMY     POLYPECTOMY  2021   SSP x 1 TA  x 1   TONSILLECTOMY AND ADENOIDECTOMY     WISDOM TOOTH EXTRACTION      Family History  Problem Relation Age of Onset   Diabetes Mother    Sarcoidosis Mother    Colon polyps Father 60   Hyperlipidemia Father    Hypertension Father    Syncope episode Father    Pancreatic cancer Maternal Uncle    Alzheimer's disease Maternal Grandmother    Colon cancer Maternal Grandfather    Alzheimer's disease Maternal Grandfather    Heart failure Paternal Grandmother    Throat cancer Paternal Grandfather     Esophageal cancer Neg Hx    Rectal cancer Neg Hx    Stomach cancer Neg Hx     Social History:  reports that she has never smoked. She has never used smokeless tobacco. She reports that she does not currently use alcohol. She reports that she does not use drugs.  Allergies:  Allergies  Allergen Reactions   Miconazole Nitrate Itching   Vagisil Itching    No medications prior to admission.    Review of Systems  Constitutional:  Negative for chills and fever.  Respiratory:  Negative for shortness of breath.   Cardiovascular:  Negative for chest pain, palpitations and leg swelling.  Gastrointestinal:  Negative for abdominal pain, nausea and vomiting.  Genitourinary:  Positive for pelvic pain (right).  Neurological:  Negative for dizziness, weakness and headaches.  Psychiatric/Behavioral:  Negative for suicidal ideas.     Height 5\' 5"  (1.651 m), weight 131.1 kg, last menstrual period 07/14/2013. Physical Exam Gen: NAD CV: CTAB, RRR Abd: Pfannenstiel healed, some RLQ TTP (mild) GU deferred Psych Neuro WNL  TVUS: Measurements: 6.8 cm x 5.0 cm x 4.2 cm = volume: 74.9 mL. A 5.5 cm x  5.4 cm x 3.6 cm complex right ovarian cyst is seen. This is seen on  the prior  exam (measured 4.9 cm x 2.3 cm x 2.2 cm on the prior  study). [TVUS] -Reproductive: Status post hysterectomy. A stable 7.2 cm x 4.5 cm  septated cyst is seen within the anteromedial aspect of the right  adnexa. The left adnexa is unremarkable.  No results found for this or any previous visit (from the past 24 hour(s)).  No results found.  Assessment/Plan: This is a 57yo G1P1001 s/p TAH/LSO for AUB-L here for laparoscopic right oophorectomy for symptomatic ovarian cyst. Plan for intra-op washing. Risks of surgery include infection of the uterus, pelvic organs, or skin, inadvertent injury to internal organs, such as bowel or bladder. If there is major injury, extensive surgery may be required. If injury is minor, it  may be treated with relative ease. Discussed possibility of excessive blood loss and transfusion. Patient accepts the possibility of blood transfusion, if necessary. Patient understands and agrees to move forward with surgery.  Jenny Cohen Jenny Cohen 03/07/2023, 5:13 PM

## 2023-03-08 ENCOUNTER — Ambulatory Visit (HOSPITAL_BASED_OUTPATIENT_CLINIC_OR_DEPARTMENT_OTHER)
Admission: RE | Admit: 2023-03-08 | Discharge: 2023-03-08 | Disposition: A | Discharge status: Home / Self Care | Payer: Medicaid Other | Attending: Obstetrics and Gynecology | Admitting: Obstetrics and Gynecology

## 2023-03-08 ENCOUNTER — Encounter (HOSPITAL_BASED_OUTPATIENT_CLINIC_OR_DEPARTMENT_OTHER): Payer: Self-pay | Admitting: Obstetrics and Gynecology

## 2023-03-08 ENCOUNTER — Other Ambulatory Visit: Payer: Self-pay

## 2023-03-08 ENCOUNTER — Encounter (HOSPITAL_BASED_OUTPATIENT_CLINIC_OR_DEPARTMENT_OTHER)
Admission: RE | Disposition: A | Discharge status: Home / Self Care | Payer: Self-pay | Source: Home / Self Care | Attending: Obstetrics and Gynecology

## 2023-03-08 ENCOUNTER — Ambulatory Visit (HOSPITAL_BASED_OUTPATIENT_CLINIC_OR_DEPARTMENT_OTHER): Payer: Medicaid Other | Admitting: Anesthesiology

## 2023-03-08 DIAGNOSIS — N83201 Unspecified ovarian cyst, right side: Secondary | ICD-10-CM | POA: Diagnosis present

## 2023-03-08 DIAGNOSIS — N838 Other noninflammatory disorders of ovary, fallopian tube and broad ligament: Secondary | ICD-10-CM | POA: Insufficient documentation

## 2023-03-08 DIAGNOSIS — F419 Anxiety disorder, unspecified: Secondary | ICD-10-CM | POA: Insufficient documentation

## 2023-03-08 DIAGNOSIS — D649 Anemia, unspecified: Secondary | ICD-10-CM | POA: Insufficient documentation

## 2023-03-08 DIAGNOSIS — K660 Peritoneal adhesions (postprocedural) (postinfection): Secondary | ICD-10-CM | POA: Insufficient documentation

## 2023-03-08 DIAGNOSIS — E785 Hyperlipidemia, unspecified: Secondary | ICD-10-CM | POA: Insufficient documentation

## 2023-03-08 DIAGNOSIS — Z6841 Body Mass Index (BMI) 40.0 and over, adult: Secondary | ICD-10-CM | POA: Diagnosis not present

## 2023-03-08 DIAGNOSIS — M1990 Unspecified osteoarthritis, unspecified site: Secondary | ICD-10-CM | POA: Diagnosis not present

## 2023-03-08 DIAGNOSIS — R102 Pelvic and perineal pain: Secondary | ICD-10-CM | POA: Insufficient documentation

## 2023-03-08 HISTORY — PX: LAPAROSCOPIC SALPINGO OOPHERECTOMY: SHX5927

## 2023-03-08 LAB — CBC
HCT: 41.2 % (ref 36.0–46.0)
Hemoglobin: 13.1 g/dL (ref 12.0–15.0)
MCH: 28.5 pg (ref 26.0–34.0)
MCHC: 31.8 g/dL (ref 30.0–36.0)
MCV: 89.8 fL (ref 80.0–100.0)
Platelets: 403 10*3/uL — ABNORMAL HIGH (ref 150–400)
RBC: 4.59 MIL/uL (ref 3.87–5.11)
RDW: 13.8 % (ref 11.5–15.5)
WBC: 5.9 10*3/uL (ref 4.0–10.5)
nRBC: 0 % (ref 0.0–0.2)

## 2023-03-08 LAB — TYPE AND SCREEN
ABO/RH(D): A POS
Antibody Screen: NEGATIVE

## 2023-03-08 LAB — ABO/RH: ABO/RH(D): A POS

## 2023-03-08 SURGERY — SALPINGO-OOPHORECTOMY, LAPAROSCOPIC
Anesthesia: General | Site: Pelvis | Laterality: Right

## 2023-03-08 MED ORDER — ACETAMINOPHEN 160 MG/5ML PO SOLN: 325.0000 mg | ORAL | Status: DC | PRN

## 2023-03-08 MED ORDER — ONDANSETRON HCL 4 MG/2ML IJ SOLN
INTRAMUSCULAR | Status: AC
  Filled 2023-03-08: qty 2

## 2023-03-08 MED ORDER — IBUPROFEN 800 MG PO TABS: 800.0000 mg | ORAL_TABLET | Freq: Three times a day (TID) | ORAL | 0 refills | Status: AC | PRN

## 2023-03-08 MED ORDER — OXYCODONE HCL 5 MG/5ML PO SOLN: 5.0000 mg | Freq: Once | ORAL | Status: AC | PRN

## 2023-03-08 MED ORDER — MIDAZOLAM HCL 2 MG/2ML IJ SOLN
INTRAMUSCULAR | Status: AC
  Filled 2023-03-08: qty 2

## 2023-03-08 MED ORDER — MIDAZOLAM HCL 5 MG/5ML IJ SOLN
INTRAMUSCULAR | Status: DC | PRN
  Administered 2023-03-08: 2 mg via INTRAVENOUS

## 2023-03-08 MED ORDER — LACTATED RINGERS IV SOLN: INTRAVENOUS | Status: DC

## 2023-03-08 MED ORDER — ROCURONIUM BROMIDE 10 MG/ML (PF) SYRINGE
PREFILLED_SYRINGE | INTRAVENOUS | Status: DC | PRN
  Administered 2023-03-08: 50 mg via INTRAVENOUS

## 2023-03-08 MED ORDER — FENTANYL CITRATE (PF) 100 MCG/2ML IJ SOLN
INTRAMUSCULAR | Status: DC | PRN
  Administered 2023-03-08: 100 ug via INTRAVENOUS
  Administered 2023-03-08: 50 ug via INTRAVENOUS

## 2023-03-08 MED ORDER — SUGAMMADEX SODIUM 200 MG/2ML IV SOLN
INTRAVENOUS | Status: DC | PRN
  Administered 2023-03-08: 300 mg via INTRAVENOUS

## 2023-03-08 MED ORDER — ACETAMINOPHEN 500 MG PO TABS
ORAL_TABLET | ORAL | Status: AC
  Filled 2023-03-08: qty 2

## 2023-03-08 MED ORDER — OXYCODONE HCL 5 MG PO TABS
ORAL_TABLET | ORAL | Status: AC
  Filled 2023-03-08: qty 1

## 2023-03-08 MED ORDER — BUPIVACAINE HCL (PF) 0.25 % IJ SOLN
INTRAMUSCULAR | Status: DC | PRN
  Administered 2023-03-08: 10 mL

## 2023-03-08 MED ORDER — OXYCODONE HCL 5 MG PO TABS
5.0000 mg | ORAL_TABLET | Freq: Once | ORAL | Status: AC | PRN
  Administered 2023-03-08: 5 mg via ORAL

## 2023-03-08 MED ORDER — DEXAMETHASONE SODIUM PHOSPHATE 10 MG/ML IJ SOLN
INTRAMUSCULAR | Status: AC
  Filled 2023-03-08: qty 1

## 2023-03-08 MED ORDER — FENTANYL CITRATE (PF) 250 MCG/5ML IJ SOLN
INTRAMUSCULAR | Status: AC
  Filled 2023-03-08: qty 5

## 2023-03-08 MED ORDER — KETOROLAC TROMETHAMINE 30 MG/ML IJ SOLN
INTRAMUSCULAR | Status: DC | PRN
  Administered 2023-03-08: 30 mg via INTRAVENOUS

## 2023-03-08 MED ORDER — ACETAMINOPHEN 325 MG PO TABS: 325.0000 mg | ORAL_TABLET | ORAL | Status: DC | PRN

## 2023-03-08 MED ORDER — SODIUM CHLORIDE 0.9 % IR SOLN
Status: DC | PRN
  Administered 2023-03-08: 300 mL

## 2023-03-08 MED ORDER — LIDOCAINE 2% (20 MG/ML) 5 ML SYRINGE
INTRAMUSCULAR | Status: DC | PRN
  Administered 2023-03-08: 100 mg via INTRAVENOUS

## 2023-03-08 MED ORDER — DEXAMETHASONE SODIUM PHOSPHATE 10 MG/ML IJ SOLN
INTRAMUSCULAR | Status: DC | PRN
  Administered 2023-03-08: 10 mg via INTRAVENOUS

## 2023-03-08 MED ORDER — LIDOCAINE HCL (PF) 2 % IJ SOLN
INTRAMUSCULAR | Status: AC
  Filled 2023-03-08: qty 5

## 2023-03-08 MED ORDER — POVIDONE-IODINE 10 % EX SWAB: 2.0000 | Freq: Once | CUTANEOUS | Status: DC

## 2023-03-08 MED ORDER — ONDANSETRON HCL 4 MG/2ML IJ SOLN
4.0000 mg | Freq: Once | INTRAMUSCULAR | Status: AC | PRN
  Administered 2023-03-08: 4 mg via INTRAVENOUS

## 2023-03-08 MED ORDER — MEPERIDINE HCL 25 MG/ML IJ SOLN: 6.2500 mg | INTRAMUSCULAR | Status: DC | PRN

## 2023-03-08 MED ORDER — PROPOFOL 10 MG/ML IV BOLUS
INTRAVENOUS | Status: DC | PRN
  Administered 2023-03-08: 200 mg via INTRAVENOUS

## 2023-03-08 MED ORDER — FENTANYL CITRATE (PF) 100 MCG/2ML IJ SOLN
25.0000 ug | INTRAMUSCULAR | Status: DC | PRN
  Administered 2023-03-08 (×2): 50 ug via INTRAVENOUS

## 2023-03-08 MED ORDER — ACETAMINOPHEN 500 MG PO TABS
1000.0000 mg | ORAL_TABLET | ORAL | Status: AC
  Administered 2023-03-08: 1000 mg via ORAL

## 2023-03-08 MED ORDER — FENTANYL CITRATE (PF) 100 MCG/2ML IJ SOLN
INTRAMUSCULAR | Status: AC
  Filled 2023-03-08: qty 2

## 2023-03-08 MED ORDER — 0.9 % SODIUM CHLORIDE (POUR BTL) OPTIME
TOPICAL | Status: DC | PRN
Start: 2023-03-08 — End: 2023-03-08
  Administered 2023-03-08: 500 mL

## 2023-03-08 MED ORDER — KETOROLAC TROMETHAMINE 30 MG/ML IJ SOLN
INTRAMUSCULAR | Status: AC
  Filled 2023-03-08: qty 1

## 2023-03-08 MED ORDER — ONDANSETRON HCL 4 MG/2ML IJ SOLN
INTRAMUSCULAR | Status: DC | PRN
  Administered 2023-03-08: 4 mg via INTRAVENOUS

## 2023-03-08 MED ORDER — ROCURONIUM BROMIDE 10 MG/ML (PF) SYRINGE
PREFILLED_SYRINGE | INTRAVENOUS | Status: AC
  Filled 2023-03-08: qty 10

## 2023-03-08 SURGICAL SUPPLY — 40 items
ADH SKN CLS APL DERMABOND .7 (GAUZE/BANDAGES/DRESSINGS) ×1
DERMABOND ADVANCED .7 DNX12 (GAUZE/BANDAGES/DRESSINGS) ×1 IMPLANT
DRAPE SURG IRRIG POUCH 19X23 (DRAPES) ×1 IMPLANT
DRSG OPSITE POSTOP 3X4 (GAUZE/BANDAGES/DRESSINGS) IMPLANT
DURAPREP 26ML APPLICATOR (WOUND CARE) ×1 IMPLANT
GAUZE 4X4 16PLY ~~LOC~~+RFID DBL (SPONGE) ×1 IMPLANT
GLOVE BIOGEL PI IND STRL 6.5 (GLOVE) ×2 IMPLANT
GLOVE ECLIPSE 6.5 STRL STRAW (GLOVE) ×1 IMPLANT
GOWN STRL REUS W/TWL LRG LVL3 (GOWN DISPOSABLE) ×2 IMPLANT
IRRIG SUCT STRYKERFLOW 2 WTIP (MISCELLANEOUS) ×1
IRRIGATION SUCT STRKRFLW 2 WTP (MISCELLANEOUS) ×1 IMPLANT
KIT PINK PAD W/HEAD ARE REST (MISCELLANEOUS) ×1
KIT PINK PAD W/HEAD ARM REST (MISCELLANEOUS) ×1 IMPLANT
KIT TURNOVER CYSTO (KITS) ×1 IMPLANT
LIGASURE VESSEL 5MM BLUNT TIP (ELECTROSURGICAL) IMPLANT
NDL INSUFFLATION 14GA 120MM (NEEDLE) ×1 IMPLANT
NDL SPNL 22GX3.5 QUINCKE BK (NEEDLE) IMPLANT
NEEDLE INSUFFLATION 14GA 120MM (NEEDLE) ×1 IMPLANT
NEEDLE SPNL 22GX3.5 QUINCKE BK (NEEDLE) ×1 IMPLANT
NS IRRIG 1000ML POUR BTL (IV SOLUTION) ×1 IMPLANT
PACK LAPAROSCOPY BASIN (CUSTOM PROCEDURE TRAY) ×1 IMPLANT
POUCH LAPAROSCOPIC INSTRUMENT (MISCELLANEOUS) ×1 IMPLANT
PROTECTOR NERVE ULNAR (MISCELLANEOUS) ×2 IMPLANT
SCISSORS LAP 5X35 DISP (ENDOMECHANICALS) IMPLANT
SET TUBE SMOKE EVAC HIGH FLOW (TUBING) ×1 IMPLANT
SHEARS HARMONIC 36 ACE (MISCELLANEOUS) IMPLANT
SLEEVE SCD COMPRESS KNEE MED (STOCKING) ×1 IMPLANT
SLEEVE Z-THREAD 5X100MM (TROCAR) IMPLANT
SUT VIC AB 3-0 PS2 18 (SUTURE) ×1
SUT VIC AB 3-0 PS2 18XBRD (SUTURE) ×1 IMPLANT
SUT VICRYL 0 UR6 27IN ABS (SUTURE) ×1 IMPLANT
SYS BAG RETRIEVAL 10MM (BASKET)
SYS RETRIEVAL 5MM INZII UNIV (BASKET) ×1
SYSTEM BAG RETRIEVAL 10MM (BASKET) IMPLANT
SYSTEM RETRIEVL 5MM INZII UNIV (BASKET) IMPLANT
TOWEL OR 17X24 6PK STRL BLUE (TOWEL DISPOSABLE) ×1 IMPLANT
TRAY FOLEY W/BAG SLVR 14FR LF (SET/KITS/TRAYS/PACK) ×1 IMPLANT
TROCAR Z-THREAD FIOS 11X100 BL (TROCAR) IMPLANT
TROCAR Z-THREAD FIOS 5X100MM (TROCAR) ×2 IMPLANT
WARMER LAPAROSCOPE (MISCELLANEOUS) ×1 IMPLANT

## 2023-03-08 NOTE — Discharge Instructions (Signed)
No acetaminophen/Tylenol until after 3:10 pm today if needed.  No ibuprofen, Advil, Aleve, Motrin, ketorolac, meloxicam, naproxen, or other NSAIDS until after 6:10 pm today if needed.     Post Anesthesia Home Care Instructions  Activity: Get plenty of rest for the remainder of the day. A responsible individual must stay with you for 24 hours following the procedure.  For the next 24 hours, DO NOT: -Drive a car -Advertising copywriter -Drink alcoholic beverages -Take any medication unless instructed by your physician -Make any legal decisions or sign important papers.  Meals: Start with liquid foods such as gelatin or soup. Progress to regular foods as tolerated. Avoid greasy, spicy, heavy foods. If nausea and/or vomiting occur, drink only clear liquids until the nausea and/or vomiting subsides. Call your physician if vomiting continues.  Special Instructions/Symptoms: Your throat may feel dry or sore from the anesthesia or the breathing tube placed in your throat during surgery. If this causes discomfort, gargle with warm salt water. The discomfort should disappear within 24 hours.

## 2023-03-08 NOTE — Interval H&P Note (Signed)
History and Physical Interval Note:  03/08/2023 10:18 AM  Jenny Cohen  has presented today for surgery, with the diagnosis of symptomatic right cyst.  The various methods of treatment have been discussed with the patient and family. After consideration of risks, benefits and other options for treatment, the patient has consented to  Procedure(s): LAPAROSCOPIC SALPINGO OOPHORECTOMY (Right) as a surgical intervention.  The patient's history has been reviewed, patient examined, no change in status, stable for surgery.  I have reviewed the patient's chart and labs.  Questions were answered to the patient's satisfaction.     Aquiles Ruffini M Elizah Mierzwa

## 2023-03-08 NOTE — Op Note (Signed)
Surgeon: Ellison Hughs, MD Assistant: Pryor Ochoa, DO Preoperative Diagnosis: Symptomatic right adnexal mass Postoperative Diagnosis: Dilated right fallopian tube, intra-abdominal adhesions Procedure: 1) Diagnostic laparoscopy with pelvic washings 2) Adhesiolysis 3) Laparoscopic right salpingo-oophorectomy Anesthesia: GETA by Dr Tacy Dura IVF: 400cc LR EBL: <50cc UOP: 400cc clear urine Complications: none Specimen: right fallopian tube and ovary; pelvic washings, dilated tube serous contents Findings: Surgically absent uterus, left fallopian tube and ovary. Midline omental adhesions from umbilicus superior to falciform ligament, no evidence of entrapped bowel. Peritoneal adhesions from bowel to BL pelvic sidewall. Dilated, tortuous right fallopian tube, right ovaries atrophic and small c/w postmenopausal state.Thickened parietal peritoneum surrounding right fallopian tube    Indications. Jenny Cohen is a 57yo G1P 1001 with symptomatic right adnexal mass. S/p TAH/LS previously for AUB-L. Given age and chronic pain, desires surgical management.   Operative Procedure: The patient was taken to the operating room where a time out was performed to confirm patient and correct procedure. The patient was given no antibiotics in accordance with ACOG guidelines based on weight. General anesthesia was established. The patient was then positioned on the operating table in the dorsal lithotomy position with the legs supported using stirrups. All pressure points were padded and a Bair hugger was placed to maintain control of core body temperature.    The patient was then prepped and draped in the usual sterile fashion. A Foley catheter was inserted in normal sterile fashion. Attention was then turned the abdomen with 0.25% marcaine plain was injected subdermally infraumbilically. A 5mm vertical incision was made infraumbilically. A 5mm trochar and sleeve were inserted through the incision using direct  visualization with laparoscope. Once intraabdominal entry was confirmed. Pneumoperitoneum was established.. Subsequently, two other small incision were made in left and right lower quadrants, both 2cm above and 2cm medial to corresponding ASIS's. LLQ site 5mm, RLQ site 5mm. Airseal port introduced in RLQ, 5mm trochar introduced in LLQ. Pelvic washings carried out without issue.  Adhesiolysis performed using Ligasure device as described above. Once right fallopian tube and ovary were freed, placed in 5mm Endocatch bag placed in LLQ. Brought to skin and subsequently decompressed using syringe and spinal needle under visualization in order to send fluid off to cytology. Specimen with drawn from bag and passed off table, bag removed without issue. Right pelvic sidewall inspected with good hemostasis. Appendix seen, freely mobile and non-inflamed. Able to visualize peristalsis ureter well below operative field.    Remaining trochars were removed without issue and pneumoperitoneum was evacuated. Skin was closed using 4-0 Monocryl and Dermabond overlying.   The patient was transferred to recovery room in stable condition. All needle, sponge ad instrument counts were noted to be correct. x2 at end of procedure.

## 2023-03-08 NOTE — Anesthesia Postprocedure Evaluation (Signed)
Anesthesia Post Note  Patient: Jenny Cohen  Procedure(s) Performed: LAPAROSCOPIC SALPINGO OOPHORECTOMY (Right: Pelvis)     Patient location during evaluation: PACU Anesthesia Type: General Level of consciousness: awake and alert Pain management: pain level controlled Vital Signs Assessment: post-procedure vital signs reviewed and stable Respiratory status: spontaneous breathing, nonlabored ventilation, respiratory function stable and patient connected to nasal cannula oxygen Cardiovascular status: blood pressure returned to baseline and stable Postop Assessment: no apparent nausea or vomiting Anesthetic complications: no   No notable events documented.  Last Vitals:  Vitals:   03/08/23 1302 03/08/23 1345  BP: (!) 104/56 (!) 109/57  Pulse: 66 63  Resp: 13 14  Temp:  36.5 C  SpO2: 99% 95%    Last Pain:  Vitals:   03/08/23 1345  TempSrc:   PainSc: 5                  Meah Jiron

## 2023-03-08 NOTE — Transfer of Care (Signed)
Immediate Anesthesia Transfer of Care Note  Patient: Jenny Cohen  Procedure(s) Performed: Procedure(s) (LRB): LAPAROSCOPIC SALPINGO OOPHORECTOMY (Right)  Patient Location: PACU  Anesthesia Type: General  Level of Consciousness: awake, oriented, sedated and patient cooperative  Airway & Oxygen Therapy: Patient Spontanous Breathing and Patient connected to face mask oxygen  Post-op Assessment: Report given to PACU RN and Post -op Vital signs reviewed and stable  Post vital signs: Reviewed and stable  Complications: No apparent anesthesia complications Last Vitals:  Vitals Value Taken Time  BP 97/51 03/08/23 1218  Temp    Pulse 66 03/08/23 1220  Resp 11 03/08/23 1220  SpO2 100 % 03/08/23 1220  Vitals shown include unfiled device data.  Last Pain:  Vitals:   03/08/23 0903  TempSrc: Oral      Patients Stated Pain Goal: 5 (03/08/23 0903)  Complications: No notable events documented.

## 2023-03-08 NOTE — Anesthesia Procedure Notes (Signed)
Procedure Name: Intubation Date/Time: 03/08/2023 11:01 AM  Performed by: Briant Sites, CRNAPre-anesthesia Checklist: Patient identified, Emergency Drugs available, Suction available and Patient being monitored Patient Re-evaluated:Patient Re-evaluated prior to induction Oxygen Delivery Method: Circle system utilized Preoxygenation: Pre-oxygenation with 100% oxygen Induction Type: IV induction Ventilation: Mask ventilation without difficulty Laryngoscope Size: Mac and 3 Grade View: Grade II Tube type: Oral Tube size: 7.0 mm Number of attempts: 1 Airway Equipment and Method: Stylet and Oral airway Placement Confirmation: ETT inserted through vocal cords under direct vision, positive ETCO2 and breath sounds checked- equal and bilateral Secured at: 20 cm Tube secured with: Tape Dental Injury: Teeth and Oropharynx as per pre-operative assessment

## 2023-03-08 NOTE — Anesthesia Preprocedure Evaluation (Signed)
Anesthesia Evaluation  Patient identified by MRN, date of birth, ID band Patient awake    Reviewed: Allergy & Precautions, NPO status , Patient's Chart, lab work & pertinent test results  History of Anesthesia Complications Negative for: history of anesthetic complications  Airway Mallampati: II  TM Distance: >3 FB Neck ROM: Full    Dental  (+) Dental Advisory Given, Teeth Intact   Pulmonary neg pulmonary ROS   Pulmonary exam normal breath sounds clear to auscultation       Cardiovascular (-) hypertension(-) angina (-) Past MI, (-) Cardiac Stents and (-) CABG (-) dysrhythmias + Valvular Problems/Murmurs (as a child)  Rhythm:Regular Rate:Normal  HLD   Neuro/Psych  PSYCHIATRIC DISORDERS Anxiety     negative neurological ROS     GI/Hepatic negative GI ROS, Neg liver ROS,,,  Endo/Other  neg diabetes  Morbid obesity  Renal/GU Renal disease (right ureteral stone)     Musculoskeletal  (+) Arthritis ,    Abdominal  (+) + obese  Peds  Hematology  (+) Blood dyscrasia, anemia   Anesthesia Other Findings   Reproductive/Obstetrics                             Anesthesia Physical Anesthesia Plan  ASA: 3  Anesthesia Plan: General   Post-op Pain Management: Tylenol PO (pre-op)*   Induction: Intravenous  PONV Risk Score and Plan: 3 and Ondansetron, Dexamethasone and Treatment may vary due to age or medical condition  Airway Management Planned: Oral ETT  Additional Equipment: None  Intra-op Plan:   Post-operative Plan: Extubation in OR  Informed Consent: I have reviewed the patients History and Physical, chart, labs and discussed the procedure including the risks, benefits and alternatives for the proposed anesthesia with the patient or authorized representative who has indicated his/her understanding and acceptance.     Dental advisory given  Plan Discussed with: CRNA and  Anesthesiologist  Anesthesia Plan Comments: (Risks of general anesthesia discussed including, but not limited to, sore throat, hoarse voice, chipped/damaged teeth, injury to vocal cords, nausea and vomiting, allergic reactions, lung infection, heart attack, stroke, and death. All questions answered. )        Anesthesia Quick Evaluation

## 2023-03-09 ENCOUNTER — Encounter (HOSPITAL_BASED_OUTPATIENT_CLINIC_OR_DEPARTMENT_OTHER): Payer: Self-pay | Admitting: Obstetrics and Gynecology

## 2023-03-09 LAB — SURGICAL PATHOLOGY

## 2023-03-09 LAB — CYTOLOGY - NON PAP

## 2023-04-12 ENCOUNTER — Other Ambulatory Visit: Payer: Self-pay | Admitting: Family Medicine

## 2023-04-12 DIAGNOSIS — Z1231 Encounter for screening mammogram for malignant neoplasm of breast: Secondary | ICD-10-CM

## 2023-04-30 ENCOUNTER — Telehealth (INDEPENDENT_AMBULATORY_CARE_PROVIDER_SITE_OTHER): Payer: Self-pay | Admitting: Family Medicine

## 2023-04-30 ENCOUNTER — Encounter: Payer: Self-pay | Admitting: Family Medicine

## 2023-04-30 DIAGNOSIS — J0140 Acute pansinusitis, unspecified: Secondary | ICD-10-CM

## 2023-04-30 MED ORDER — PROMETHAZINE-DM 6.25-15 MG/5ML PO SYRP
5.0000 mL | ORAL_SOLUTION | Freq: Four times a day (QID) | ORAL | 0 refills | Status: DC | PRN
Start: 2023-04-30 — End: 2024-04-03

## 2023-04-30 MED ORDER — AMOXICILLIN-POT CLAVULANATE 875-125 MG PO TABS
1.0000 | ORAL_TABLET | Freq: Two times a day (BID) | ORAL | 0 refills | Status: DC
Start: 2023-04-30 — End: 2023-07-06

## 2023-04-30 MED ORDER — FLUTICASONE PROPIONATE 50 MCG/ACT NA SUSP
2.0000 | Freq: Every day | NASAL | 6 refills | Status: AC
Start: 2023-04-30 — End: ?

## 2023-04-30 NOTE — Progress Notes (Signed)
MyChart Video Visit    Virtual Visit via Video Note   This patient is at least at moderate risk for complications without adequate follow up. This format is felt to be most appropriate for this patient at this time. Physical exam was limited by quality of the video and audio technology used for the visit. Jenny Cohen was able to get the patient set up on a video visit.  Patient location: home Patient and provider in visit Provider location: Office  I discussed the limitations of evaluation and management by telemedicine and the availability of in person appointments. The patient expressed understanding and agreed to proceed.  Visit Date: 04/30/2023  Today's healthcare provider: Donato Schultz, DO     Subjective:    Patient ID: Jenny Cohen, female    DOB: [birth year], 57 y.o.   MRN: [id number]  No chief complaint on file.   HPI Patient is in today for c/o congestion and sinus pressure x 1 week  she is taking otc with no relief and has run out of flonase. No fever, + sore throat and cough No covid test   Past Medical History:  Diagnosis Date   Anemia    on meds   Anxiety    on meds   Arthritis    LEFT SHOULDER   Chicken pox    DUB (dysfunctional uterine bleeding)    Fibroids    Heart murmur    as a child   History of kidney stones    Hyperlipidemia    black seed oil   Morbid obesity (HCC)    BMI 47   Seasonal allergies     Past Surgical History:  Procedure Laterality Date   ABDOMINAL HYSTERECTOMY     CESAREAN SECTION     COLONOSCOPY  2021   HD-MAC-2 day miralax/suprep (good prep)-SSP x 1 TA x 1-3 yr recall   CYSTOSCOPY/URETEROSCOPY/HOLMIUM LASER/STENT PLACEMENT Right 12/27/2022   Procedure: CYSTOSCOPY, RIGHT URETEROSCOPY/ STENT PLACEMENT, Right retrogrades;  Surgeon: Despina Arias, MD;  Location: [facility];  Service: Urology;  Laterality: Right;  60 MINUTES NEEDED FOR CASE   DILATION AND CURETTAGE OF UTERUS     ENDOMETRIAL  ABLATION     LAPAROSCOPIC SALPINGO OOPHERECTOMY Right 03/08/2023   Procedure: LAPAROSCOPIC SALPINGO OOPHORECTOMY;  Surgeon: Carlisle Cater, MD;  [facility];  Service: Gynecology;  Laterality: Right;   LAPAROSCOPY  1990   with laparotomy   PARTIAL HYSTERECTOMY     POLYPECTOMY  2021   SSP x 1 TA  x 1   TONSILLECTOMY AND ADENOIDECTOMY     WISDOM TOOTH EXTRACTION      Family History  Problem Relation Age of Onset   Diabetes Mother    Sarcoidosis Mother    Colon polyps Father 11   Hyperlipidemia Father    Hypertension Father    Syncope episode Father    Pancreatic cancer Maternal Uncle    Alzheimer's disease Maternal Grandmother    Colon cancer Maternal Grandfather    Alzheimer's disease Maternal Grandfather    Heart failure Paternal Grandmother    Throat cancer Paternal Grandfather    Esophageal cancer Neg Hx    Rectal cancer Neg Hx    Stomach cancer Neg Hx     Social History   Socioeconomic History   Marital status: Single    Spouse name: Not on file   Number of children: Not on file   Years of education: Not on file  Highest education level: Some college, no degree  Occupational History    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 [place]  Tobacco Use   Smoking status: Never   Smokeless tobacco: Never  Vaping Use   Vaping status: Never Used  Substance and Sexual Activity   Alcohol use: Not Currently    Comment: social   Drug use: No   Sexual activity: Yes    Partners: Male    Birth control/protection: Condom, Surgical  Other Topics Concern   Not on file  Social History Narrative   Exercise--= daily 5 days a week    Social Determinants of Health   Financial Resource Strain: Low Risk  (02/16/2023)   Overall Financial Resource Strain (CARDIA)    Difficulty of Paying Living Expenses: Not very hard  Food Insecurity: No Food Insecurity (02/16/2023)   Hunger Vital Sign    Worried About Running Out of Food in the Last Year: Never true     Ran Out of Food in the Last Year: Never true  Transportation Needs: No Transportation Needs (02/16/2023)   PRAPARE - Administrator, Civil Service (Medical): No    Lack of Transportation (Non-Medical): No  Physical Activity: Insufficiently Active (02/16/2023)   Exercise Vital Sign    Days of Exercise per Week: 3 days    Minutes of Exercise per Session: 30 min  Stress: No Stress Concern Present (02/16/2023)   [employer] of Occupational Health - Occupational Stress Questionnaire    Feeling of Stress : Only a little  Social Connections: Moderately Integrated (02/16/2023)   Social Connection and Isolation Panel [NHANES]    Frequency of Communication with Friends and Family: More than three times a week    Frequency of Social Gatherings with Friends and Family: Once a week    Attends Religious Services: More than 4 times per year    Active Member of [employer] or Organizations: Yes    Attends Banker Meetings: 1 to 4 times per year    Marital Status: Divorced  Catering manager Violence: Not on file    Outpatient Medications Prior to Visit  Medication Sig Dispense Refill   ALPRAZolam (XANAX) 0.25 MG tablet TAKE 1 TABLET(0.25 MG) BY MOUTH THREE TIMES DAILY AS NEEDED FOR ANXIETY 30 tablet 1   cholecalciferol (VITAMIN D3) 25 MCG (1000 UNIT) tablet Take 1,000 Units by mouth daily.     Ferrous Sulfate (IRON PO) Take 1 tablet by mouth daily.     ibuprofen (ADVIL) 800 MG tablet Take 1 tablet (800 mg total) by mouth every 8 (eight) hours as needed. 30 tablet 0   traMADol (ULTRAM) 50 MG tablet Take 1 tablet (50 mg total) by mouth every 6 (six) hours as needed. (Patient not taking: Reported on 02/16/2023) 12 tablet 0   vitamin B-12 (CYANOCOBALAMIN) 100 MCG tablet Take 100 mcg by mouth daily.     No facility-administered medications prior to visit.    Allergies  Allergen Reactions   Miconazole Nitrate Itching   Vagisil Itching    Review of Systems  Constitutional:   Negative for fever and malaise/fatigue.  HENT:  Positive for congestion, sinus pain and sore throat.   Eyes:  Negative for blurred vision.  Respiratory:  Positive for cough. Negative for shortness of breath.   Cardiovascular:  Negative for chest pain, palpitations and leg swelling.  Gastrointestinal:  Negative for abdominal pain, blood in stool, nausea and vomiting.  Genitourinary:  Negative for dysuria and frequency.  Musculoskeletal:  Negative  for back pain and falls.  Skin:  Negative for rash.  Neurological:  Negative for dizziness, loss of consciousness and headaches.  Endo/Heme/Allergies:  Negative for environmental allergies.  Psychiatric/Behavioral:  Negative for depression. The patient is not nervous/anxious.        Objective:    Physical Exam Vitals and nursing note reviewed.  Constitutional:      General: She is not in acute distress. HENT:     Nose: Congestion present.     Right Sinus: Maxillary sinus tenderness present.     Left Sinus: Maxillary sinus tenderness present.  Pulmonary:     Effort: Pulmonary effort is normal.     LMP 07/14/2013  Wt Readings from Last 3 Encounters:  03/08/23 290 lb 12.8 oz (131.9 kg)  02/16/23 289 lb 9.6 oz (131.4 kg)  12/27/22 285 lb 3.2 oz (129.4 kg)       Assessment & Plan:  Acute non-recurrent pansinusitis Assessment & Plan: Augmentin  Flonase Phenergan dm F/u as needed   Orders: -     Amoxicillin-Pot Clavulanate; Take 1 tablet by mouth 2 (two) times daily.  Dispense: 20 tablet; Refill: 0 -     Fluticasone Propionate; Place 2 sprays into both nostrils daily.  Dispense: 16 g; Refill: 6 -     Promethazine-DM; Take 5 mLs by mouth 4 (four) times daily as needed.  Dispense: 118 mL; Refill: 0     I discussed the assessment and treatment plan with the patient. The patient was provided an opportunity to ask questions and all were answered. The patient agreed with the plan and demonstrated an understanding of the instructions.    The patient was advised to call back or seek an in-person evaluation if the symptoms worsen or if the condition fails to improve as anticipated.  Donato Schultz, DO [facility] [phone] (phone) [phone] (fax)  [facility]

## 2023-04-30 NOTE — Assessment & Plan Note (Signed)
Augmentin  Flonase Phenergan dm F/u as needed

## 2023-05-08 ENCOUNTER — Ambulatory Visit
Admission: RE | Admit: 2023-05-08 | Discharge: 2023-05-08 | Disposition: A | Discharge status: Home / Self Care | Payer: No Typology Code available for payment source | Source: Ambulatory Visit

## 2023-05-08 DIAGNOSIS — Z1231 Encounter for screening mammogram for malignant neoplasm of breast: Secondary | ICD-10-CM

## 2023-05-30 DIAGNOSIS — Z419 Encounter for procedure for purposes other than remedying health state, unspecified: Secondary | ICD-10-CM | POA: Diagnosis not present

## 2023-06-21 ENCOUNTER — Other Ambulatory Visit (HOSPITAL_BASED_OUTPATIENT_CLINIC_OR_DEPARTMENT_OTHER): Payer: Self-pay

## 2023-06-21 MED ORDER — ZOSTER VAC RECOMB ADJUVANTED 50 MCG/0.5ML IM SUSR
0.5000 mL | Freq: Once | INTRAMUSCULAR | 0 refills | Status: AC
Start: 2023-06-21 — End: 2023-06-22
  Filled 2023-06-21: qty 0.5, 1d supply, fill #0

## 2023-07-06 ENCOUNTER — Ambulatory Visit (INDEPENDENT_AMBULATORY_CARE_PROVIDER_SITE_OTHER): Payer: Medicaid Other | Admitting: Family Medicine

## 2023-07-06 ENCOUNTER — Other Ambulatory Visit (HOSPITAL_BASED_OUTPATIENT_CLINIC_OR_DEPARTMENT_OTHER): Payer: Self-pay

## 2023-07-06 VITALS — BP 108/60 | HR 77 | Temp 98.1°F | Resp 18 | Ht 65.0 in | Wt 273.2 lb

## 2023-07-06 DIAGNOSIS — Z23 Encounter for immunization: Secondary | ICD-10-CM

## 2023-07-06 DIAGNOSIS — H02401 Unspecified ptosis of right eyelid: Secondary | ICD-10-CM | POA: Diagnosis not present

## 2023-07-06 DIAGNOSIS — R739 Hyperglycemia, unspecified: Secondary | ICD-10-CM | POA: Diagnosis not present

## 2023-07-06 DIAGNOSIS — R600 Localized edema: Secondary | ICD-10-CM | POA: Diagnosis not present

## 2023-07-06 DIAGNOSIS — Z833 Family history of diabetes mellitus: Secondary | ICD-10-CM

## 2023-07-06 DIAGNOSIS — F419 Anxiety disorder, unspecified: Secondary | ICD-10-CM

## 2023-07-06 DIAGNOSIS — Z0000 Encounter for general adult medical examination without abnormal findings: Secondary | ICD-10-CM | POA: Diagnosis not present

## 2023-07-06 DIAGNOSIS — Z820 Family history of epilepsy and other diseases of the nervous system: Secondary | ICD-10-CM

## 2023-07-06 MED ORDER — ALPRAZOLAM 0.25 MG PO TABS: ORAL_TABLET | ORAL | 1 refills | Status: AC

## 2023-07-06 MED ORDER — INFLUENZA VIRUS VACC SPLIT PF (FLUZONE) 0.5 ML IM SUSY
0.5000 mL | PREFILLED_SYRINGE | Freq: Once | INTRAMUSCULAR | 0 refills | Status: AC
  Filled 2023-07-06: qty 0.5, 1d supply, fill #0

## 2023-07-06 MED ORDER — ZEPBOUND 2.5 MG/0.5ML ~~LOC~~ SOAJ
2.5000 mg | SUBCUTANEOUS | 0 refills | Status: AC
Start: 2023-07-06 — End: ?

## 2023-07-06 MED ORDER — HYDROCHLOROTHIAZIDE 25 MG PO TABS
25.0000 mg | ORAL_TABLET | Freq: Every day | ORAL | 3 refills | Status: AC
Start: 2023-07-06 — End: ?

## 2023-07-06 NOTE — Assessment & Plan Note (Signed)
 Ghm utd Check labs  See AVS  Health Maintenance  Topic Date Due   Cervical Cancer Screening (HPV/Pap Cotest)  12/21/2017   COVID-19 Vaccine (4 - 2024-25 season) 01/28/2023   INFLUENZA VACCINE  08/27/2023 (Originally 12/28/2022)   MAMMOGRAM  05/07/2024   DTaP/Tdap/Td (2 - Td or Tdap) 12/21/2024   Colonoscopy  09/19/2027   Hepatitis C Screening  Completed   HIV Screening  Completed   Zoster Vaccines- Shingrix   Completed   HPV VACCINES  Aged Out

## 2023-07-06 NOTE — Progress Notes (Signed)
 Established Patient Office Visit  Subjective   Patient ID: Jenny Cohen, female    DOB: 11-24-1965  Age: 58 y.o. MRN: [id number]  Chief Complaint  Patient presents with   Annual Exam    Pt states not fasting     HPI Discussed the use of AI scribe software for clinical note transcription with the patient, who gave verbal consent to proceed.  History of Present Illness   Jenny Cohen is a 58 year old female who presents for an annual physical exam.  She experiences anxiety, which has been manageable but has increased due to a project management class involving PowerPoint presentations and project work. She manages her anxiety by alternating between medication and ibuprofen , especially when feeling overwhelmed by her classwork.  She has a drooping right eyelid, noticed after her brother pointed it out. There is no associated pain or vision changes. Her mother had a similar condition and underwent surgery, which was partially effective due to its hereditary nature.  She experiences occasional swelling in her ankles, particularly when wearing certain socks or after prolonged sitting. She uses a pedal exerciser while sitting at her desk to help with circulation. She has a history of hairline fractures in the past.  She takes several supplements including vitamin D3, B12, iron, a probiotic, vitamin C, and magnesium. She notes an improvement in her energy levels and less sensitivity to cold since resuming iron supplementation.  She reports no new surgeries since her last ovary removal on October 10th, performed at [facility]. She exercises 30 minutes a day, five days a week, although she recently resumed after a period of inactivity due to cold weather.  Her family history includes diabetes, cancer, hypertension, and hypercholesterolemia. Her father mentioned that her mother might have had a mini-stroke last July, and she is currently seeing a cardiologist and neurologist.  No  changes in her family history, alcohol or drug use. 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      Patient Active Problem List   Diagnosis Date Noted   Acute non-recurrent pansinusitis 03/28/2021   Bronchitis 03/28/2021   Acute vaginitis 03/28/2021   Preventative health care 04/12/2020   Hyperlipidemia 04/12/2020   Cyst of right ovary 04/12/2020   Cough 09/28/2019   Anxiety 02/13/2017   Past Medical History:  Diagnosis Date   Anemia    on meds   Anxiety    on meds   Arthritis    LEFT SHOULDER   Chicken pox    DUB (dysfunctional uterine bleeding)    Fibroids    Heart murmur    as a child   History of kidney stones    Hyperlipidemia    black seed oil   Morbid obesity (HCC)    BMI 47   Seasonal allergies    Past Surgical History:  Procedure Laterality Date   ABDOMINAL HYSTERECTOMY     CESAREAN SECTION     COLONOSCOPY  2021   HD-MAC-2 day miralax/suprep (good prep)-SSP x 1 TA x 1-3 yr recall   CYSTOSCOPY/URETEROSCOPY/HOLMIUM LASER/STENT PLACEMENT Right 12/27/2022   Procedure: CYSTOSCOPY, RIGHT URETEROSCOPY/ STENT PLACEMENT, Right retrogrades;  Surgeon: Lovie Arlyss CROME, MD;  Location: [facility];  Service: Urology;  Laterality: Right;  60 MINUTES NEEDED FOR CASE   DILATION AND CURETTAGE OF UTERUS     ENDOMETRIAL ABLATION     LAPAROSCOPIC SALPINGO OOPHERECTOMY Right  03/08/2023   Procedure: LAPAROSCOPIC SALPINGO OOPHORECTOMY;  Surgeon: Sudie Lavonia HERO, MD;  Location: [facility];  Service: Gynecology;  Laterality: Right;   LAPAROSCOPY  1990   with laparotomy   PARTIAL HYSTERECTOMY     POLYPECTOMY  2021   SSP x 1 TA  x 1   TONSILLECTOMY AND ADENOIDECTOMY     WISDOM TOOTH EXTRACTION     Social History   Tobacco Use   Smoking status: Never   Smokeless tobacco: Never  Vaping Use   Vaping status: Never Used  Substance Use Topics    Alcohol use: Not Currently    Comment: social   Drug use: No   Social History   Socioeconomic History   Marital status: Single    Spouse name: Not on file   Number of children: Not on file   Years of education: Not on file   Highest education level: Some college, no degree  Occupational History    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 [place]  Tobacco Use   Smoking status: Never   Smokeless tobacco: Never  Vaping Use   Vaping status: Never Used  Substance and Sexual Activity   Alcohol use: Not Currently    Comment: social   Drug use: No   Sexual activity: Yes    Partners: Male    Birth control/protection: Condom, Surgical  Other Topics Concern   Not on file  Social History Narrative   Exercise--= daily 5 days a week    Social Drivers of Corporate Investment Banker Strain: Low Risk  (07/06/2023)   Overall Financial Resource Strain (CARDIA)    Difficulty of Paying Living Expenses: Not very hard  Food Insecurity: No Food Insecurity (07/06/2023)   Hunger Vital Sign    Worried About Running Out of Food in the Last Year: Never true    Ran Out of Food in the Last Year: Never true  Transportation Needs: No Transportation Needs (07/06/2023)   PRAPARE - Administrator, Civil Service (Medical): No    Lack of Transportation (Non-Medical): No  Physical Activity: Insufficiently Active (07/06/2023)   Exercise Vital Sign    Days of Exercise per Week: 3 days    Minutes of Exercise per Session: 30 min  Stress: No Stress Concern Present (07/06/2023)   [employer] of Occupational Health - Occupational Stress Questionnaire    Feeling of Stress : Only a little  Social Connections: Moderately Integrated (07/06/2023)   Social Connection and Isolation Panel [NHANES]    Frequency of Communication with Friends and Family: Twice a week    Frequency of Social Gatherings with Friends and Family: Once a week    Attends Religious Services: More than 4 times per year    Active Member  of [employer] or Organizations: Yes    Attends Engineer, Structural: More than 4 times per year    Marital Status: Divorced  Catering Manager Violence: Not on file   Family Status  Relation Name Status   Mother  Alive   Father  Alive   Brother  Alive   Mat Uncle  (Not Specified)   MGM  Deceased   MGF  Deceased   PGM  Alive   PGF  Deceased   Neg Hx  (Not Specified)  No partnership data on file   Family History  Problem Relation Age of Onset   Diabetes Mother    Sarcoidosis Mother    Colon polyps Father 104   Hyperlipidemia  Father    Hypertension Father    Syncope episode Father    Pancreatic cancer Maternal Uncle    Alzheimer's disease Maternal Grandmother    Colon cancer Maternal Grandfather    Alzheimer's disease Maternal Grandfather    Heart failure Paternal Grandmother    Throat cancer Paternal Grandfather    Esophageal cancer Neg Hx    Rectal cancer Neg Hx    Stomach cancer Neg Hx    Allergies  Allergen Reactions   Miconazole Nitrate Itching   Vagisil Itching      ROS    Objective:     BP 108/60 (BP Location: Left Arm, Patient Position: Sitting, Cuff Size: Large)   Pulse 77   Temp 98.1 F (36.7 C) (Oral)   Resp 18   Ht 5' 5 (1.651 m)   Wt 273 lb 3.2 oz (123.9 kg)   LMP 07/14/2013   SpO2 97%   BMI 45.46 kg/m  BP Readings from Last 3 Encounters:  07/06/23 108/60  03/08/23 (!) 109/57  02/16/23 134/78   Wt Readings from Last 3 Encounters:  07/06/23 273 lb 3.2 oz (123.9 kg)  03/08/23 290 lb 12.8 oz (131.9 kg)  02/16/23 289 lb 9.6 oz (131.4 kg)   SpO2 Readings from Last 3 Encounters:  07/06/23 97%  03/08/23 95%  02/16/23 98%    Physical Exam   No results found for any visits on 07/06/23.  Last CBC Lab Results  Component Value Date   WBC 5.9 03/08/2023   HGB 13.1 03/08/2023   HCT 41.2 03/08/2023   MCV 89.8 03/08/2023   MCH 28.5 03/08/2023   RDW 13.8 03/08/2023   PLT 403 (H) 03/08/2023   Last metabolic panel Lab Results   Component Value Date   GLUCOSE 113 (H) 11/21/2022   NA 136 11/21/2022   K 3.8 11/21/2022   CL 102 11/21/2022   CO2 25 11/21/2022   BUN 13 11/21/2022   CREATININE 1.07 (H) 11/21/2022   GFRNONAA >60 11/21/2022   CALCIUM  8.8 (L) 11/21/2022   PROT 7.6 09/22/2022   ALBUMIN 3.7 09/22/2022   BILITOT 0.7 09/22/2022   ALKPHOS 99 09/22/2022   AST 13 (L) 09/22/2022   ALT 14 09/22/2022   ANIONGAP 9 11/21/2022   Last lipids Lab Results  Component Value Date   CHOL 205 (H) 05/12/2022   HDL 56 05/12/2022   LDLCALC 127 (H) 05/12/2022   TRIG 113 05/12/2022   CHOLHDL 3.7 05/12/2022   Last hemoglobin A1c Lab Results  Component Value Date   HGBA1C 5.8 (H) 07/30/2013   Last thyroid  functions Lab Results  Component Value Date   TSH 1.76 05/12/2022   Last vitamin D  No results found for: 25OHVITD2, 25OHVITD3, VD25OH Last vitamin B12 and Folate No results found for: VITAMINB12, FOLATE    The 10-year ASCVD risk score (Arnett DK, et al., 2019) is: 2.6%    Assessment & Plan:   Problem List Items Addressed This Visit       Unprioritized   Anxiety   Relevant Medications   ALPRAZolam  (XANAX ) 0.25 MG tablet   Preventative health care - Primary   Relevant Orders   CBC with Differential/Platelet   Comprehensive metabolic panel   Lipid panel   TSH   Vitamin B12   VITAMIN D  25 Hydroxy (Vit-D Deficiency, Fractures)   Other Visit Diagnoses       Ptosis of right eyelid       Relevant Orders   Ambulatory referral to Plastic Surgery  Need for influenza vaccination       Relevant Orders   Flu vaccine trivalent PF, 6mos and older(Flulaval,Afluria,Fluarix ,Fluzone)     Lower extremity edema       Relevant Medications   hydrochlorothiazide  (HYDRODIURIL ) 25 MG tablet     Hyperglycemia       Relevant Medications   tirzepatide  (ZEPBOUND ) 2.5 MG/0.5ML Pen   Other Relevant Orders   Hemoglobin A1c     Family history of diabetes mellitus       Relevant Medications    tirzepatide  (ZEPBOUND ) 2.5 MG/0.5ML Pen     Family history of TIAs       Relevant Medications   tirzepatide  (ZEPBOUND ) 2.5 MG/0.5ML Pen     Assessment and Plan    Fluid in Ear Reports feeling underwater with hearing difficulty. Examination indicates fluid in the ear. Use Flonase  nasal spray and take an antihistamine like Claritin or Zyrtec . If symptoms persist, consider adding Astepro nasal spray.  Swelling in Ankles Experiences intermittent ankle swelling, especially in the morning after wearing socks overnight. Has a history of hairline fractures in both ankles and recently changed sneaker style. Prescribe a diuretic as needed for swelling. Recommend wearing compression socks during flights and taking aspirin on the day of the flight to prevent clots.  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 Refer to a plastic surgeon for evaluation and potential correction.  Anxiety Increased anxiety due to a project management class. Manages with ibuprofen  and early sleep as needed. Continue current management with ibuprofen .  Weight Management Interested in weight loss. Prescribe Zepbound , starting with the lowest dose and adjusting as needed. Emphasize adequate protein intake to prevent muscle loss and sagging skin. The medication is a weekly injection with a small, generally painless needle.  General Health Maintenance Up-to-date with vitamins and supplements. Reports improved energy and less cold sensitivity with iron gummies. Upcoming eye doctor and dentist appointments. Administer flu shot today. Consider pneumonia vaccine for patients 50 and over. Advise wearing a mask and using hand sanitizer during travel.  Follow-up Follow up with the eye doctor next week and the dentist on the 18th. Check for flu shot availability downstairs if not available in the clinic.        No follow-ups on file.    Amey Hossain R Lowne Chase, DO

## 2023-07-07 LAB — CBC WITH DIFFERENTIAL/PLATELET
Absolute Lymphocytes: 2460 {cells}/uL (ref 850–3900)
Absolute Monocytes: 431 {cells}/uL (ref 200–950)
Basophils Absolute: 71 {cells}/uL (ref 0–200)
Basophils Relative: 1.2 %
Eosinophils Absolute: 83 {cells}/uL (ref 15–500)
Eosinophils Relative: 1.4 %
HCT: 40.6 % (ref 35.0–45.0)
Hemoglobin: 13.2 g/dL (ref 11.7–15.5)
MCH: 28.1 pg (ref 27.0–33.0)
MCHC: 32.5 g/dL (ref 32.0–36.0)
MCV: 86.6 fL (ref 80.0–100.0)
MPV: 9.6 fL (ref 7.5–12.5)
Monocytes Relative: 7.3 %
Neutro Abs: 2856 {cells}/uL (ref 1500–7800)
Neutrophils Relative %: 48.4 %
Platelets: 421 10*3/uL — ABNORMAL HIGH (ref 140–400)
RBC: 4.69 10*6/uL (ref 3.80–5.10)
RDW: 13.9 % (ref 11.0–15.0)
Total Lymphocyte: 41.7 %
WBC: 5.9 10*3/uL (ref 3.8–10.8)

## 2023-07-07 LAB — TSH: TSH: 2.77 m[IU]/L (ref 0.40–4.50)

## 2023-07-07 LAB — COMPREHENSIVE METABOLIC PANEL
AG Ratio: 1.2 (calc) (ref 1.0–2.5)
ALT: 13 U/L (ref 6–29)
AST: 12 U/L (ref 10–35)
Albumin: 3.8 g/dL (ref 3.6–5.1)
Alkaline phosphatase (APISO): 114 U/L (ref 37–153)
BUN: 13 mg/dL (ref 7–25)
CO2: 26 mmol/L (ref 20–32)
Calcium: 9.3 mg/dL (ref 8.6–10.4)
Chloride: 104 mmol/L (ref 98–110)
Creat: 0.78 mg/dL (ref 0.50–1.03)
Globulin: 3.1 g/dL (ref 1.9–3.7)
Glucose, Bld: 94 mg/dL (ref 65–99)
Potassium: 4.1 mmol/L (ref 3.5–5.3)
Sodium: 140 mmol/L (ref 135–146)
Total Bilirubin: 0.6 mg/dL (ref 0.2–1.2)
Total Protein: 6.9 g/dL (ref 6.1–8.1)

## 2023-07-07 LAB — LIPID PANEL
Cholesterol: 236 mg/dL — ABNORMAL HIGH (ref <200)
HDL: 57 mg/dL (ref >=50)
LDL Cholesterol (Calc): 155 mg/dL — ABNORMAL HIGH
Non-HDL Cholesterol (Calc): 179 mg/dL — ABNORMAL HIGH (ref <130)
Total CHOL/HDL Ratio: 4.1 (calc) (ref <5.0)
Triglycerides: 120 mg/dL (ref <150)

## 2023-07-07 LAB — HEMOGLOBIN A1C
Hgb A1c MFr Bld: 6.8 %{Hb} — ABNORMAL HIGH (ref <5.7)
Mean Plasma Glucose: 148 mg/dL
eAG (mmol/L): 8.2 mmol/L

## 2023-07-07 LAB — VITAMIN D 25 HYDROXY (VIT D DEFICIENCY, FRACTURES): Vit D, 25-Hydroxy: 28 ng/mL — ABNORMAL LOW (ref 30–100)

## 2023-07-07 LAB — VITAMIN B12: Vitamin B-12: 678 pg/mL (ref 200–1100)

## 2023-07-09 ENCOUNTER — Encounter: Payer: Self-pay | Admitting: Family Medicine

## 2023-10-16 ENCOUNTER — Telehealth: Admitting: Physician Assistant

## 2023-10-16 DIAGNOSIS — J019 Acute sinusitis, unspecified: Secondary | ICD-10-CM | POA: Diagnosis not present

## 2023-10-16 DIAGNOSIS — B9689 Other specified bacterial agents as the cause of diseases classified elsewhere: Secondary | ICD-10-CM

## 2023-10-16 MED ORDER — FLUCONAZOLE 150 MG PO TABS
ORAL_TABLET | ORAL | 0 refills | Status: DC
Start: 2023-10-16 — End: 2024-04-03

## 2023-10-16 MED ORDER — BENZONATATE 100 MG PO CAPS
100.0000 mg | ORAL_CAPSULE | Freq: Three times a day (TID) | ORAL | 0 refills | Status: DC | PRN
Start: 2023-10-16 — End: 2024-04-03

## 2023-10-16 MED ORDER — AMOXICILLIN-POT CLAVULANATE 875-125 MG PO TABS
1.0000 | ORAL_TABLET | Freq: Two times a day (BID) | ORAL | 0 refills | Status: DC
Start: 2023-10-16 — End: 2024-04-03

## 2023-10-16 NOTE — Progress Notes (Signed)
 E-Visit for Sinus Problems  We are sorry that you are not feeling well.  Here is how we plan to help!  Based on what you have shared with me it looks like you have sinusitis.  Sinusitis is inflammation and infection in the sinus cavities of the head.  Based on your presentation I believe you most likely have Acute Bacterial Sinusitis.  This is an infection caused by bacteria and is treated with antibiotics. I have prescribed Augmentin  875mg /125mg  one tablet twice daily with food, for 7 days. I have also sent in a script for Tessalon  to use as directed for cough.  You may use an oral decongestant such as Mucinex  D or if you have glaucoma or high blood pressure use plain Mucinex . Saline nasal spray help and can safely be used as often as needed for congestion.  If you develop worsening sinus pain, fever or notice severe headache and vision changes, or if symptoms are not better after completion of antibiotic, please schedule an appointment with a health care provider.    Sinus infections are not as easily transmitted as other respiratory infection, however we still recommend that you avoid close contact with loved ones, especially the very young and elderly.  Remember to wash your hands thoroughly throughout the day as this is the number one way to prevent the spread of infection!  Home Care: Only take medications as instructed by your medical team. Complete the entire course of an antibiotic. Do not take these medications with alcohol. A steam or ultrasonic humidifier can help congestion.  You can place a towel over your head and breathe in the steam from hot water coming from a faucet. Avoid close contacts especially the very young and the elderly. Cover your mouth when you cough or sneeze. Always remember to wash your hands.  Get Help Right Away If: You develop worsening fever or sinus pain. You develop a severe head ache or visual changes. Your symptoms persist after you have completed your  treatment plan.  Make sure you Understand these instructions. Will watch your condition. Will get help right away if you are not doing well or get worse.  Thank you for choosing an e-visit.  Your e-visit answers were reviewed by a board certified advanced clinical practitioner to complete your personal care plan. Depending upon the condition, your plan could have included both over the counter or prescription medications.  Please review your pharmacy choice. Make sure the pharmacy is open so you can pick up prescription now. If there is a problem, you may contact your provider through [employer] and have the prescription routed to another pharmacy.  Your safety is important to us . If you have drug allergies check your prescription carefully.   For the next 24 hours you can use MyChart to ask questions about today's visit, request a non-urgent call back, or ask for a work or school excuse. You will get an email in the next two days asking about your experience. I hope that your e-visit has been valuable and will speed your recovery.

## 2023-10-16 NOTE — Progress Notes (Signed)
 I have spent 5 minutes in review of e-visit questionnaire, review and updating patient chart, medical decision making and response to patient.   Piedad Climes, PA-C

## 2024-04-02 ENCOUNTER — Ambulatory Visit: Payer: Self-pay

## 2024-04-02 NOTE — Telephone Encounter (Signed)
 FYI Only or Action Required?: FYI only for provider: appointment scheduled on 04/03/24 at [facility].  Patient was last seen in primary care on 07/06/2023 by Antonio Meth, Jamee SAUNDERS, DO.  Called Nurse Triage reporting Finger Injury.  Symptoms began a week ago.  Interventions attempted: OTC medications: ibuprofen  and Rest, hydration, or home remedies.  Symptoms are: unchanged.  Triage Disposition: See PCP When Office is Open (Within 3 Days)  Patient/caregiver understands and will follow disposition?: Yes  Copied from CRM #[id number]. Topic: Clinical - Red Word Triage >> Apr 02, 2024  2:00 PM Aisha D wrote: Red Word that prompted transfer to Nurse Triage: pain, swelling, broke finger  Pt stated that she thinks her right thumb may be broken and wants to schedule an appt with the provider. Pt stated that she can hardly move her thumb and is experiencing pain and swelling. Reason for Disposition  [1] After 3 days AND [2] pain not improved  Answer Assessment - Initial Assessment Questions 1. MECHANISM: How did the injury happen?      Picking up an object and felt/heard a cracking sound 2. ONSET: When did the injury happen? (e.g., minutes, hours ago)      One week ago 3. LOCATION: What part of the finger is injured? Is the nail damaged?     right thumb 4. APPEARANCE of the INJURY: What does the injury look like?      Swelling, but reduced 5. SEVERITY: Can you use the hand normally?  Can you bend your fingers into a ball and then fully open them?     Decreased strength and ROM 7. PAIN: Is there pain? If Yes, ask: How bad is the pain?  (Scale 0-10; or none, mild, moderate, severe)     6/10  Protocols used: Finger Injury-A-AH

## 2024-04-02 NOTE — Telephone Encounter (Signed)
 Appt scheduled for tomorrow at [facility]

## 2024-04-03 ENCOUNTER — Encounter: Payer: Self-pay | Admitting: Family Medicine

## 2024-04-03 ENCOUNTER — Ambulatory Visit (INDEPENDENT_AMBULATORY_CARE_PROVIDER_SITE_OTHER)

## 2024-04-03 ENCOUNTER — Ambulatory Visit: Payer: Self-pay | Admitting: Family Medicine

## 2024-04-03 VITALS — BP 126/72 | HR 83 | Temp 98.4°F | Ht 65.0 in | Wt 295.0 lb

## 2024-04-03 DIAGNOSIS — S66811A Strain of other specified muscles, fascia and tendons at wrist and hand level, right hand, initial encounter: Secondary | ICD-10-CM

## 2024-04-03 NOTE — Progress Notes (Signed)
 Established Patient Office Visit   Subjective:  Patient ID: Jenny Cohen, female    DOB: [birth year]  Age: 58 y.o. MRN: [id number]  Chief Complaint  Patient presents with   Hand Pain    Right thumb pain due to injury I week ago. Pt state she went to grab something and heard a crack. Since her right thumb has been, painful with edema, warmth and limit mobility. Pt is currently using Ibuprofen  and icing.     Hand Pain  Pertinent negatives include no tingling.   Encounter Diagnoses  Name Primary?   Strain of metacarpophalangeal joint of right hand, initial encounter Yes   Developed right thumb pain after reaching for a blanket on her bed that had a hand weight wrapped up in the blanket.  She has had pain in her ever since.  Limited range of motion.  Right-hand-dominant.  No other traumatic injury to the thumb.   Review of Systems  Constitutional: Negative.   HENT: Negative.    Eyes:  Negative for blurred vision, discharge and redness.  Respiratory: Negative.    Cardiovascular: Negative.   Gastrointestinal:  Negative for abdominal pain.  Genitourinary: Negative.   Musculoskeletal:  Positive for joint pain. Negative for myalgias.  Skin:  Negative for rash.  Neurological:  Negative for tingling, loss of consciousness and weakness.  Endo/Heme/Allergies:  Negative for polydipsia.     Current Outpatient Medications:    ALPRAZolam  (XANAX ) 0.25 MG tablet, TAKE 1 TABLET(0.25 MG) BY MOUTH THREE TIMES DAILY AS NEEDED FOR ANXIETY, Disp: 30 tablet, Rfl: 1   cholecalciferol (VITAMIN D3) 25 MCG (1000 UNIT) tablet, Take 1,000 Units by mouth daily., Disp: , Rfl:    Ferrous Sulfate  (IRON PO), Take 1 tablet by mouth daily., Disp: , Rfl:    fluticasone  (FLONASE ) 50 MCG/ACT nasal spray, Place 2 sprays into both nostrils daily., Disp: 16 g, Rfl: 6   hydrochlorothiazide  (HYDRODIURIL ) 25 MG tablet, Take 1 tablet (25 mg total) by mouth daily., Disp: 90 tablet, Rfl: 3   ibuprofen  (ADVIL ) 800 MG  tablet, Take 1 tablet (800 mg total) by mouth every 8 (eight) hours as needed., Disp: 30 tablet, Rfl: 0   vitamin B-12 (CYANOCOBALAMIN ) 100 MCG tablet, Take 100 mcg by mouth daily., Disp: , Rfl:    amoxicillin -clavulanate (AUGMENTIN ) 875-125 MG tablet, Take 1 tablet by mouth 2 (two) times daily., Disp: 14 tablet, Rfl: 0   benzonatate  (TESSALON ) 100 MG capsule, Take 1 capsule (100 mg total) by mouth 3 (three) times daily as needed for cough. (Patient not taking: Reported on 04/03/2024), Disp: 30 capsule, Rfl: 0   fluconazole  (DIFLUCAN ) 150 MG tablet, Take 1 tablet PO once. Repeat in 3 days if needed., Disp: 2 tablet, Rfl: 0   promethazine -dextromethorphan (PROMETHAZINE -DM) 6.25-15 MG/5ML syrup, Take 5 mLs by mouth 4 (four) times daily as needed. (Patient not taking: Reported on 04/03/2024), Disp: 118 mL, Rfl: 0   tirzepatide  (ZEPBOUND ) 2.5 MG/0.5ML Pen, Inject 2.5 mg into the skin once a week. (Patient not taking: Reported on 04/03/2024), Disp: 0.5 mL, Rfl: 0   Objective:     BP 126/72 (BP Location: Right Arm, Patient Position: Sitting, Cuff Size: Large)   Pulse 83   Temp 98.4 F (36.9 C) (Temporal)   Ht 5' 5 (1.651 m)   Wt 295 lb (133.8 kg)   LMP 07/14/2013   SpO2 96%   BMI 49.09 kg/m    Physical Exam Constitutional:      General: She is not in acute  distress.    Appearance: Normal appearance. She is not ill-appearing, toxic-appearing or diaphoretic.  HENT:     Head: Normocephalic and atraumatic.     Right Ear: External ear normal.     Left Ear: External ear normal.  Eyes:     General: No scleral icterus.       Right eye: No discharge.        Left eye: No discharge.     Extraocular Movements: Extraocular movements intact.     Conjunctiva/sclera: Conjunctivae normal.  Pulmonary:     Effort: Pulmonary effort is normal. No respiratory distress.  Musculoskeletal:     Right hand: Tenderness present. Decreased range of motion.     Comments: Tenderness to palpation of the lateral MCP  of the first finger.  There was pain with strain of the associated ligament.  Opposes digits.  Skin:    General: Skin is warm and dry.  Neurological:     Mental Status: She is alert and oriented to person, place, and time.  Psychiatric:        Mood and Affect: Mood normal.        Behavior: Behavior normal.      No results found for any visits on 04/03/24.    The 10-year ASCVD risk score (Arnett DK, et al., 2019) is: 5.1%    Assessment & Plan:   Strain of metacarpophalangeal joint of right hand, initial encounter -     DG Finger Thumb Right; Future    No follow-ups on file.  Continue ibuprofen .  Recommended wrist splint with a spica thumb.  Continue ibuprofen  as needed.  Sports medicine referral if not improving in a week.  Elsie Sim Lent, MD

## 2024-04-08 ENCOUNTER — Ambulatory Visit: Payer: Self-pay | Admitting: Family Medicine

## 2024-07-07 ENCOUNTER — Encounter: Payer: Self-pay | Admitting: Family Medicine

## 2024-07-14 ENCOUNTER — Encounter: Payer: Self-pay | Admitting: Family Medicine

## 2024-07-28 ENCOUNTER — Encounter: Payer: Self-pay | Admitting: Family Medicine
